# Patient Record
Sex: Male | Born: 1960
Health system: Southern US, Community
[De-identification: ages and names within clinical notes are randomized; demographics above are authoritative.]

## PROBLEM LIST (undated history)

## (undated) DIAGNOSIS — B1920 Unspecified viral hepatitis C without hepatic coma: Secondary | ICD-10-CM

## (undated) DIAGNOSIS — I10 Essential (primary) hypertension: Secondary | ICD-10-CM

## (undated) DIAGNOSIS — E119 Type 2 diabetes mellitus without complications: Secondary | ICD-10-CM

## (undated) DIAGNOSIS — F329 Major depressive disorder, single episode, unspecified: Secondary | ICD-10-CM

## (undated) DIAGNOSIS — E785 Hyperlipidemia, unspecified: Secondary | ICD-10-CM

## (undated) DIAGNOSIS — F32A Depression, unspecified: Secondary | ICD-10-CM

## (undated) DIAGNOSIS — Z87442 Personal history of urinary calculi: Secondary | ICD-10-CM

## (undated) DIAGNOSIS — M545 Low back pain: Secondary | ICD-10-CM

## (undated) HISTORY — PX: SHOULDER SURGERY: SHX246

## (undated) HISTORY — DX: Unspecified viral hepatitis C without hepatic coma: B19.20

## (undated) HISTORY — DX: Type 2 diabetes mellitus without complications: E11.9

## (undated) HISTORY — PX: APPENDECTOMY: SHX54

## (undated) HISTORY — DX: Low back pain: M54.5

## (undated) HISTORY — DX: Hyperlipidemia, unspecified: E78.5

## (undated) HISTORY — DX: Depression, unspecified: F32.A

## (undated) HISTORY — PX: BACK SURGERY: SHX140

## (undated) HISTORY — DX: Major depressive disorder, single episode, unspecified: F32.9

## (undated) HISTORY — DX: Personal history of urinary calculi: Z87.442

## (undated) HISTORY — DX: Essential (primary) hypertension: I10

---

## 1999-01-09 ENCOUNTER — Ambulatory Visit (HOSPITAL_BASED_OUTPATIENT_CLINIC_OR_DEPARTMENT_OTHER): Admission: RE | Admit: 1999-01-09 | Discharge: 1999-01-09 | Payer: Self-pay | Admitting: Surgery

## 2002-09-14 ENCOUNTER — Encounter (INDEPENDENT_AMBULATORY_CARE_PROVIDER_SITE_OTHER): Payer: Self-pay | Admitting: *Deleted

## 2002-09-14 ENCOUNTER — Ambulatory Visit (HOSPITAL_COMMUNITY): Admission: RE | Admit: 2002-09-14 | Discharge: 2002-09-14 | Payer: Self-pay | Admitting: *Deleted

## 2004-07-24 ENCOUNTER — Ambulatory Visit (HOSPITAL_BASED_OUTPATIENT_CLINIC_OR_DEPARTMENT_OTHER): Admission: RE | Admit: 2004-07-24 | Discharge: 2004-07-24 | Payer: Self-pay | Admitting: Orthopedic Surgery

## 2004-07-24 ENCOUNTER — Ambulatory Visit (HOSPITAL_COMMUNITY): Admission: RE | Admit: 2004-07-24 | Discharge: 2004-07-24 | Payer: Self-pay | Admitting: Orthopedic Surgery

## 2006-07-04 ENCOUNTER — Ambulatory Visit: Payer: Self-pay | Admitting: Internal Medicine

## 2007-03-16 DIAGNOSIS — M545 Low back pain, unspecified: Secondary | ICD-10-CM

## 2007-03-16 HISTORY — DX: Low back pain, unspecified: M54.50

## 2007-06-17 ENCOUNTER — Emergency Department (HOSPITAL_COMMUNITY): Admission: EM | Admit: 2007-06-17 | Discharge: 2007-06-17 | Payer: Self-pay | Admitting: Emergency Medicine

## 2007-06-19 ENCOUNTER — Ambulatory Visit: Payer: Self-pay | Admitting: Internal Medicine

## 2007-06-19 DIAGNOSIS — M79609 Pain in unspecified limb: Secondary | ICD-10-CM

## 2007-06-19 LAB — CONVERTED CEMR LAB
Basophils Relative: 0 % (ref 0.0–1.0)
CO2: 29 meq/L (ref 19–32)
Calcium: 9.1 mg/dL (ref 8.4–10.5)
Creatinine, Ser: 1.1 mg/dL (ref 0.4–1.5)
Eosinophils Relative: 3 % (ref 0.0–5.0)
GFR calc non Af Amer: 76 mL/min
Glucose, Bld: 115 mg/dL — ABNORMAL HIGH (ref 70–99)
Hemoglobin: 16.9 g/dL (ref 13.0–17.0)
Ketones, ur: NEGATIVE mg/dL
Leukocytes, UA: NEGATIVE
Lymphocytes Relative: 25.5 % (ref 12.0–46.0)
Monocytes Relative: 3 % (ref 3.0–12.0)
Neutro Abs: 8.6 10*3/uL — ABNORMAL HIGH (ref 1.4–7.7)
Nitrite: NEGATIVE
RBC: 5.33 M/uL (ref 4.22–5.81)
Specific Gravity, Urine: <=1.005 (ref 1.000–1.03)
WBC: 12.6 10*3/uL — ABNORMAL HIGH (ref 4.5–10.5)
pH: 6 (ref 5.0–8.0)

## 2007-06-27 ENCOUNTER — Ambulatory Visit: Payer: Self-pay | Admitting: Cardiology

## 2007-06-28 ENCOUNTER — Ambulatory Visit: Payer: Self-pay | Admitting: Internal Medicine

## 2007-06-28 DIAGNOSIS — M545 Low back pain: Secondary | ICD-10-CM

## 2007-07-05 ENCOUNTER — Encounter: Admission: RE | Admit: 2007-07-05 | Discharge: 2007-07-05 | Payer: Self-pay | Admitting: Internal Medicine

## 2007-07-12 ENCOUNTER — Ambulatory Visit: Payer: Self-pay | Admitting: Internal Medicine

## 2007-07-28 ENCOUNTER — Ambulatory Visit: Payer: Self-pay | Admitting: Internal Medicine

## 2007-08-14 ENCOUNTER — Telehealth: Payer: Self-pay | Admitting: Internal Medicine

## 2007-08-15 ENCOUNTER — Ambulatory Visit: Payer: Self-pay | Admitting: Internal Medicine

## 2007-08-15 LAB — CONVERTED CEMR LAB
Ketones, ur: NEGATIVE mg/dL
Leukocytes, UA: NEGATIVE
Nitrite: NEGATIVE
Specific Gravity, Urine: <=1.005 (ref 1.000–1.03)
Urobilinogen, UA: 0.2 (ref 0.0–1.0)

## 2007-08-17 LAB — CONVERTED CEMR LAB
Creatinine,U: 24.9 mg/dL
Opiate Screen, Urine: NEGATIVE
Phencyclidine (PCP): NEGATIVE
Propoxyphene: NEGATIVE

## 2007-08-22 ENCOUNTER — Encounter: Payer: Self-pay | Admitting: Internal Medicine

## 2007-12-04 ENCOUNTER — Ambulatory Visit: Payer: Self-pay | Admitting: Internal Medicine

## 2007-12-04 DIAGNOSIS — R05 Cough: Secondary | ICD-10-CM

## 2007-12-05 ENCOUNTER — Encounter: Payer: Self-pay | Admitting: Internal Medicine

## 2008-04-24 ENCOUNTER — Encounter: Payer: Self-pay | Admitting: Internal Medicine

## 2008-04-24 ENCOUNTER — Ambulatory Visit: Payer: Self-pay | Admitting: Internal Medicine

## 2008-04-24 DIAGNOSIS — D485 Neoplasm of uncertain behavior of skin: Secondary | ICD-10-CM

## 2009-01-29 ENCOUNTER — Ambulatory Visit: Payer: Self-pay | Admitting: Internal Medicine

## 2009-01-29 DIAGNOSIS — R350 Frequency of micturition: Secondary | ICD-10-CM

## 2009-01-29 DIAGNOSIS — N320 Bladder-neck obstruction: Secondary | ICD-10-CM

## 2009-01-29 LAB — CONVERTED CEMR LAB: Blood Glucose, Fingerstick: 113

## 2009-01-30 LAB — CONVERTED CEMR LAB
BUN: 8 mg/dL (ref 6–23)
Bilirubin Urine: NEGATIVE
Creatinine, Ser: 0.8 mg/dL (ref 0.4–1.5)
GFR calc non Af Amer: 108.39 mL/min (ref >60)
Hemoglobin, Urine: NEGATIVE
Leukocytes, UA: NEGATIVE
Nitrite: NEGATIVE
Potassium: 3.9 meq/L (ref 3.5–5.1)
pH: 6.5 (ref 5.0–8.0)

## 2009-03-21 ENCOUNTER — Encounter: Payer: Self-pay | Admitting: Internal Medicine

## 2009-04-07 IMAGING — CR DG CHEST 2V
2 series · 2 of 2 positions shown · non-contrast
Comparison: none

CLINICAL DATA: Cough, short of breath.
 CHEST - 2 VIEW:
 No comparison.

[view not recorded (1 of 2)]
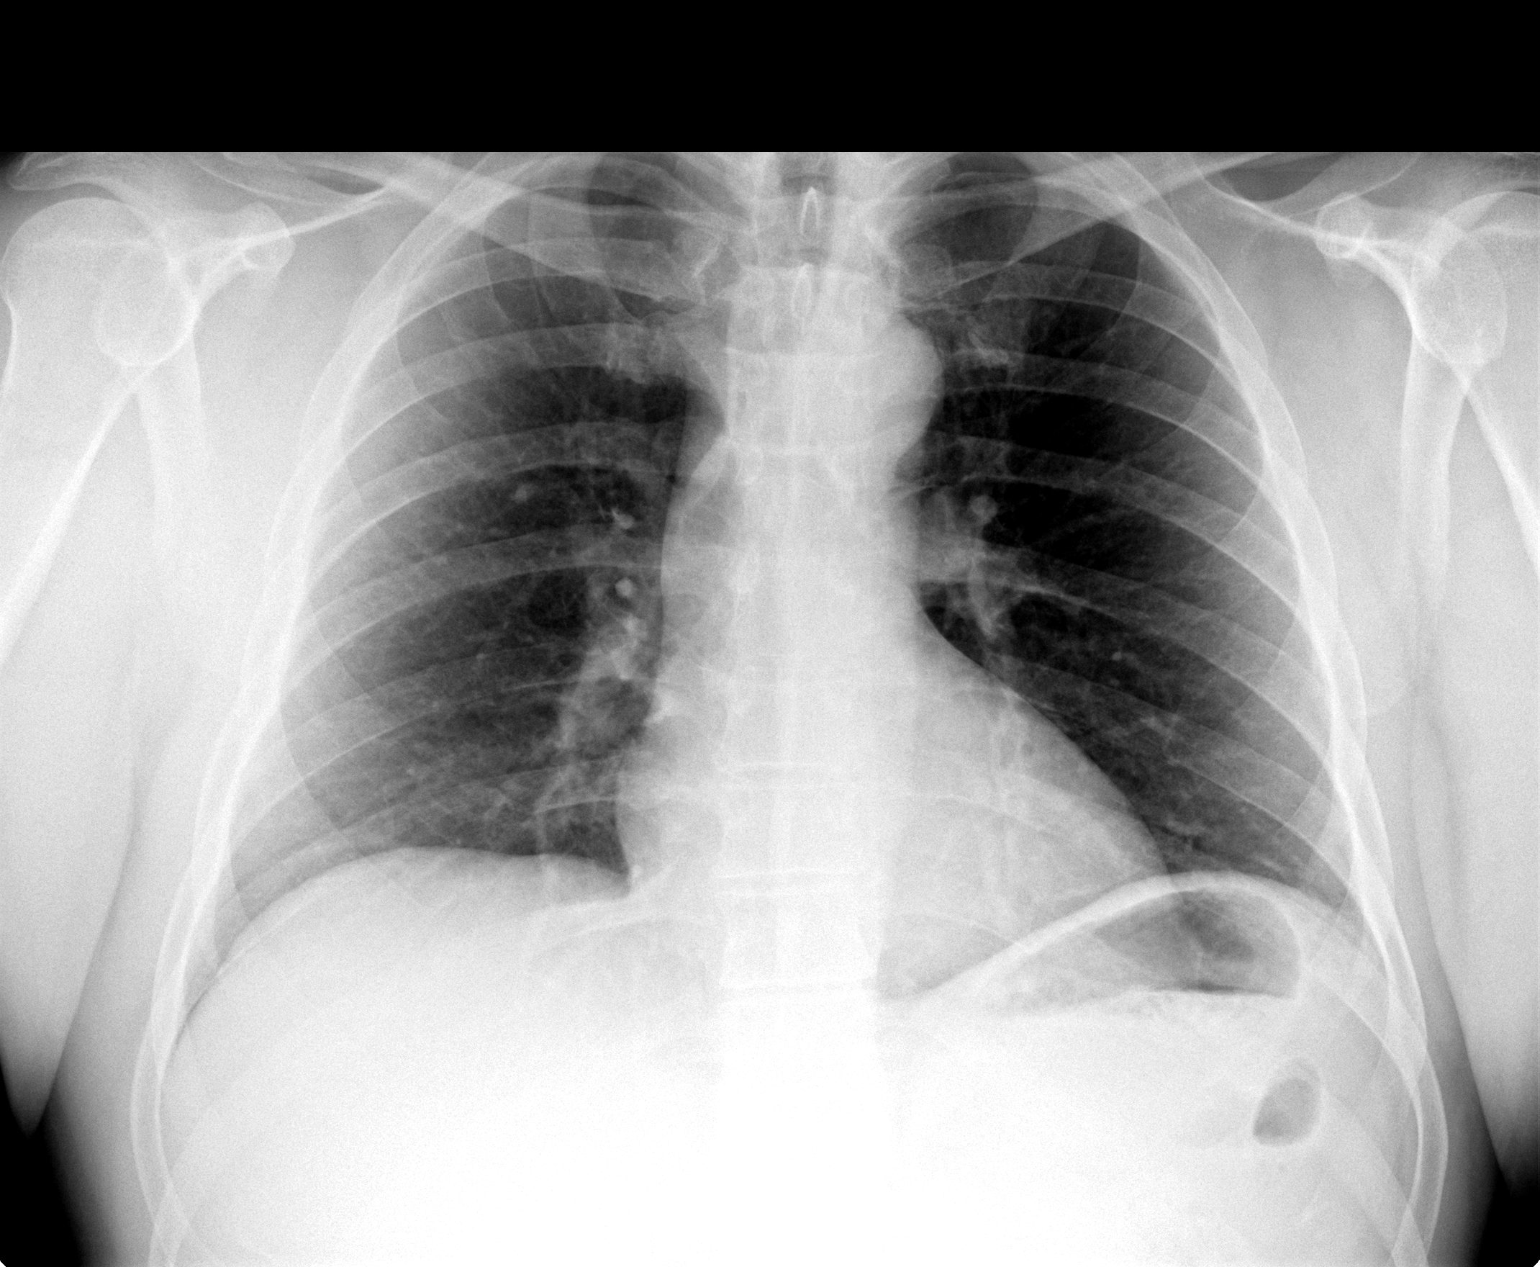

[view not recorded (2 of 2)]
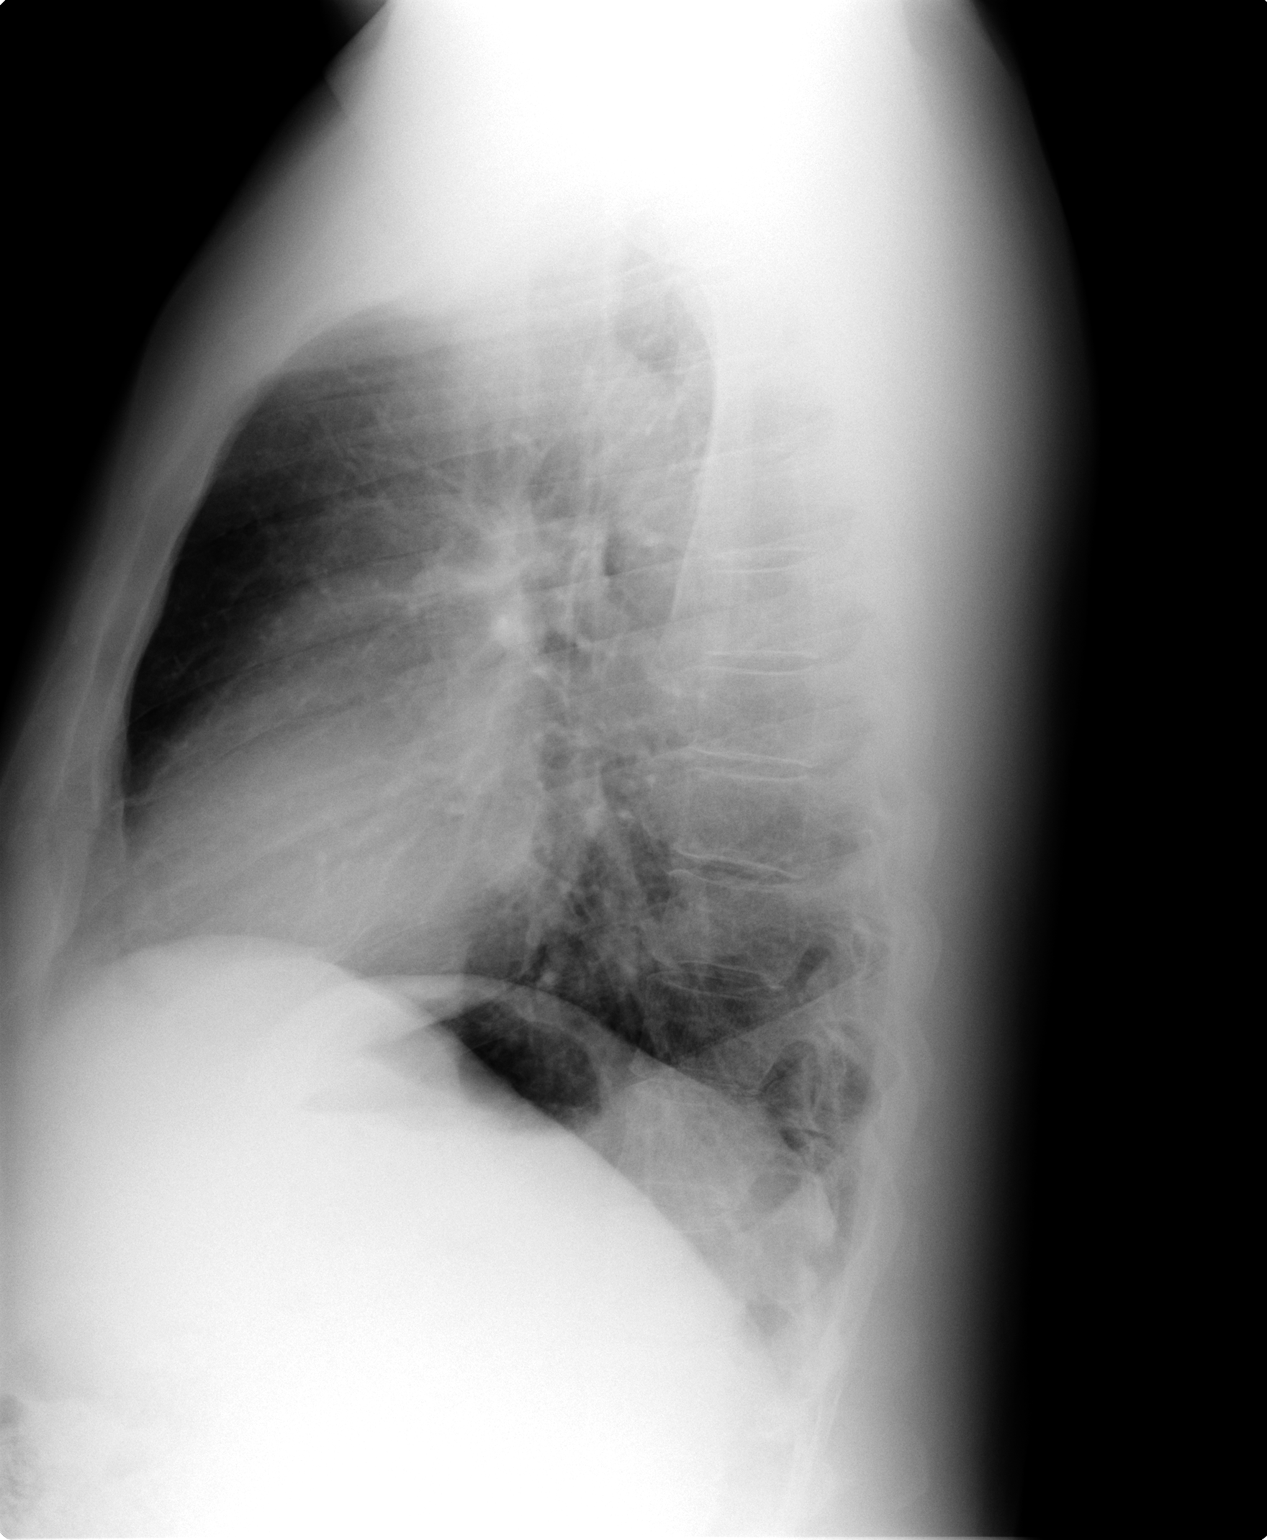

[2 of 2 positions shown; findings below may reference images not displayed]

FINDINGS: The heart size and mediastinal contours are within normal limits.  Both lungs are clear.  The visualized skeletal structures are within normal limits.
IMPRESSION: No active cardiopulmonary disease.

## 2009-05-02 ENCOUNTER — Telehealth (INDEPENDENT_AMBULATORY_CARE_PROVIDER_SITE_OTHER): Payer: Self-pay | Admitting: *Deleted

## 2009-10-02 ENCOUNTER — Ambulatory Visit: Payer: Self-pay | Admitting: Family Medicine

## 2009-10-13 ENCOUNTER — Ambulatory Visit (HOSPITAL_COMMUNITY): Admission: RE | Admit: 2009-10-13 | Discharge: 2009-10-13 | Payer: Self-pay | Admitting: Family Medicine

## 2010-03-04 ENCOUNTER — Ambulatory Visit: Payer: Self-pay | Admitting: Internal Medicine

## 2010-04-14 NOTE — Progress Notes (Signed)
Summary: Records request  Records request received from[facility] via the fax machine. Forwarded to [facility] for processing. 

## 2010-04-14 NOTE — Letter (Signed)
Summary: [facility]   Imported By: Lester Will 04/02/2009 08:43:17  _____________________________________________________________________  External Attachment:    Type:   Image     Comment:   External Document

## 2010-07-31 NOTE — Assessment & Plan Note (Signed)
[facility]                           PRIMARY CARE OFFICE NOTE   NAME:Ethan Ellis, Ethan Ellis                       MRN:          [id number]  DATE:07/04/2006                            DOB:          13-Jun-1960    The patient is a 50 year old new patient who presents with his wife [name]  to establish primary with me. He complains of being sick with a  respiratory illness since Easter Sunday. There has been a lot of cough,  congestion, chest pain with coughing, shortness of breath. He was  treated at Urgent Care with Augmentin, but he finished last Tuesday  Vicodin and he is not any better.   ALLERGIES:  ALEVE AND NAPROSYN.   SOCIAL HISTORY:  He smokes one pack a day. He works for [employer]. Limited alcohol. He is married.   FAMILY HISTORY:  Negative for asthma.   PAST MEDICAL HISTORY:  Unremarkable except for a history of asthma as a  child.   REVIEW OF SYSTEMS:  No weight loss. No hemoptysis. No nausea, vomiting  or diarrhea. No weight loss. No skin rashes. The rest of an 18-point  review of systems is negative.   PHYSICAL EXAMINATION:  Blood pressure 129/86, pulse 73, temperature  99.3, weight 223 pounds. He is in no acute distress, coughing all the  time.  HEENT: With erythematous nasal mucosa and throat. There was a large  amount of wax in the left ear. It was removed with a loop.  NECK: Supple. No thyromegaly or bruit.  LUNGS:  With bilateral rhonchi and crackles at bases.  HEART: S1, S2. Regular.  No gallop.  ABDOMEN: Soft and nontender. No organomegaly or mass felt.  EXTREMITIES: Lower extremities without edema. Calves nontender.  NEURO: He is alert and cooperative. Denies being depressed.   LABORATORY DATA:  EKG was normal sinus rhythm.   ASSESSMENT/PLAN:  1. Possible pneumonia, refractory to treatment. Obtain chest x-ray,      prescribed Avelox 400 mg daily for 10 days.  2. Asthmatic bronchitis, exacerbated by problem #1.  Prednisone 40 mg      daily for five days and      20  mg daily for 5 days, 10 mg daily for 5 days. He will take over-      the-counter Prilosec two a      day with it. Prescribed albuterol 4 mg p.o. b.i.d. and Tussionex 5      cc b.i.d. p.r.n.  3. Wax in the left ear, removed with a loop.     Georgina Quint. Plotnikov, MD  Electronically Signed    AVP/MedQ  DD: 07/05/2006  DT: 07/05/2006  Job #: [id number]

## 2010-07-31 NOTE — Op Note (Signed)
Ethan Ellis, Ethan Ellis                ACCOUNT NO.:  [id number]   MEDICAL RECORD NO.:  [id number]          PATIENT TYPE:  AMB   LOCATION:  DSC                          FACILITY:  [facility]   PHYSICIAN:  Feliberto Gottron. Turner Daniels, M.D.   DATE OF BIRTH:  28-Jan-1961   DATE OF PROCEDURE:  07/24/2004  DATE OF DISCHARGE:                                 OPERATIVE REPORT   PREOPERATIVE DIAGNOSIS:  Left radial tunnel syndrome.   POSTOPERATIVE DIAGNOSIS:  Left radial tunnel syndrome.   PROCEDURE:  Left radial tunnel release.   SURGEON:  Feliberto Gottron. Turner Daniels, M.D.   FIRST ASSISTANT:  None.   ANESTHETIC:  General endotracheal.   ESTIMATED BLOOD LOSS:  Minimal.   FLUID REPLACEMENT:  800 mL of crystalloid.   DRAINS PLACED:  None.   TOURNIQUET TIME:  Forty minutes.   INDICATIONS FOR PROCEDURE:  [age]-year-old man followed for a number of  months for left upper extremity pain that did not have a good diagnosis  despite MRI scanning of the neck and I believe the shoulder.  Finally, EMGs  showed an isolated finding of a left radial tunnel syndrome and although the  pain is atypical, it does radiated up and down the arm, it seems be centered  the elbow and he is quite tender over the distal aspect of the radial tunnel  to palpation and does seem to have a positive Tinel's sign at times.  These  findings have been somewhat intermittent, but the EMG findings is definitely  a hard finding.  Because of persistent pain that has failed conservative  treatment, he desires elective radial tunnel exploration and release.  Risks  and benefits are well-understood by the patient and his family.   DESCRIPTION OF PROCEDURE:  The patient was identified by armband and taken  the operating room at [facility].  Appropriate anesthetic  monitors were attached and general endotracheal anesthesia induced with the  patient in the supine position, tourniquet applied high to the left upper  extremity, which was then  prepped and draped in the usual sterile fashion  from the fingertips to the tourniquet.  Starting at the lateral epicondyle  and going distally, a longitudinal incision was then made through the skin  and subcutaneous tissue down to the fascia over the mobile wad of three.  We  split the interval between the brachial radialis and the ECRB to explore the  distal aspect of the radial tunnel, found were it emerged from the supinator  and carefully dissected around it, removing some what seemed to be rather  stiff fibrous bands as it came through the supinator.  It was definitely the  radial nerve when we stimulated it; the muscles fired.  As it came out of  the radial tunnel, it was noted be somewhat bulbous, probably a little bit  more than normal.  Using a small snap, we then explored up the radial tunnel  through the supinator to make sure there was no compression there.  We then  went between the interval of the pronator teres and identified the nerve as  it came down towards the radial tunnel and found no compressing lesions  there.  Satisfied with the decompression, the tourniquet was let down; small  bleeders were then identified and cauterized with the bipolar  electrocautery.  The wound was irrigated out normal saline solution.  The  longitudinal splits that we placed in the fascia were not closed and left  open so as not to compress the nerve.  The subcutaneous tissue was closed  with running 3-0 Vicryl suture and the skin with running interlocking 4-0  nylon suture.  A soft dressing of Xeroform, 4x4 dressing sponges, Webril and  an Ace wrap were then applied.  The patient was placed a standard sling,  awakened and taken to the recovery room without difficulty.      FJR/MEDQ  D:  07/24/2004  T:  07/24/2004  Job:  [id number]

## 2010-12-08 ENCOUNTER — Other Ambulatory Visit (INDEPENDENT_AMBULATORY_CARE_PROVIDER_SITE_OTHER): Payer: Medicare Other

## 2010-12-08 ENCOUNTER — Encounter: Payer: Self-pay | Admitting: Internal Medicine

## 2010-12-08 ENCOUNTER — Ambulatory Visit (INDEPENDENT_AMBULATORY_CARE_PROVIDER_SITE_OTHER): Payer: Medicare Other | Admitting: Internal Medicine

## 2010-12-08 VITALS — BP 100/70 | HR 80 | Temp 98.5°F | Resp 16 | Wt 196.0 lb

## 2010-12-08 DIAGNOSIS — M79609 Pain in unspecified limb: Secondary | ICD-10-CM

## 2010-12-08 DIAGNOSIS — R202 Paresthesia of skin: Secondary | ICD-10-CM

## 2010-12-08 DIAGNOSIS — R209 Unspecified disturbances of skin sensation: Secondary | ICD-10-CM

## 2010-12-08 DIAGNOSIS — M545 Low back pain: Secondary | ICD-10-CM

## 2010-12-08 LAB — COMPREHENSIVE METABOLIC PANEL
AST: 15 U/L (ref 0–37)
Alkaline Phosphatase: 88 U/L (ref 39–117)
BUN: 8 mg/dL (ref 6–23)
Creatinine, Ser: 0.8 mg/dL (ref 0.4–1.5)
Total Bilirubin: 0.8 mg/dL (ref 0.3–1.2)

## 2010-12-08 LAB — CBC WITH DIFFERENTIAL/PLATELET
Basophils Relative: 0.3 % (ref 0.0–3.0)
Eosinophils Absolute: 0.2 10*3/uL (ref 0.0–0.7)
Lymphs Abs: 2.9 10*3/uL (ref 0.7–4.0)
MCHC: 33.3 g/dL (ref 30.0–36.0)
MCV: 96.8 fl (ref 78.0–100.0)
Monocytes Absolute: 1 10*3/uL (ref 0.1–1.0)
Neutrophils Relative %: 70.5 % (ref 43.0–77.0)
RBC: 5.24 Mil/uL (ref 4.22–5.81)

## 2010-12-08 LAB — VITAMIN B12: Vitamin B-12: 220 pg/mL (ref 211–911)

## 2010-12-08 LAB — BASIC METABOLIC PANEL
BUN: 8 mg/dL (ref 6–23)
Chloride: 105 mEq/L (ref 96–112)
Glucose, Bld: 83 mg/dL (ref 70–99)
Potassium: 4.1 mEq/L (ref 3.5–5.1)

## 2010-12-08 MED ORDER — GABAPENTIN 300 MG PO CAPS: 300.0000 mg | ORAL_CAPSULE | Freq: Four times a day (QID) | ORAL | Status: DC

## 2010-12-08 MED ORDER — OXYCODONE-ACETAMINOPHEN 10-325 MG PO TABS: 1.0000 | ORAL_TABLET | Freq: Three times a day (TID) | ORAL | Status: DC | PRN

## 2010-12-08 MED ORDER — OXYCODONE HCL 40 MG PO TB12: 40.0000 mg | ORAL_TABLET | Freq: Two times a day (BID) | ORAL | Status: DC
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
## 2010-12-09 LAB — DRUG SCREEN, URINE
Amphetamine Screen, Ur: POSITIVE — AB
Barbiturate Quant, Ur: NEGATIVE
Benzodiazepines.: NEGATIVE
Cocaine Metabolites: NEGATIVE
Creatinine,U: 292 mg/dL
Marijuana Metabolite: POSITIVE — AB
Methadone: NEGATIVE
Opiates: POSITIVE — AB
Phencyclidine (PCP): NEGATIVE
Propoxyphene: NEGATIVE

## 2010-12-10 ENCOUNTER — Telehealth: Payer: Self-pay | Admitting: Internal Medicine

## 2010-12-14 ENCOUNTER — Encounter: Payer: Self-pay | Admitting: Internal Medicine

## 2010-12-14 NOTE — Assessment & Plan Note (Signed)
Check labs 
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
## 2010-12-14 NOTE — Assessment & Plan Note (Signed)
See Meds 

## 2010-12-22 ENCOUNTER — Ambulatory Visit: Payer: Medicare Other | Admitting: Internal Medicine

## 2010-12-25 ENCOUNTER — Other Ambulatory Visit: Payer: Self-pay | Admitting: *Deleted

## 2010-12-25 ENCOUNTER — Other Ambulatory Visit: Payer: Medicare Other

## 2010-12-25 DIAGNOSIS — Z79899 Other long term (current) drug therapy: Secondary | ICD-10-CM

## 2010-12-26 LAB — DRUG SCREEN, URINE
Amphetamine Screen, Ur: NEGATIVE
Barbiturate Quant, Ur: NEGATIVE
Marijuana Metabolite: NEGATIVE
Methadone: NEGATIVE
Opiates: POSITIVE — AB

## 2010-12-28 ENCOUNTER — Telehealth: Payer: Self-pay | Admitting: *Deleted

## 2010-12-28 NOTE — Telephone Encounter (Signed)
Wife left Vm - she is req results of drug test and wants to know if patient can get his Rx's from Dr Posey Rea.

## 2010-12-29 ENCOUNTER — Other Ambulatory Visit: Payer: Self-pay | Admitting: *Deleted

## 2010-12-29 ENCOUNTER — Telehealth: Payer: Self-pay | Admitting: Internal Medicine

## 2010-12-29 MED ORDER — OXYCODONE HCL 40 MG PO TB12: 40.0000 mg | ORAL_TABLET | Freq: Two times a day (BID) | ORAL | Status: DC

## 2010-12-29 MED ORDER — OXYCODONE-ACETAMINOPHEN 10-325 MG PO TABS: 1.0000 | ORAL_TABLET | Freq: Three times a day (TID) | ORAL | Status: DC | PRN

## 2010-12-29 MED ORDER — GABAPENTIN 300 MG PO CAPS: 300.0000 mg | ORAL_CAPSULE | Freq: Four times a day (QID) | ORAL | Status: DC

## 2010-12-29 NOTE — Telephone Encounter (Signed)
Pt informed Rxs ready for p/u.

## 2010-12-29 NOTE — Telephone Encounter (Signed)
OK to fill this prescriptions for oxycod and oxycont with additional refills x0. RTC 1 mo Thank you!

## 2010-12-29 NOTE — Telephone Encounter (Signed)
done

## 2010-12-29 NOTE — Telephone Encounter (Signed)
See other note Thx 

## 2010-12-29 NOTE — Telephone Encounter (Signed)
Rxs printed for MD sig in Dr. Loren Racer absence.

## 2010-12-30 ENCOUNTER — Ambulatory Visit (INDEPENDENT_AMBULATORY_CARE_PROVIDER_SITE_OTHER): Payer: Medicare Other | Admitting: Internal Medicine

## 2010-12-30 ENCOUNTER — Encounter: Payer: Self-pay | Admitting: Internal Medicine

## 2010-12-30 ENCOUNTER — Other Ambulatory Visit (INDEPENDENT_AMBULATORY_CARE_PROVIDER_SITE_OTHER): Payer: Medicare Other

## 2010-12-30 ENCOUNTER — Ambulatory Visit (INDEPENDENT_AMBULATORY_CARE_PROVIDER_SITE_OTHER)
Admission: RE | Admit: 2010-12-30 | Discharge: 2010-12-30 | Disposition: A | Discharge status: Home / Self Care | Payer: Medicare Other | Source: Ambulatory Visit | Attending: Internal Medicine | Admitting: Internal Medicine

## 2010-12-30 VITALS — BP 110/78 | HR 79 | Temp 97.4°F | Ht 66.0 in | Wt 199.0 lb

## 2010-12-30 DIAGNOSIS — J209 Acute bronchitis, unspecified: Secondary | ICD-10-CM

## 2010-12-30 DIAGNOSIS — R5383 Other fatigue: Secondary | ICD-10-CM | POA: Insufficient documentation

## 2010-12-30 DIAGNOSIS — R5381 Other malaise: Secondary | ICD-10-CM

## 2010-12-30 DIAGNOSIS — F17200 Nicotine dependence, unspecified, uncomplicated: Secondary | ICD-10-CM

## 2010-12-30 LAB — CBC WITH DIFFERENTIAL/PLATELET
Basophils Absolute: 0.1 10*3/uL (ref 0.0–0.1)
Eosinophils Absolute: 0.4 10*3/uL (ref 0.0–0.7)
Lymphs Abs: 2.9 10*3/uL (ref 0.7–4.0)
MCHC: 34.1 g/dL (ref 30.0–36.0)
MCV: 94.8 fl (ref 78.0–100.0)
Monocytes Absolute: 0.7 10*3/uL (ref 0.1–1.0)
Neutrophils Relative %: 69.1 % (ref 43.0–77.0)
Platelets: 270 10*3/uL (ref 150.0–400.0)
RDW: 12 % (ref 11.5–14.6)
WBC: 13.1 10*3/uL — ABNORMAL HIGH (ref 4.5–10.5)

## 2010-12-30 MED ORDER — CEFTRIAXONE SODIUM 1 G IJ SOLR
1.0000 g | Freq: Once | INTRAMUSCULAR | Status: AC
  Administered 2010-12-30: 1 g via INTRAMUSCULAR

## 2010-12-30 MED ORDER — LEVOFLOXACIN 500 MG PO TABS: 500.0000 mg | ORAL_TABLET | Freq: Every day | ORAL | Status: AC

## 2010-12-30 MED ORDER — HYDROCODONE-HOMATROPINE 5-1.5 MG/5ML PO SYRP: 5.0000 mL | ORAL_SOLUTION | Freq: Four times a day (QID) | ORAL | Status: AC | PRN

## 2010-12-30 NOTE — Progress Notes (Signed)
Quick Note:  Voice message left on [employer] system - lab neg for acute ______

## 2010-12-30 NOTE — Patient Instructions (Signed)
Take all new medications as prescribed Continue all other medications as before Please go to XRAY in the Basement for the x-ray test Please go to LAB in the Basement for the blood and/or urine tests to be done today Please call the phone number [phone] (the [employer] System) for results of testing in 2-3 days;  When calling, simply dial the number, and when prompted enter the MRN number above (the Medical Record Number) and the # key, then the message should start.

## 2010-12-30 NOTE — Assessment & Plan Note (Signed)
Counseled on quitting, declines specific tx at this time

## 2010-12-30 NOTE — Assessment & Plan Note (Signed)
Etiology unclear, Exam otherwise benign, to check labs as documented, follow with expectant management, most likely related to current illness

## 2010-12-30 NOTE — Progress Notes (Signed)
  Subjective:    Patient ID: Ethan Ellis, male    DOB: [birth year], 50 y.o.   MRN: [id number]  HPI  Here with acute onset mild to mod 2-3 days ST, HA, general weakness and malaise, with prod cough greenish sputum, but Pt denies chest pain, increased sob or doe, wheezing, orthopnea, PND, increased LE swelling, palpitations, dizziness or syncope, except for wheeze and sob with cough only.  Does have sense of ongoing fatigue, but denies signficant hypersomnolence.  Pt denies new neurological symptoms such as new headache, or facial or extremity weakness or numbness.   Pt denies polydipsia, polyuria.  Still smoking - not ready to try quitting, though cant smoke at the moment due to illness and even more irritable today Past Medical History  Diagnosis Date  . LBP (low back pain) 2009    Right   No past surgical history on file.  reports that he has been smoking.  He does not have any smokeless tobacco history on file. He reports that he uses illicit drugs (Marijuana). He reports that he does not drink alcohol. family history includes Diabetes in his father. Allergies  Allergen Reactions  . Naproxen     REACTION: mouth swelling   Current Outpatient Prescriptions on File Prior to Visit  Medication Sig Dispense Refill  . Cholecalciferol 1000 UNITS tablet Take 1,000 Units by mouth daily.        Marland Kitchen gabapentin (NEURONTIN) 300 MG capsule Take 1 capsule (300 mg total) by mouth 4 (four) times daily.  60 capsule  0  . oxyCODONE (OXYCONTIN) 40 MG 12 hr tablet Take 1 tablet (40 mg total) by mouth every 12 (twelve) hours.  30 tablet  0  . oxyCODONE-acetaminophen (PERCOCET) 10-325 MG per tablet Take 1 tablet by mouth every 8 (eight) hours as needed.  45 tablet  0  . silodosin (RAPAFLO) 8 MG CAPS capsule Take 8 mg by mouth daily with breakfast.         No current facility-administered medications on file prior to visit.   Review of Systems Review of Systems  Constitutional: Negative for diaphoresis and  unexpected weight change.  HENT: Negative for drooling and tinnitus.   Eyes: Negative for photophobia and visual disturbance.  Respiratory: Negative for choking and stridor.   Gastrointestinal: Negative for vomiting and blood in stool.  Genitourinary: Negative for hematuria and decreased urine volume.       Objective:   Physical Exam BP 110/78  Pulse 79  Temp(Src) 97.4 F (36.3 C) (Oral)  Ht 5\' 6"  (1.676 m)  Wt 199 lb (90.266 kg)  BMI 32.12 kg/m2  SpO2 95% Physical Exam  VS noted, mild ill appearing Constitutional: Pt appears well-developed and well-nourished.  HENT: Head: Normocephalic.  Right Ear: External ear normal.  Left Ear: External ear normal.  Eyes: Conjunctivae and EOM are normal. Pupils are equal, round, and reactive to light.  Neck: Normal range of motion. Neck supple. Bilat submand tender LA noted  Cardiovascular: Normal rate and regular rhythm.   Pulmonary/Chest: Effort normal and breath sounds decreased bilat, few RLL rales noted, no wheeze  Neurological: Pt is alert. No cranial nerve deficit.  Skin: Skin is warm. No erythema.  Psychiatric: Pt behavior is normal. Thought content normal. 1+ nervous    Assessment & Plan:

## 2010-12-30 NOTE — Assessment & Plan Note (Signed)
Mod to severe, ? Cant r/o RLL pna, for cxr/cbc, rocephin IM in the office today, and levaquin/cough med for home;  No steroid needed at this time; encouraged to quit smoking

## 2011-01-13 ENCOUNTER — Telehealth: Payer: Self-pay | Admitting: *Deleted

## 2011-01-13 DIAGNOSIS — Z79899 Other long term (current) drug therapy: Secondary | ICD-10-CM

## 2011-01-13 NOTE — Telephone Encounter (Signed)
Pt requesting Rf on all pain meds. He will be out in next day or two. Please advise.

## 2011-01-13 NOTE — Telephone Encounter (Signed)
He needs to do a urine drug screen first Thx

## 2011-01-14 ENCOUNTER — Other Ambulatory Visit: Payer: Medicare Other

## 2011-01-14 ENCOUNTER — Other Ambulatory Visit: Payer: Self-pay | Admitting: *Deleted

## 2011-01-14 DIAGNOSIS — Z79899 Other long term (current) drug therapy: Secondary | ICD-10-CM

## 2011-01-14 NOTE — Telephone Encounter (Signed)
Pt informed/ UDS ordered.

## 2011-01-15 ENCOUNTER — Telehealth: Payer: Self-pay | Admitting: Internal Medicine

## 2011-01-15 LAB — DRUG SCREEN, URINE
Benzodiazepines.: NEGATIVE
Creatinine,U: 29.4 mg/dL
Methadone: NEGATIVE
Phencyclidine (PCP): NEGATIVE
Propoxyphene: NEGATIVE

## 2011-01-15 NOTE — Telephone Encounter (Signed)
Pt walked in to office requesting Rxs. I spoke to him and advised per Dr. AVP: UDS is all negative which means no percocet has been taken for days or the specimen submitted was someone else's urine. I advised him Dr. Posey Rea can not refill meds at this time. I advised pt to discuss this further at 01-29-11 OV. Pt understand/agrees.

## 2011-01-15 NOTE — Telephone Encounter (Signed)
[name], please, call pt -- has he been taking pain meds daily? When did he take the last dose prior to the test? Thx

## 2011-01-29 ENCOUNTER — Ambulatory Visit (INDEPENDENT_AMBULATORY_CARE_PROVIDER_SITE_OTHER): Payer: Medicare Other | Admitting: Internal Medicine

## 2011-01-29 ENCOUNTER — Encounter: Payer: Self-pay | Admitting: Internal Medicine

## 2011-01-29 DIAGNOSIS — M545 Low back pain: Secondary | ICD-10-CM

## 2011-01-29 MED ORDER — GABAPENTIN 300 MG PO CAPS: 300.0000 mg | ORAL_CAPSULE | Freq: Three times a day (TID) | ORAL | Status: DC

## 2011-01-29 MED ORDER — CYCLOBENZAPRINE HCL 10 MG PO TABS: 10.0000 mg | ORAL_TABLET | Freq: Two times a day (BID) | ORAL | Status: AC | PRN

## 2011-01-29 MED ORDER — MELOXICAM 15 MG PO TABS: 15.0000 mg | ORAL_TABLET | Freq: Every day | ORAL | Status: DC | PRN

## 2011-01-29 NOTE — Progress Notes (Signed)
  Subjective:    Patient ID: Ethan Ellis, male    DOB: April 21, 1960, 50 y.o.   MRN: [id number]  HPI  He is here with his wife Ethan Ellis to discuss his pain management situation. He has been out of Oxycodone x 3 wks and he is c/o LBP. They are asking if I could re-do his urine drug test and start prescribing Percocet again or if I could prescribe something to help him that is not a controlled med. They state that his last urine drug screen was negative because of the PCN use for his pneumonia at the time. The pt is upset...  Review of Systems  Constitutional: Positive for fatigue. Negative for fever.  Musculoskeletal: Positive for back pain.  Psychiatric/Behavioral: Positive for dysphoric mood. Negative for suicidal ideas and agitation. The patient is nervous/anxious.        Objective:   Physical Exam  Constitutional: He appears well-developed and well-nourished. No distress.  Skin: He is not diaphoretic.  Psychiatric: Judgment normal. His mood appears not anxious. His affect is angry (he is able to control his anger through the visit). His speech is not rapid and/or pressured and not slurred. He is not agitated, not aggressive and not combative. He expresses no homicidal and no suicidal ideation. He is attentive.    All his recent urine drug screen results were reviewed with the pt and his wife.      Assessment & Plan:

## 2011-01-29 NOTE — Patient Instructions (Signed)
Stop Meloxicam if you have any allergic reaction with it
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
## 2011-02-08 ENCOUNTER — Telehealth: Payer: Self-pay | Admitting: *Deleted

## 2011-02-08 NOTE — Telephone Encounter (Signed)
I'll forward it to [facility]. Corrie Dandy, they would like to see Dr Wilburn Cornelia

## 2011-02-08 NOTE — Telephone Encounter (Signed)
Pt's wife [name] left vm stating pt needs referral to [facility] to Dr. Vear Clock.

## 2011-04-22 ENCOUNTER — Other Ambulatory Visit: Payer: Self-pay | Admitting: *Deleted

## 2011-04-22 MED ORDER — MELOXICAM 15 MG PO TABS: 15.0000 mg | ORAL_TABLET | Freq: Every day | ORAL | Status: AC | PRN

## 2011-06-07 ENCOUNTER — Other Ambulatory Visit: Payer: Self-pay | Admitting: Rehabilitation

## 2011-06-07 DIAGNOSIS — M545 Low back pain: Secondary | ICD-10-CM

## 2011-06-11 ENCOUNTER — Ambulatory Visit
Admission: RE | Admit: 2011-06-11 | Discharge: 2011-06-11 | Disposition: A | Discharge status: Home / Self Care | Payer: Medicare Other | Source: Ambulatory Visit | Attending: Rehabilitation | Admitting: Rehabilitation

## 2011-06-11 DIAGNOSIS — M545 Low back pain: Secondary | ICD-10-CM

## 2011-06-11 MED ORDER — GADOBENATE DIMEGLUMINE 529 MG/ML IV SOLN
19.0000 mL | Freq: Once | INTRAVENOUS | Status: AC | PRN
  Administered 2011-06-11: 19 mL via INTRAVENOUS

## 2011-06-12 ENCOUNTER — Other Ambulatory Visit: Payer: Medicare Other

## 2011-10-04 NOTE — Telephone Encounter (Signed)
x

## 2013-03-21 DIAGNOSIS — R059 Cough, unspecified: Secondary | ICD-10-CM | POA: Diagnosis not present

## 2013-03-21 DIAGNOSIS — J209 Acute bronchitis, unspecified: Secondary | ICD-10-CM | POA: Diagnosis not present

## 2013-03-21 DIAGNOSIS — R0609 Other forms of dyspnea: Secondary | ICD-10-CM | POA: Diagnosis not present

## 2013-03-21 DIAGNOSIS — J0100 Acute maxillary sinusitis, unspecified: Secondary | ICD-10-CM | POA: Diagnosis not present

## 2013-04-25 DIAGNOSIS — M545 Low back pain, unspecified: Secondary | ICD-10-CM | POA: Diagnosis not present

## 2013-06-25 DIAGNOSIS — M545 Low back pain, unspecified: Secondary | ICD-10-CM | POA: Diagnosis not present

## 2013-06-25 DIAGNOSIS — G894 Chronic pain syndrome: Secondary | ICD-10-CM | POA: Diagnosis not present

## 2013-06-25 DIAGNOSIS — M5126 Other intervertebral disc displacement, lumbar region: Secondary | ICD-10-CM | POA: Diagnosis not present

## 2013-06-25 DIAGNOSIS — M5137 Other intervertebral disc degeneration, lumbosacral region: Secondary | ICD-10-CM | POA: Diagnosis not present

## 2013-08-23 DIAGNOSIS — B354 Tinea corporis: Secondary | ICD-10-CM | POA: Diagnosis not present

## 2013-08-23 DIAGNOSIS — L259 Unspecified contact dermatitis, unspecified cause: Secondary | ICD-10-CM | POA: Diagnosis not present

## 2013-08-27 DIAGNOSIS — G894 Chronic pain syndrome: Secondary | ICD-10-CM | POA: Diagnosis not present

## 2013-08-27 DIAGNOSIS — M545 Low back pain, unspecified: Secondary | ICD-10-CM | POA: Diagnosis not present

## 2013-09-17 DIAGNOSIS — R0609 Other forms of dyspnea: Secondary | ICD-10-CM | POA: Diagnosis not present

## 2013-09-17 DIAGNOSIS — R059 Cough, unspecified: Secondary | ICD-10-CM | POA: Diagnosis not present

## 2013-09-17 DIAGNOSIS — R05 Cough: Secondary | ICD-10-CM | POA: Diagnosis not present

## 2013-09-17 DIAGNOSIS — R0989 Other specified symptoms and signs involving the circulatory and respiratory systems: Secondary | ICD-10-CM | POA: Diagnosis not present

## 2013-09-17 DIAGNOSIS — R51 Headache: Secondary | ICD-10-CM | POA: Diagnosis not present

## 2013-10-15 DIAGNOSIS — L570 Actinic keratosis: Secondary | ICD-10-CM | POA: Diagnosis not present

## 2013-10-15 DIAGNOSIS — L259 Unspecified contact dermatitis, unspecified cause: Secondary | ICD-10-CM | POA: Diagnosis not present

## 2013-10-25 DIAGNOSIS — G894 Chronic pain syndrome: Secondary | ICD-10-CM | POA: Diagnosis not present

## 2013-10-25 DIAGNOSIS — Z79899 Other long term (current) drug therapy: Secondary | ICD-10-CM | POA: Diagnosis not present

## 2013-11-06 DIAGNOSIS — N529 Male erectile dysfunction, unspecified: Secondary | ICD-10-CM | POA: Diagnosis not present

## 2013-11-06 DIAGNOSIS — Z125 Encounter for screening for malignant neoplasm of prostate: Secondary | ICD-10-CM | POA: Diagnosis not present

## 2013-12-27 DIAGNOSIS — G894 Chronic pain syndrome: Secondary | ICD-10-CM | POA: Diagnosis not present

## 2014-02-15 DIAGNOSIS — G894 Chronic pain syndrome: Secondary | ICD-10-CM | POA: Diagnosis not present

## 2014-02-22 ENCOUNTER — Ambulatory Visit (INDEPENDENT_AMBULATORY_CARE_PROVIDER_SITE_OTHER): Payer: Medicare Other | Admitting: Internal Medicine

## 2014-02-22 ENCOUNTER — Other Ambulatory Visit (INDEPENDENT_AMBULATORY_CARE_PROVIDER_SITE_OTHER): Payer: Medicare Other

## 2014-02-22 ENCOUNTER — Encounter: Payer: Self-pay | Admitting: Internal Medicine

## 2014-02-22 VITALS — BP 140/94 | HR 105 | Temp 98.2°F | Ht 69.0 in | Wt 216.1 lb

## 2014-02-22 DIAGNOSIS — E1165 Type 2 diabetes mellitus with hyperglycemia: Secondary | ICD-10-CM | POA: Insufficient documentation

## 2014-02-22 DIAGNOSIS — R5383 Other fatigue: Secondary | ICD-10-CM

## 2014-02-22 DIAGNOSIS — I10 Essential (primary) hypertension: Secondary | ICD-10-CM | POA: Insufficient documentation

## 2014-02-22 DIAGNOSIS — E119 Type 2 diabetes mellitus without complications: Secondary | ICD-10-CM | POA: Insufficient documentation

## 2014-02-22 DIAGNOSIS — Z23 Encounter for immunization: Secondary | ICD-10-CM

## 2014-02-22 LAB — URINALYSIS, ROUTINE W REFLEX MICROSCOPIC
BILIRUBIN URINE: NEGATIVE
HGB URINE DIPSTICK: NEGATIVE
Ketones, ur: NEGATIVE
Leukocytes, UA: NEGATIVE
NITRITE: NEGATIVE
Specific Gravity, Urine: 1.015 (ref 1.000–1.030)
TOTAL PROTEIN, URINE-UPE24: NEGATIVE
UROBILINOGEN UA: 0.2 (ref 0.0–1.0)
Urine Glucose: NEGATIVE
pH: 6 (ref 5.0–8.0)

## 2014-02-22 LAB — CBC WITH DIFFERENTIAL/PLATELET
BASOS ABS: 0 10*3/uL (ref 0.0–0.1)
Basophils Relative: 0.4 % (ref 0.0–3.0)
EOS ABS: 0.4 10*3/uL (ref 0.0–0.7)
Eosinophils Relative: 2.8 % (ref 0.0–5.0)
HCT: 47.8 % (ref 39.0–52.0)
Hemoglobin: 16.3 g/dL (ref 13.0–17.0)
LYMPHS PCT: 32.2 % (ref 12.0–46.0)
Lymphs Abs: 4.3 10*3/uL — ABNORMAL HIGH (ref 0.7–4.0)
MCHC: 34.2 g/dL (ref 30.0–36.0)
MCV: 88.8 fl (ref 78.0–100.0)
MONO ABS: 0.7 10*3/uL (ref 0.1–1.0)
Monocytes Relative: 4.9 % (ref 3.0–12.0)
NEUTROS PCT: 59.7 % (ref 43.0–77.0)
Neutro Abs: 7.9 10*3/uL — ABNORMAL HIGH (ref 1.4–7.7)
PLATELETS: 259 10*3/uL (ref 150.0–400.0)
RBC: 5.38 Mil/uL (ref 4.22–5.81)
RDW: 12.4 % (ref 11.5–15.5)
WBC: 13.3 10*3/uL — ABNORMAL HIGH (ref 4.0–10.5)

## 2014-02-22 LAB — HEMOGLOBIN A1C: Hgb A1c MFr Bld: 9.5 % — ABNORMAL HIGH (ref 4.6–6.5)

## 2014-02-22 LAB — MICROALBUMIN / CREATININE URINE RATIO
CREATININE, U: 78.8 mg/dL
MICROALB UR: 3.7 mg/dL — AB (ref 0.0–1.9)
MICROALB/CREAT RATIO: 4.7 mg/g (ref 0.0–30.0)

## 2014-02-22 MED ORDER — ASPIRIN EC 81 MG PO TBEC: 81.0000 mg | DELAYED_RELEASE_TABLET | Freq: Every day | ORAL | Status: AC

## 2014-02-22 MED ORDER — VALSARTAN 160 MG PO TABS: 160.0000 mg | ORAL_TABLET | Freq: Every day | ORAL | Status: DC

## 2014-02-22 NOTE — Progress Notes (Signed)
Subjective:    Patient ID: Ethan Ellis, male    DOB: May 10, 1960, 53 y.o.   MRN: [id number]  HPI  Here for yearly f/u, although PCP is Dr Alain Marion, could not get in to see today;  Overall doing ok;  Pt denies CP, worsening SOB, DOE, wheezing, orthopnea, PND, worsening LE edema, palpitations, dizziness or syncope.  Pt denies neurological change such as new headache, facial or extremity weakness.  Pt denies polydipsia, polyuria, or low sugar symptoms. Pt states overall good compliance with treatment and medications, good tolerability, and has been trying to follow lower cholesterol diet.  Pt denies worsening depressive symptoms, suicidal ideation or panic. No fever, night sweats, wt loss, loss of appetite, or other constitutional symptoms.  Pt states good ability with ADL's, has low fall risk, home safety reviewed and adequate, no other significant changes in hearing or vision, and only occasionally active with exercise.  DId have an episode of GI distress for 24 hrs after eating out - ? Food poisoning, but resolved now for several days   Had PSA done with urology 2 mo ago per pt.  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  Has gained signficant wt in last 1-2 yrs, approx 20 lbs per pt. Does c/o ongoing fatigue, but denies signficant daytime hypersomnolence.  Past Medical History  Diagnosis Date  . LBP (low back pain) 2009    Right   No past surgical history on file.  reports that he has been smoking.  He does not have any smokeless tobacco history on file. He reports that he uses illicit drugs (Marijuana). He reports that he does not drink alcohol. family history includes Diabetes in his father. Allergies  Allergen Reactions  . Naproxen     REACTION: mouth swelling. He can take advil and some other NSAIDs w/o problems   Current Outpatient Prescriptions on File Prior to Visit  Medication Sig Dispense Refill  . Cholecalciferol 1000 UNITS tablet Take 1,000 Units by  mouth daily.      Marland Kitchen gabapentin (NEURONTIN) 300 MG capsule Take 1 capsule (300 mg total) by mouth 3 (three) times daily. 90 capsule 3  . silodosin (RAPAFLO) 8 MG CAPS capsule Take 8 mg by mouth daily with breakfast.       No current facility-administered medications on file prior to visit.    Review of Systems Constitutional: Negative for increased diaphoresis, other activity, appetite or other siginficant weight change  HENT: Negative for worsening hearing loss, ear pain, facial swelling, mouth sores and neck stiffness.   Eyes: Negative for other worsening pain, redness or visual disturbance.  Respiratory: Negative for shortness of breath and wheezing.   Cardiovascular: Negative for chest pain and palpitations.  Gastrointestinal: Negative for diarrhea, blood in stool, abdominal distention or other pain Genitourinary: Negative for hematuria, flank pain or change in urine volume.  Musculoskeletal: Negative for myalgias or other joint complaints.  Skin: Negative for color change and wound.  Neurological: Negative for syncope and numbness. other than noted Hematological: Negative for adenopathy. or other swelling Psychiatric/Behavioral: Negative for hallucinations, self-injury, decreased concentration or other worsening agitation.      Objective:   Physical Exam BP 140/94 mmHg  Pulse 105  Temp(Src) 98.2 F (36.8 C) (Oral)  Ht 5\' 9"  (1.753 m)  Wt 216 lb 2 oz (98.034 kg)  BMI 31.90 kg/m2  SpO2 96% VS noted,  Constitutional: Pt is oriented to person, place, and time. Appears well-developed and well-nourished. /  obese Head: Normocephalic and atraumatic.  Right Ear: External ear normal.  Left Ear: External ear normal.  Nose: Nose normal.  Mouth/Throat: Oropharynx is clear and moist.  Eyes: Conjunctivae and EOM are normal. Pupils are equal, round, and reactive to light.  Neck: Normal range of motion. Neck supple. No JVD present. No tracheal deviation present.  Cardiovascular: Normal  rate, regular rhythm, normal heart sounds and intact distal pulses.   Pulmonary/Chest: Effort normal and breath sounds without rales or wheezing  Abdominal: Soft. Bowel sounds are normal. NT. No HSM  Musculoskeletal: Normal range of motion. Exhibits no edema.  Lymphadenopathy:  Has no cervical adenopathy.  Neurological: Pt is alert and oriented to person, place, and time. Pt has normal reflexes. No cranial nerve deficit. Motor grossly intact Skin: Skin is warm and dry. No rash noted.  Psychiatric:  Has normal mood and affect. Behavior is normal.  Wt Readings from Last 3 Encounters:  02/22/14 216 lb 2 oz (98.034 kg)  12/30/10 199 lb (90.266 kg)  12/08/10 196 lb (88.905 kg)   BP Readings from Last 3 Encounters:  02/22/14 140/94  12/30/10 110/78  12/08/10 100/70       Assessment & Plan:

## 2014-02-22 NOTE — Progress Notes (Signed)
Pre visit review using our clinic review tool, if applicable. No additional management support is needed unless otherwise documented below in the visit note. 

## 2014-02-22 NOTE — Patient Instructions (Addendum)
Please take all new medication as prescribed - the generic diovan 160 mg per day  Please also start Aspirin 81 mg per day (enteric coated only)  Please continue all other medications as before, and refills have been done if requested.  Please have the pharmacy call with any other refills you may need.  Please continue your efforts at being more active, low cholesterol diet, and weight control.  Please keep your appointments with your specialists as you may have planned  Please go to the LAB in the Basement (turn left off the elevator) for the tests to be done today  You will be contacted by phone if any changes need to be made immediately.  Otherwise, you will receive a letter about your results with an explanation, but please check with MyChart first.  Please remember to sign up for MyChart if you have not done so, as this will be important to you in the future with finding out test results, communicating by private email, and scheduling acute appointments online when needed.  Please return in 2 weeks, or sooner if needed (the office will call)

## 2014-02-24 NOTE — Assessment & Plan Note (Signed)
Recent BS elev > 200 random per pt in the setting of overall gradual wt gain, o/w stable overall by history and exam, recent data reviewed with pt, and pt to continue medical treatment as before,  to f/u any worsening symptoms or concerns, for labs today including a1c

## 2014-02-24 NOTE — Assessment & Plan Note (Addendum)
Mild uncontrolled, o/w stable overall by history and exam, recent data reviewed with pt, and pt to continue medical treatment as before,  to f/u any worsening symptoms or concerns BP Readings from Last 3 Encounters:  02/22/14 140/94  12/30/10 110/78  12/08/10 100/70   For start diovan 160 qd,  to f/u any worsening symptoms or concerns, f/u BP at home and next visit 2 wks  Note:  Total time for pt hx, exam, review of record with pt in the room, determination of diagnoses and plan for further eval and tx is > 40 min, with over 50% spent in coordination and counseling of patient

## 2014-02-24 NOTE — Assessment & Plan Note (Signed)
Etiology unclear, Exam otherwise benign, to check labs as documented, follow with expectant management  

## 2014-02-25 ENCOUNTER — Other Ambulatory Visit: Payer: Self-pay | Admitting: Internal Medicine

## 2014-02-25 ENCOUNTER — Telehealth: Payer: Self-pay | Admitting: Internal Medicine

## 2014-02-25 ENCOUNTER — Encounter: Payer: Self-pay | Admitting: Internal Medicine

## 2014-02-25 LAB — LIPID PANEL
CHOL/HDL RATIO: 10
CHOLESTEROL: 267 mg/dL — AB (ref 0–200)
HDL: 26.7 mg/dL — ABNORMAL LOW (ref >39.00)
NONHDL: 240.3
VLDL: 122 mg/dL — ABNORMAL HIGH (ref 0.0–40.0)

## 2014-02-25 LAB — HEPATIC FUNCTION PANEL
ALT: 34 U/L (ref 0–53)
AST: 28 U/L (ref 0–37)
Albumin: 4.3 g/dL (ref 3.5–5.2)
Alkaline Phosphatase: 90 U/L (ref 39–117)
BILIRUBIN DIRECT: 0.2 mg/dL (ref 0.0–0.3)
BILIRUBIN TOTAL: 1.2 mg/dL (ref 0.2–1.2)
TOTAL PROTEIN: 7.1 g/dL (ref 6.0–8.3)

## 2014-02-25 LAB — BASIC METABOLIC PANEL
BUN: 9 mg/dL (ref 6–23)
CHLORIDE: 98 meq/L (ref 96–112)
CO2: 27 meq/L (ref 19–32)
CREATININE: 0.9 mg/dL (ref 0.4–1.5)
Calcium: 9.4 mg/dL (ref 8.4–10.5)
GFR: 91.63 mL/min (ref >60.00)
Glucose, Bld: 146 mg/dL — ABNORMAL HIGH (ref 70–99)
POTASSIUM: 4.2 meq/L (ref 3.5–5.1)
Sodium: 133 mEq/L — ABNORMAL LOW (ref 135–145)

## 2014-02-25 LAB — LDL CHOLESTEROL, DIRECT: Direct LDL: 144.7 mg/dL

## 2014-02-25 LAB — TSH: TSH: 1.04 u[IU]/mL (ref 0.35–4.50)

## 2014-02-25 MED ORDER — METFORMIN HCL ER 500 MG PO TB24: ORAL_TABLET | ORAL | Status: DC

## 2014-02-25 MED ORDER — ATORVASTATIN CALCIUM 10 MG PO TABS: 10.0000 mg | ORAL_TABLET | Freq: Every day | ORAL | Status: DC

## 2014-02-25 NOTE — Telephone Encounter (Signed)
emmi mailed  °

## 2014-02-25 NOTE — Telephone Encounter (Signed)
Error/gd °

## 2014-03-06 ENCOUNTER — Ambulatory Visit (INDEPENDENT_AMBULATORY_CARE_PROVIDER_SITE_OTHER): Payer: Medicare Other | Admitting: Internal Medicine

## 2014-03-06 ENCOUNTER — Encounter: Payer: Self-pay | Admitting: Internal Medicine

## 2014-03-06 VITALS — BP 148/98 | HR 101 | Temp 98.3°F | Ht 69.0 in | Wt 216.0 lb

## 2014-03-06 DIAGNOSIS — E119 Type 2 diabetes mellitus without complications: Secondary | ICD-10-CM | POA: Diagnosis not present

## 2014-03-06 DIAGNOSIS — E785 Hyperlipidemia, unspecified: Secondary | ICD-10-CM

## 2014-03-06 DIAGNOSIS — I10 Essential (primary) hypertension: Secondary | ICD-10-CM

## 2014-03-06 MED ORDER — VALSARTAN 320 MG PO TABS: 320.0000 mg | ORAL_TABLET | Freq: Every day | ORAL | Status: DC

## 2014-03-06 MED ORDER — METFORMIN HCL ER 500 MG PO TB24: ORAL_TABLET | ORAL | Status: DC

## 2014-03-06 MED ORDER — ONETOUCH ULTRA SYSTEM W/DEVICE KIT: 1.0000 | PACK | Freq: Once | Status: DC

## 2014-03-06 MED ORDER — GLUCOSE BLOOD VI STRP: ORAL_STRIP | Status: DC

## 2014-03-06 MED ORDER — LANCETS MISC: 1.0000 "application " | Freq: Every day | Status: DC

## 2014-03-06 NOTE — Patient Instructions (Signed)
OK to take the metformin at 1 pill twice per day (instead of the 2 in the am) as you have been doing  OK to increase the diovan (valsartan) to 320 mg per day (sent to your pharmacy)  Your diabetic glucometer and supplies were also sent to your pharmacy  Please continue all other medications as before, and refills have been done if requested.  Please have the pharmacy call with any other refills you may need..  Please keep your appointments with your specialists as you may have planned  Please return in 3 months, or sooner if needed, to Dr Alain Marion

## 2014-03-06 NOTE — Progress Notes (Signed)
Pre visit review using our clinic review tool, if applicable. No additional management support is needed unless otherwise documented below in the visit note. 

## 2014-03-06 NOTE — Progress Notes (Signed)
   Subjective:    Patient ID: Ethan Ellis, male    DOB: Jul 31, 1960, 53 y.o.   MRN: [id number]  HPI Here to f/u recent DM, elev chol and HTN; overall doing ok,  Pt denies chest pain, increased sob or doe, wheezing, orthopnea, PND, increased LE swelling, palpitations, dizziness or syncope.  Pt denies polydipsia, polyuria, or low sugar symptoms such as weakness or confusion improved with po intake.  Pt denies new neurological symptoms such as new headache, or facial or extremity weakness or numbness.   Pt states overall good compliance with meds, has been trying to follow lower cholesterol, diabetic diet, with wt overall stable,  but little exercise however. Past Medical History  Diagnosis Date  . LBP (low back pain) 2009    Right  . Hyperlipidemia 03/10/2014   No past surgical history on file.  reports that he has been smoking.  He does not have any smokeless tobacco history on file. He reports that he uses illicit drugs (Marijuana). He reports that he does not drink alcohol. family history includes Diabetes in his father. Allergies  Allergen Reactions  . Naproxen     REACTION: mouth swelling. He can take advil and some other NSAIDs w/o problems   Current Outpatient Prescriptions on File Prior to Visit  Medication Sig Dispense Refill  . aspirin EC 81 MG tablet Take 1 tablet (81 mg total) by mouth daily. 90 tablet 11  . atorvastatin (LIPITOR) 10 MG tablet Take 1 tablet (10 mg total) by mouth daily. 90 tablet 3  . Cholecalciferol 1000 UNITS tablet Take 1,000 Units by mouth daily.      Marland Kitchen gabapentin (NEURONTIN) 300 MG capsule Take 1 capsule (300 mg total) by mouth 3 (three) times daily. 90 capsule 3  . silodosin (RAPAFLO) 8 MG CAPS capsule Take 8 mg by mouth daily with breakfast.       No current facility-administered medications on file prior to visit.   Review of Systems  Constitutional: Negative for unusual diaphoresis or other sweats  HENT: Negative for ringing in ear Eyes: Negative  for double vision or worsening visual disturbance.  Respiratory: Negative for choking and stridor.   Gastrointestinal: Negative for vomiting or other signifcant bowel change Genitourinary: Negative for hematuria or decreased urine volume.  Musculoskeletal: Negative for other MSK pain or swelling Skin: Negative for color change and worsening wound.  Neurological: Negative for tremors and numbness other than noted  Psychiatric/Behavioral: Negative for decreased concentration or agitation other than above       Objective:   Physical Exam BP 148/98 mmHg  Pulse 101  Temp(Src) 98.3 F (36.8 C) (Oral)  Ht 5\' 9"  (1.753 m)  Wt 216 lb (97.977 kg)  BMI 31.88 kg/m2  SpO2 94% VS noted,  Constitutional: Pt appears well-developed, well-nourished.  HENT: Head: NCAT.  Right Ear: External ear normal.  Left Ear: External ear normal.  Eyes: . Pupils are equal, round, and reactive to light. Conjunctivae and EOM are normal Neck: Normal range of motion. Neck supple.  Cardiovascular: Normal rate and regular rhythm.   Pulmonary/Chest: Effort normal and breath sounds without rales or wheezing.  Neurological: Pt is alert. Not confused , motor grossly intact Skin: Skin is warm. No rash Psychiatric: Pt behavior is normal. No agitation.      Assessment & Plan:

## 2014-03-07 ENCOUNTER — Ambulatory Visit: Payer: Medicare Other | Admitting: Internal Medicine

## 2014-03-07 ENCOUNTER — Telehealth: Payer: Self-pay | Admitting: Internal Medicine

## 2014-03-07 NOTE — Telephone Encounter (Signed)
Patient informed of PCP instructions. 

## 2014-03-07 NOTE — Telephone Encounter (Signed)
Pt went to pick up test strips at [facility] and was $125, can another brand, ect., be called in

## 2014-03-07 NOTE — Telephone Encounter (Signed)
Sure, but let us know which brand is covered by his insurance, as this will save more problem in the future

## 2014-03-10 ENCOUNTER — Encounter: Payer: Self-pay | Admitting: Internal Medicine

## 2014-03-10 DIAGNOSIS — E785 Hyperlipidemia, unspecified: Secondary | ICD-10-CM | POA: Insufficient documentation

## 2014-03-10 HISTORY — DX: Hyperlipidemia, unspecified: E78.5

## 2014-03-10 NOTE — Assessment & Plan Note (Signed)
Uncontrolled, to increase the diovan 3210 qd,  to f/u any worsening symptoms or concerns

## 2014-03-10 NOTE — Assessment & Plan Note (Signed)
Ok for metformin bid due to some GI upset with taking 2 in am,  to f/u any worsening symptoms or concerns, for new glucometer and supplies, cont diet, decliens DM education for now, f/u lab next visit Lab Results  Component Value Date   HGBA1C 9.5* 02/22/2014

## 2014-03-10 NOTE — Assessment & Plan Note (Addendum)
Tolerating statin ok, stable overall by history and exam, recent data reviewed with pt, and pt to continue medical treatment as before,  to f/u any worsening symptoms or concerns LDL:  144 - - goal < 70

## 2014-03-12 ENCOUNTER — Other Ambulatory Visit: Payer: Self-pay

## 2014-03-12 MED ORDER — GLUCOSE BLOOD VI STRP: ORAL_STRIP | Status: DC

## 2014-03-12 MED ORDER — LANCETS MISC: Status: AC

## 2014-03-12 MED ORDER — ONETOUCH ULTRA SYSTEM W/DEVICE KIT: PACK | Status: DC

## 2014-04-16 DIAGNOSIS — Z79899 Other long term (current) drug therapy: Secondary | ICD-10-CM | POA: Diagnosis not present

## 2014-04-16 DIAGNOSIS — G894 Chronic pain syndrome: Secondary | ICD-10-CM | POA: Diagnosis not present

## 2014-04-16 DIAGNOSIS — Z79891 Long term (current) use of opiate analgesic: Secondary | ICD-10-CM | POA: Diagnosis not present

## 2014-06-05 ENCOUNTER — Ambulatory Visit (INDEPENDENT_AMBULATORY_CARE_PROVIDER_SITE_OTHER): Payer: Medicare Other | Admitting: Internal Medicine

## 2014-06-05 ENCOUNTER — Encounter: Payer: Self-pay | Admitting: Internal Medicine

## 2014-06-05 ENCOUNTER — Other Ambulatory Visit (INDEPENDENT_AMBULATORY_CARE_PROVIDER_SITE_OTHER): Payer: Medicare Other

## 2014-06-05 VITALS — BP 190/100 | HR 101 | Wt 209.0 lb

## 2014-06-05 DIAGNOSIS — IMO0002 Reserved for concepts with insufficient information to code with codable children: Secondary | ICD-10-CM

## 2014-06-05 DIAGNOSIS — E1165 Type 2 diabetes mellitus with hyperglycemia: Secondary | ICD-10-CM

## 2014-06-05 DIAGNOSIS — F17200 Nicotine dependence, unspecified, uncomplicated: Secondary | ICD-10-CM

## 2014-06-05 DIAGNOSIS — I10 Essential (primary) hypertension: Secondary | ICD-10-CM | POA: Diagnosis not present

## 2014-06-05 LAB — BASIC METABOLIC PANEL
BUN: 10 mg/dL (ref 6–23)
CALCIUM: 10.1 mg/dL (ref 8.4–10.5)
CO2: 28 mEq/L (ref 19–32)
Chloride: 103 mEq/L (ref 96–112)
Creatinine, Ser: 1 mg/dL (ref 0.40–1.50)
GFR: 82.1 mL/min (ref >60.00)
GLUCOSE: 122 mg/dL — AB (ref 70–99)
Potassium: 4.7 mEq/L (ref 3.5–5.1)
SODIUM: 139 meq/L (ref 135–145)

## 2014-06-05 LAB — HEMOGLOBIN A1C: Hgb A1c MFr Bld: 7.4 % — ABNORMAL HIGH (ref 4.6–6.5)

## 2014-06-05 MED ORDER — VALSARTAN-HYDROCHLOROTHIAZIDE 320-25 MG PO TABS: 1.0000 | ORAL_TABLET | Freq: Every day | ORAL | Status: DC

## 2014-06-05 MED ORDER — METFORMIN HCL ER 500 MG PO TB24: ORAL_TABLET | ORAL | Status: DC

## 2014-06-05 NOTE — Assessment & Plan Note (Signed)
Pt needs to quit

## 2014-06-05 NOTE — Progress Notes (Signed)
Pre visit review using our clinic review tool, if applicable. No additional management support is needed unless otherwise documented below in the visit note. 

## 2014-06-05 NOTE — Assessment & Plan Note (Signed)
Chronic  On Metformin XR

## 2014-06-05 NOTE — Assessment & Plan Note (Signed)
3/16 Changed to Diovan HCT NAS diet Wt loss

## 2014-06-05 NOTE — Progress Notes (Signed)
   Subjective:    HPI  F/u DM2 F/u HTN Diet coke 2.5 l/day Smokes 3/4 ppd  Review of Systems  Constitutional: Negative for appetite change, fatigue and unexpected weight change.  HENT: Negative for congestion, nosebleeds, sneezing, sore throat and trouble swallowing.   Eyes: Negative for itching and visual disturbance.  Respiratory: Negative for cough.   Cardiovascular: Negative for chest pain, palpitations and leg swelling.  Gastrointestinal: Negative for nausea, diarrhea, blood in stool and abdominal distention.  Genitourinary: Negative for frequency and hematuria.  Musculoskeletal: Positive for back pain. Negative for joint swelling, gait problem and neck pain.  Skin: Negative for rash.  Neurological: Negative for dizziness, tremors, speech difficulty and weakness.  Psychiatric/Behavioral: Negative for sleep disturbance, dysphoric mood and agitation. The patient is not nervous/anxious.      BP Readings from Last 3 Encounters:  06/05/14 190/100  03/06/14 148/98  02/22/14 140/94   Wt Readings from Last 3 Encounters:  06/05/14 209 lb (94.802 kg)  03/06/14 216 lb (97.977 kg)  02/22/14 216 lb 2 oz (98.034 kg)         Objective:   Physical Exam  Constitutional: He is oriented to person, place, and time. He appears well-developed. No distress.  NAD  HENT:  Mouth/Throat: Oropharynx is clear and moist.  Eyes: Conjunctivae are normal. Pupils are equal, round, and reactive to light.  Neck: Normal range of motion. No JVD present. No thyromegaly present.  Cardiovascular: Normal rate, regular rhythm, normal heart sounds and intact distal pulses.  Exam reveals no gallop and no friction rub.   No murmur heard. Pulmonary/Chest: Effort normal and breath sounds normal. No respiratory distress. He has no wheezes. He has no rales. He exhibits no tenderness.  Abdominal: Soft. Bowel sounds are normal. He exhibits no distension and no mass. There is no tenderness. There is no rebound and  no guarding.  Musculoskeletal: Normal range of motion. He exhibits no edema or tenderness.  Lymphadenopathy:    He has no cervical adenopathy.  Neurological: He is alert and oriented to person, place, and time. He has normal reflexes. No cranial nerve deficit. He exhibits normal muscle tone. He displays a negative Romberg sign. Coordination and gait normal.  Skin: Skin is warm and dry. No rash noted.  Psychiatric: He has a normal mood and affect. His behavior is normal. Judgment and thought content normal.  LS is tender   Lab Results  Component Value Date   WBC 13.3* 02/22/2014   HGB 16.3 02/22/2014   HCT 47.8 02/22/2014   PLT 259.0 02/22/2014   GLUCOSE 122* 06/05/2014   CHOL 267* 02/22/2014   TRIG * 02/22/2014    610.0 Triglyceride is over 400; calculations on Lipids are invalid.   HDL 26.70* 02/22/2014   LDLDIRECT 144.7 02/22/2014   ALT 34 02/22/2014   AST 28 02/22/2014   NA 139 06/05/2014   K 4.7 06/05/2014   CL 103 06/05/2014   CREATININE 1.00 06/05/2014   BUN 10 06/05/2014   CO2 28 06/05/2014   TSH 1.04 02/22/2014   PSA 0.73 01/29/2009   HGBA1C 7.4* 06/05/2014   MICROALBUR 3.7* 02/22/2014        Assessment & Plan:

## 2014-07-18 DIAGNOSIS — M5126 Other intervertebral disc displacement, lumbar region: Secondary | ICD-10-CM | POA: Diagnosis not present

## 2014-07-18 DIAGNOSIS — G894 Chronic pain syndrome: Secondary | ICD-10-CM | POA: Diagnosis not present

## 2014-07-31 DIAGNOSIS — M7542 Impingement syndrome of left shoulder: Secondary | ICD-10-CM | POA: Diagnosis not present

## 2014-08-14 DIAGNOSIS — M7542 Impingement syndrome of left shoulder: Secondary | ICD-10-CM | POA: Diagnosis not present

## 2014-08-15 ENCOUNTER — Other Ambulatory Visit: Payer: Self-pay | Admitting: Orthopaedic Surgery

## 2014-08-15 DIAGNOSIS — M25512 Pain in left shoulder: Secondary | ICD-10-CM

## 2014-08-23 ENCOUNTER — Ambulatory Visit
Admission: RE | Admit: 2014-08-23 | Discharge: 2014-08-23 | Disposition: A | Discharge status: Home / Self Care | Payer: Medicare Other | Source: Ambulatory Visit | Attending: Orthopaedic Surgery | Admitting: Orthopaedic Surgery

## 2014-08-23 DIAGNOSIS — M7522 Bicipital tendinitis, left shoulder: Secondary | ICD-10-CM | POA: Diagnosis not present

## 2014-08-23 DIAGNOSIS — M7582 Other shoulder lesions, left shoulder: Secondary | ICD-10-CM | POA: Diagnosis not present

## 2014-08-23 DIAGNOSIS — M25512 Pain in left shoulder: Secondary | ICD-10-CM

## 2014-08-23 DIAGNOSIS — M75102 Unspecified rotator cuff tear or rupture of left shoulder, not specified as traumatic: Secondary | ICD-10-CM | POA: Diagnosis not present

## 2014-08-24 ENCOUNTER — Other Ambulatory Visit: Payer: Medicare Other

## 2014-09-02 DIAGNOSIS — M7542 Impingement syndrome of left shoulder: Secondary | ICD-10-CM | POA: Diagnosis not present

## 2014-09-06 ENCOUNTER — Ambulatory Visit: Payer: Medicare Other | Admitting: Internal Medicine

## 2014-09-18 ENCOUNTER — Encounter: Payer: Self-pay | Admitting: Internal Medicine

## 2014-09-18 ENCOUNTER — Other Ambulatory Visit (INDEPENDENT_AMBULATORY_CARE_PROVIDER_SITE_OTHER): Payer: Medicare Other

## 2014-09-18 ENCOUNTER — Other Ambulatory Visit: Payer: Self-pay | Admitting: Internal Medicine

## 2014-09-18 ENCOUNTER — Ambulatory Visit (INDEPENDENT_AMBULATORY_CARE_PROVIDER_SITE_OTHER): Payer: Medicare Other | Admitting: Internal Medicine

## 2014-09-18 VITALS — BP 124/82 | HR 90 | Temp 98.3°F | Ht 68.0 in | Wt 211.0 lb

## 2014-09-18 DIAGNOSIS — E785 Hyperlipidemia, unspecified: Secondary | ICD-10-CM | POA: Diagnosis not present

## 2014-09-18 DIAGNOSIS — Z0189 Encounter for other specified special examinations: Secondary | ICD-10-CM | POA: Diagnosis not present

## 2014-09-18 DIAGNOSIS — I10 Essential (primary) hypertension: Secondary | ICD-10-CM

## 2014-09-18 DIAGNOSIS — IMO0002 Reserved for concepts with insufficient information to code with codable children: Secondary | ICD-10-CM

## 2014-09-18 DIAGNOSIS — E1165 Type 2 diabetes mellitus with hyperglycemia: Secondary | ICD-10-CM

## 2014-09-18 DIAGNOSIS — Z0000 Encounter for general adult medical examination without abnormal findings: Secondary | ICD-10-CM

## 2014-09-18 LAB — HEPATIC FUNCTION PANEL
ALT: 24 U/L (ref 0–53)
AST: 13 U/L (ref 0–37)
Albumin: 4.3 g/dL (ref 3.5–5.2)
Alkaline Phosphatase: 69 U/L (ref 39–117)
BILIRUBIN DIRECT: 0.2 mg/dL (ref 0.0–0.3)
Total Bilirubin: 0.8 mg/dL (ref 0.2–1.2)
Total Protein: 6.9 g/dL (ref 6.0–8.3)

## 2014-09-18 LAB — BASIC METABOLIC PANEL
BUN: 16 mg/dL (ref 6–23)
CALCIUM: 9.4 mg/dL (ref 8.4–10.5)
CO2: 30 meq/L (ref 19–32)
CREATININE: 1.02 mg/dL (ref 0.40–1.50)
Chloride: 101 mEq/L (ref 96–112)
GFR: 80.16 mL/min (ref >60.00)
GLUCOSE: 109 mg/dL — AB (ref 70–99)
Potassium: 4.5 mEq/L (ref 3.5–5.1)
SODIUM: 140 meq/L (ref 135–145)

## 2014-09-18 LAB — LIPID PANEL
CHOLESTEROL: 170 mg/dL (ref 0–200)
HDL: 27.4 mg/dL — AB (ref >39.00)
NONHDL: 142.6
TRIGLYCERIDES: 332 mg/dL — AB (ref 0.0–149.0)
Total CHOL/HDL Ratio: 6
VLDL: 66.4 mg/dL — ABNORMAL HIGH (ref 0.0–40.0)

## 2014-09-18 LAB — HEMOGLOBIN A1C: Hgb A1c MFr Bld: 7.4 % — ABNORMAL HIGH (ref 4.6–6.5)

## 2014-09-18 LAB — LDL CHOLESTEROL, DIRECT: Direct LDL: 95 mg/dL

## 2014-09-18 MED ORDER — METFORMIN HCL ER 500 MG PO TB24: ORAL_TABLET | ORAL | Status: DC

## 2014-09-18 NOTE — Progress Notes (Signed)
Pre visit review using our clinic review tool, if applicable. No additional management support is needed unless otherwise documented below in the visit note. 

## 2014-09-18 NOTE — Assessment & Plan Note (Signed)
stable overall by history and exam, recent data reviewed with pt, and pt to continue medical treatment as before,  to f/u any worsening symptoms or concerns Lab Results  Component Value Date   HGBA1C 7.4* 06/05/2014

## 2014-09-18 NOTE — Progress Notes (Signed)
Subjective:    Patient ID: Ethan Ellis, male    DOB: February 05, 1961, 54 y.o.   MRN: [id number]  HPI  Here to f/u; overall doing ok,  Pt denies chest pain, increasing sob or doe, wheezing, orthopnea, PND, increased LE swelling, palpitations, dizziness or syncope.  Pt denies new neurological symptoms such as new headache, or facial or extremity weakness or numbness.  Pt denies polydipsia, polyuria, or low sugar episode.   Pt denies new neurological symptoms such as new headache, or facial or extremity weakness or numbness.   Pt states overall good compliance with meds, mostly trying to follow appropriate diet, with wt overall stable,  but little exercise however Wt Readings from Last 3 Encounters:  09/18/14 211 lb (95.709 kg)  06/05/14 209 lb (94.802 kg)  03/06/14 216 lb (97.977 kg)   Past Medical History  Diagnosis Date  . LBP (low back pain) 2009    Right  . Hyperlipidemia 03/10/2014   No past surgical history on file.  reports that he has been smoking.  He does not have any smokeless tobacco history on file. He reports that he uses illicit drugs (Marijuana). He reports that he does not drink alcohol. family history includes Diabetes in his father. Allergies  Allergen Reactions  . Naproxen     REACTION: mouth swelling. He can take advil and some other NSAIDs w/o problems   Current Outpatient Prescriptions on File Prior to Visit  Medication Sig Dispense Refill  . aspirin EC 81 MG tablet Take 1 tablet (81 mg total) by mouth daily. 90 tablet 11  . atorvastatin (LIPITOR) 10 MG tablet Take 1 tablet (10 mg total) by mouth daily. 90 tablet 3  . Blood Glucose Monitoring Suppl (ONE TOUCH ULTRA SYSTEM KIT) W/DEVICE KIT Use as directed to check blood sugar.  Diagnosis code E11.9 1 each 0  . Cholecalciferol 1000 UNITS tablet Take 1,000 Units by mouth daily.      Marland Kitchen gabapentin (NEURONTIN) 300 MG capsule Take 1 capsule (300 mg total) by mouth 3 (three) times daily. 90 capsule 3  . glucose blood (ONE  TOUCH TEST STRIPS) test strip Use as instructed once daily to check blood sugar.  Diagnosis code E11.9 100 each 12  . Lancets MISC Use as directed once daily to check blood sugar.  Diagnosis code E11.9 100 each 11  . metFORMIN (GLUCOPHAGE XR) 500 MG 24 hr tablet 2 po qd 180 tablet 3  . valsartan-hydrochlorothiazide (DIOVAN HCT) 320-25 MG per tablet Take 1 tablet by mouth daily. 90 tablet 3   No current facility-administered medications on file prior to visit.    Review of Systems  Constitutional: Negative for unusual diaphoresis or night sweats HENT: Negative for ringing in ear or discharge Eyes: Negative for double vision or worsening visual disturbance.  Respiratory: Negative for choking and stridor.   Gastrointestinal: Negative for vomiting or other signifcant bowel change Genitourinary: Negative for hematuria or change in urine volume.  Musculoskeletal: Negative for other MSK pain or swelling Skin: Negative for color change and worsening wound.  Neurological: Negative for tremors and numbness other than noted  Psychiatric/Behavioral: Negative for decreased concentration or agitation other than above       Objective:   Physical Exam BP 124/82 mmHg  Pulse 90  Temp(Src) 98.3 F (36.8 C) (Oral)  Ht 5' 8"  (1.727 m)  Wt 211 lb (95.709 kg)  BMI 32.09 kg/m2  SpO2 96% VS noted,  Constitutional: Pt appears in no significant distress HENT: Head:  NCAT.  Right Ear: External ear normal.  Left Ear: External ear normal.  Eyes: . Pupils are equal, round, and reactive to light. Conjunctivae and EOM are normal Neck: Normal range of motion. Neck supple.  Cardiovascular: Normal rate and regular rhythm.   Pulmonary/Chest: Effort normal and breath sounds without rales or wheezing.  Abd:  Soft, NT, ND, + BS Neurological: Pt is alert. Not confused , motor grossly intact Skin: Skin is warm. No rash, no LE edema Psychiatric: Pt behavior is normal. No agitation.     Assessment & Plan:

## 2014-09-18 NOTE — Patient Instructions (Addendum)
/  Please continue all other medications as before, and refills have been done if requested.  Please have the pharmacy call with any other refills you may need.  Please continue your efforts at being more active, low cholesterol diet, and weight control.  You are otherwise up to date with prevention measures today.  Please keep your appointments with your specialists as you may have planned  Please go to the LAB in the Basement (turn left off the elevator) for the tests to be done today  You will be contacted by phone if any changes need to be made immediately.  Otherwise, you will receive a letter about your results with an explanation, but please check with MyChart first.  Please remember to sign up for MyChart if you have not done so, as this will be important to you in the future with finding out test results, communicating by private email, and scheduling acute appointments online when needed.  You will be contacted regarding the referral for: colonoscopy  Please return in 6 months, or sooner if needed

## 2014-09-18 NOTE — Assessment & Plan Note (Signed)
stable overall by history and exam, recent data reviewed with pt, and pt to continue medical treatment as before,  to f/u any worsening symptoms or concerns BP Readings from Last 3 Encounters:  09/18/14 124/82  06/05/14 190/100  03/06/14 148/98

## 2014-09-18 NOTE — Assessment & Plan Note (Signed)
stable overall by history and exam, recent data reviewed with pt, and pt to continue medical treatment as before,  to f/u any worsening symptoms or concerns Lab Results  Component Value Date   CHOL 267* 02/22/2014   HDL 26.70* 02/22/2014   LDLDIRECT 144.7 02/22/2014   TRIG * 02/22/2014    610.0 Triglyceride is over 400; calculations on Lipids are invalid.   CHOLHDL 10 02/22/2014

## 2014-09-30 ENCOUNTER — Encounter: Payer: Self-pay | Admitting: Gastroenterology

## 2014-10-22 DIAGNOSIS — G894 Chronic pain syndrome: Secondary | ICD-10-CM | POA: Diagnosis not present

## 2014-10-22 DIAGNOSIS — M5126 Other intervertebral disc displacement, lumbar region: Secondary | ICD-10-CM | POA: Diagnosis not present

## 2014-10-31 DIAGNOSIS — Z125 Encounter for screening for malignant neoplasm of prostate: Secondary | ICD-10-CM | POA: Diagnosis not present

## 2014-11-07 DIAGNOSIS — Z87442 Personal history of urinary calculi: Secondary | ICD-10-CM | POA: Diagnosis not present

## 2014-11-07 DIAGNOSIS — N400 Enlarged prostate without lower urinary tract symptoms: Secondary | ICD-10-CM | POA: Diagnosis not present

## 2014-11-07 DIAGNOSIS — N5201 Erectile dysfunction due to arterial insufficiency: Secondary | ICD-10-CM | POA: Diagnosis not present

## 2014-11-22 ENCOUNTER — Ambulatory Visit (AMBULATORY_SURGERY_CENTER): Payer: Self-pay | Admitting: *Deleted

## 2014-11-22 VITALS — Ht 69.0 in | Wt 213.0 lb

## 2014-11-22 DIAGNOSIS — Z1211 Encounter for screening for malignant neoplasm of colon: Secondary | ICD-10-CM

## 2014-11-22 NOTE — Progress Notes (Signed)
Patient denies any allergies to egg or soy products. Patient denies complications with anesthesia/sedation.  Patient denies oxygen use at home and denies diet medications. Emmi instructions for colonoscopy but patient refused.   

## 2014-12-06 ENCOUNTER — Ambulatory Visit (AMBULATORY_SURGERY_CENTER): Payer: Medicare Other | Admitting: Gastroenterology

## 2014-12-06 ENCOUNTER — Encounter: Payer: Self-pay | Admitting: Gastroenterology

## 2014-12-06 VITALS — BP 121/73 | HR 74 | Temp 97.8°F | Resp 23 | Ht 69.0 in | Wt 213.0 lb

## 2014-12-06 DIAGNOSIS — F329 Major depressive disorder, single episode, unspecified: Secondary | ICD-10-CM | POA: Diagnosis not present

## 2014-12-06 DIAGNOSIS — D125 Benign neoplasm of sigmoid colon: Secondary | ICD-10-CM

## 2014-12-06 DIAGNOSIS — M545 Low back pain: Secondary | ICD-10-CM | POA: Diagnosis not present

## 2014-12-06 DIAGNOSIS — E119 Type 2 diabetes mellitus without complications: Secondary | ICD-10-CM | POA: Diagnosis not present

## 2014-12-06 DIAGNOSIS — I10 Essential (primary) hypertension: Secondary | ICD-10-CM | POA: Diagnosis not present

## 2014-12-06 DIAGNOSIS — Z1211 Encounter for screening for malignant neoplasm of colon: Secondary | ICD-10-CM

## 2014-12-06 HISTORY — PX: COLONOSCOPY: SHX174

## 2014-12-06 LAB — GLUCOSE, CAPILLARY
Glucose-Capillary: 144 mg/dL — ABNORMAL HIGH (ref 65–99)
Glucose-Capillary: 139 mg/dL — ABNORMAL HIGH (ref 65–99)

## 2014-12-06 MED ORDER — SODIUM CHLORIDE 0.9 % IV SOLN: 500.0000 mL | INTRAVENOUS | Status: DC

## 2014-12-06 NOTE — Progress Notes (Signed)
Called to room to assist during endoscopic procedure.  Patient ID and intended procedure confirmed with present staff. Received instructions for my participation in the procedure from the performing physician.  

## 2014-12-06 NOTE — Patient Instructions (Signed)
  AVOID ASPIRIN, ASPIRIN PRODUCTS OR NSAIDS (MOTRIN, ADVIL,ALEVE ETC) FOR TWO WEEKS, December 20, 2014.    YOU HAD AN ENDOSCOPIC PROCEDURE TODAY AT [facility]:   Refer to the procedure report that was given to you for any specific questions about what was found during the examination.  If the procedure report does not answer your questions, please call your gastroenterologist to clarify.  If you requested that your care partner not be given the details of your procedure findings, then the procedure report has been included in a sealed envelope for you to review at your convenience later.  YOU SHOULD EXPECT: Some feelings of bloating in the abdomen. Passage of more gas than usual.  Walking can help get rid of the air that was put into your GI tract during the procedure and reduce the bloating. If you had a lower endoscopy (such as a colonoscopy or flexible sigmoidoscopy) you may notice spotting of blood in your stool or on the toilet paper. If you underwent a bowel prep for your procedure, you may not have a normal bowel movement for a few days.  Please Note:  You might notice some irritation and congestion in your nose or some drainage.  This is from the oxygen used during your procedure.  There is no need for concern and it should clear up in a day or so.  SYMPTOMS TO REPORT IMMEDIATELY:   Following lower endoscopy (colonoscopy or flexible sigmoidoscopy):  Excessive amounts of blood in the stool  Significant tenderness or worsening of abdominal pains  Swelling of the abdomen that is new, acute  Fever of 100F or higher  For urgent or emergent issues, a gastroenterologist can be reached at any hour by calling [phone].   DIET: Your first meal following the procedure should be a small meal and then it is ok to progress to your normal diet. Heavy or fried foods are harder to digest and may make you feel nauseous or bloated.  Likewise, meals heavy in dairy and vegetables can  increase bloating.  Drink plenty of fluids but you should avoid alcoholic beverages for 24 hours.  ACTIVITY:  You should plan to take it easy for the rest of today and you should NOT DRIVE or use heavy machinery until tomorrow (because of the sedation medicines used during the test).    FOLLOW UP: Our staff will call the number listed on your records the next business day following your procedure to check on you and address any questions or concerns that you may have regarding the information given to you following your procedure. If we do not reach you, we will leave a message.  However, if you are feeling well and you are not experiencing any problems, there is no need to return our call.  We will assume that you have returned to your regular daily activities without incident.  If any biopsies were taken you will be contacted by phone or by letter within the next 1-3 weeks.  Please call us at [phone] if you have not heard about the biopsies in 3 weeks.    SIGNATURES/CONFIDENTIALITY: You and/or your care partner have signed paperwork which will be entered into your electronic medical record.  These signatures attest to the fact that that the information above on your After Visit Summary has been reviewed and is understood.  Full responsibility of the confidentiality of this discharge information lies with you and/or your care-partner.

## 2014-12-06 NOTE — Op Note (Signed)
[facility]  [address] [place], [address]   COLONOSCOPY PROCEDURE REPORT PATIENT: Ethan Ellis, Ethan Ellis  MR#: [id number] BIRTHDATE: 12-16-1960 , 59  yrs. old GENDER: male ENDOSCOPIST: Ladene Artist, MD, [facility] REFERRED NL:[id number] John, M.D. PROCEDURE DATE:  12/06/2014 PROCEDURE:   Colonoscopy, screening and Colonoscopy with snare polypectomy First Screening Colonoscopy - Avg.  risk and is 50 yrs.  old or older Yes.  Prior Negative Screening - Now for repeat screening. N/A  History of Adenoma - Now for follow-up colonoscopy & has been > or = to 3 yrs.  N/A  Polyps removed today? Yes ASA CLASS:   Class II INDICATIONS:Screening for colonic neoplasia and Colorectal Neoplasm Risk Assessment for this procedure is average risk. MEDICATIONS: Monitored anesthesia care and Propofol 260 mg IV DESCRIPTION OF PROCEDURE:   After the risks benefits and alternatives of the procedure were thoroughly explained, informed consent was obtained.  The digital rectal exam revealed no abnormalities of the rectum.   The LB [id number]  endoscope was introduced through the anus and advanced to the cecum, which was identified by both the appendix and ileocecal valve. No adverse events experienced.   The quality of the prep was excellent. (MiraLax was used)  The instrument was then slowly withdrawn as the colon was fully examined. Estimated blood loss is zero unless otherwise noted in this procedure report.  COLON FINDINGS: A pedunculated polyp measuring 12 mm in size was found in the sigmoid colon.  A polypectomy was performed using snare cautery.  The resection was complete, the polyp tissue was completely retrieved and sent to histology.   A sessile polyp measuring 6 mm in size was found in the sigmoid colon.  A polypectomy was performed with a cold snare.  The resection was complete, the polyp tissue was completely retrieved and sent to histology.   Medium sized lipoma was found in the  transverse colon. There was moderate diverticulosis noted in the sigmoid colon and descending colon with associated muscular hypertrophy.   The examination was otherwise normal.  Retroflexed views revealed internal Grade I hemorrhoids. The time to cecum = 2.2 Withdrawal time = 12.3   The scope was withdrawn and the procedure completed. COMPLICATIONS: There were no immediate complications. ENDOSCOPIC IMPRESSION: 1.   Pedunculated polyp in the sigmoid colon; polypectomy performed using snare cautery 2.   Sessile polyp in the sigmoid colon; polypectomy performed with a cold snare 3.   Medium sized lipoma in the transverse colon 4.   Moderate diverticulosis noted in the sigmoid colon and descending colon 5.   Grade l internal hemorrhoids RECOMMENDATIONS: 1.  Hold Aspirin and all other NSAIDS for 2 weeks. 2.  Await pathology results 3.  High fiber diet with liberal fluid intake. 4.  Repeat colonoscopy in 3 years if the larger polyp is adenomatous; 5 years if only the smaller polyps is adenomatous: otherwise 10 years  eSigned:  Ladene Artist, MD, [facility] 12/06/2014 11:51 AM

## 2014-12-06 NOTE — Progress Notes (Signed)
Report to PACU, RN, vss, BBS= Clear.  

## 2014-12-09 ENCOUNTER — Telehealth: Payer: Self-pay | Admitting: *Deleted

## 2014-12-09 NOTE — Telephone Encounter (Signed)
  Follow up Call-  Call back number 12/06/2014  Post procedure Call Back phone  # [phone]  Permission to leave phone message Yes     Patient questions:  Do you have a fever, pain , or abdominal swelling? No. Pain Score  0 *  Have you tolerated food without any problems? Yes.    Have you been able to return to your normal activities? Yes.    Do you have any questions about your discharge instructions: Diet   No. Medications  No. Follow up visit  No.  Do you have questions or concerns about your Care? No.  Actions: * If pain score is 4 or above: No action needed, pain <4.

## 2014-12-11 ENCOUNTER — Encounter: Payer: Self-pay | Admitting: Gastroenterology

## 2014-12-12 DIAGNOSIS — M7542 Impingement syndrome of left shoulder: Secondary | ICD-10-CM | POA: Diagnosis not present

## 2014-12-12 DIAGNOSIS — M659 Synovitis and tenosynovitis, unspecified: Secondary | ICD-10-CM | POA: Diagnosis not present

## 2014-12-12 DIAGNOSIS — M7552 Bursitis of left shoulder: Secondary | ICD-10-CM | POA: Diagnosis not present

## 2014-12-12 DIAGNOSIS — M24112 Other articular cartilage disorders, left shoulder: Secondary | ICD-10-CM | POA: Diagnosis not present

## 2014-12-12 DIAGNOSIS — G8918 Other acute postprocedural pain: Secondary | ICD-10-CM | POA: Diagnosis not present

## 2015-02-24 ENCOUNTER — Other Ambulatory Visit: Payer: Self-pay | Admitting: Internal Medicine

## 2015-03-25 ENCOUNTER — Ambulatory Visit: Payer: Medicare Other | Admitting: Internal Medicine

## 2015-04-01 ENCOUNTER — Ambulatory Visit (INDEPENDENT_AMBULATORY_CARE_PROVIDER_SITE_OTHER): Payer: Medicare Other | Admitting: Internal Medicine

## 2015-04-01 ENCOUNTER — Encounter: Payer: Self-pay | Admitting: Internal Medicine

## 2015-04-01 ENCOUNTER — Other Ambulatory Visit (INDEPENDENT_AMBULATORY_CARE_PROVIDER_SITE_OTHER): Payer: Medicare Other

## 2015-04-01 VITALS — BP 136/80 | HR 97 | Temp 98.4°F | Resp 20 | Ht 69.0 in | Wt 222.0 lb

## 2015-04-01 DIAGNOSIS — E1165 Type 2 diabetes mellitus with hyperglycemia: Secondary | ICD-10-CM

## 2015-04-01 DIAGNOSIS — IMO0001 Reserved for inherently not codable concepts without codable children: Secondary | ICD-10-CM

## 2015-04-01 DIAGNOSIS — I10 Essential (primary) hypertension: Secondary | ICD-10-CM

## 2015-04-01 DIAGNOSIS — Z23 Encounter for immunization: Secondary | ICD-10-CM

## 2015-04-01 DIAGNOSIS — E785 Hyperlipidemia, unspecified: Secondary | ICD-10-CM | POA: Diagnosis not present

## 2015-04-01 DIAGNOSIS — Z1159 Encounter for screening for other viral diseases: Secondary | ICD-10-CM

## 2015-04-01 LAB — HEPATIC FUNCTION PANEL
ALK PHOS: 69 U/L (ref 39–117)
ALT: 39 U/L (ref 0–53)
AST: 24 U/L (ref 0–37)
Albumin: 4.3 g/dL (ref 3.5–5.2)
BILIRUBIN DIRECT: 0.1 mg/dL (ref 0.0–0.3)
Total Bilirubin: 0.6 mg/dL (ref 0.2–1.2)
Total Protein: 6.8 g/dL (ref 6.0–8.3)

## 2015-04-01 LAB — URINALYSIS, ROUTINE W REFLEX MICROSCOPIC
BILIRUBIN URINE: NEGATIVE
HGB URINE DIPSTICK: NEGATIVE
KETONES UR: NEGATIVE
LEUKOCYTES UA: NEGATIVE
NITRITE: NEGATIVE
RBC / HPF: NONE SEEN (ref 0–?)
Specific Gravity, Urine: 1.025 (ref 1.000–1.030)
Total Protein, Urine: NEGATIVE
UROBILINOGEN UA: 0.2 (ref 0.0–1.0)
Urine Glucose: 100 — AB
WBC UA: NONE SEEN (ref 0–?)
pH: 6 (ref 5.0–8.0)

## 2015-04-01 LAB — BASIC METABOLIC PANEL
BUN: 14 mg/dL (ref 6–23)
CALCIUM: 9.5 mg/dL (ref 8.4–10.5)
CO2: 32 meq/L (ref 19–32)
Chloride: 101 mEq/L (ref 96–112)
Creatinine, Ser: 1.04 mg/dL (ref 0.40–1.50)
GFR: 78.24 mL/min (ref >60.00)
Glucose, Bld: 219 mg/dL — ABNORMAL HIGH (ref 70–99)
Potassium: 4 mEq/L (ref 3.5–5.1)
SODIUM: 141 meq/L (ref 135–145)

## 2015-04-01 LAB — MICROALBUMIN / CREATININE URINE RATIO
Creatinine,U: 120.4 mg/dL
Microalb Creat Ratio: 0.9 mg/g (ref 0.0–30.0)
Microalb, Ur: 1.1 mg/dL (ref 0.0–1.9)

## 2015-04-01 LAB — CBC WITH DIFFERENTIAL/PLATELET
Basophils Absolute: 0.1 10*3/uL (ref 0.0–0.1)
Basophils Relative: 0.5 % (ref 0.0–3.0)
EOS ABS: 0.4 10*3/uL (ref 0.0–0.7)
EOS PCT: 3.7 % (ref 0.0–5.0)
HEMATOCRIT: 46.1 % (ref 39.0–52.0)
HEMOGLOBIN: 15.8 g/dL (ref 13.0–17.0)
Lymphocytes Relative: 36.2 % (ref 12.0–46.0)
Lymphs Abs: 3.8 10*3/uL (ref 0.7–4.0)
MCHC: 34.3 g/dL (ref 30.0–36.0)
MCV: 92.3 fl (ref 78.0–100.0)
MONOS PCT: 5.1 % (ref 3.0–12.0)
Monocytes Absolute: 0.5 10*3/uL (ref 0.1–1.0)
NEUTROS ABS: 5.8 10*3/uL (ref 1.4–7.7)
Neutrophils Relative %: 54.5 % (ref 43.0–77.0)
Platelets: 241 10*3/uL (ref 150.0–400.0)
RBC: 5 Mil/uL (ref 4.22–5.81)
RDW: 12.1 % (ref 11.5–15.5)
WBC: 10.6 10*3/uL — ABNORMAL HIGH (ref 4.0–10.5)

## 2015-04-01 LAB — LDL CHOLESTEROL, DIRECT: LDL DIRECT: 88 mg/dL

## 2015-04-01 LAB — LIPID PANEL
CHOL/HDL RATIO: 6
CHOLESTEROL: 170 mg/dL (ref 0–200)
HDL: 28.6 mg/dL — ABNORMAL LOW (ref >39.00)

## 2015-04-01 LAB — TSH: TSH: 1.07 u[IU]/mL (ref 0.35–4.50)

## 2015-04-01 LAB — HEMOGLOBIN A1C: Hgb A1c MFr Bld: 8.6 % — ABNORMAL HIGH (ref 4.6–6.5)

## 2015-04-01 NOTE — Progress Notes (Signed)
Pre visit review using our clinic review tool, if applicable. No additional management support is needed unless otherwise documented below in the visit note. 

## 2015-04-01 NOTE — Assessment & Plan Note (Signed)
stable overall by history and exam, recent data reviewed with pt, and pt to continue medical treatment as before,  to f/u any worsening symptoms or concerns ble lastl ldl - 98

## 2015-04-01 NOTE — Assessment & Plan Note (Signed)
stable overall by history and exam, recent data reviewed with pt, and pt to continue medical treatment as before,  to f/u any worsening symptoms or concerns Lab Results  Component Value Date   HGBA1C 7.4* 09/18/2014   For goal < 7, for f/u lab today

## 2015-04-01 NOTE — Patient Instructions (Addendum)

## 2015-04-01 NOTE — Assessment & Plan Note (Signed)
stable overall by history and exam, recent data reviewed with pt, and pt to continue medical treatment as before,  to f/u any worsening symptoms or concerns BP Readings from Last 3 Encounters:  04/01/15 136/80  12/06/14 121/73  09/18/14 124/82

## 2015-04-01 NOTE — Progress Notes (Signed)
Subjective:    Patient ID: Ethan Ellis, male    DOB: July 07, 1960, 55 y.o.   MRN: [id number]  HPI  Here for wellness and f/u;  Overall doing ok;  Pt denies Chest pain, worsening SOB, DOE, wheezing, orthopnea, PND, worsening LE edema, palpitations, dizziness or syncope.  Pt denies neurological change such as new headache, facial or extremity weakness.  Pt denies polydipsia, polyuria, or low sugar symptoms. Pt states overall good compliance with treatment and medications, good tolerability, and has been trying to follow appropriate diet.  Pt denies worsening depressive symptoms, suicidal ideation or panic. No fever, night sweats, wt loss, loss of appetite, or other constitutional symptoms.  Pt states good ability with ADL's, has low fall risk, home safety reviewed and adequate, no other significant changes in hearing or vision, and only occasionally active with exercise. Plans to start walking more.  S/p left shoulder surgury sept 2016 dr blackmon with much improved pain and ROM.Marland Kitchen Past Medical History  Diagnosis Date  . LBP (low back pain) 2009    Right  . Hyperlipidemia 03/10/2014  . Diabetes mellitus without complication ([place])     type 2  . Hypertension   . Depression   . History of kidney stones     passed stone - no surgery   Past Surgical History  Procedure Laterality Date  . Appendectomy    . Shoulder surgery      right - x 3 surgery  . Back surgery      lower back     reports that he has been smoking Cigarettes.  He has a 20 pack-year smoking history. He has never used smokeless tobacco. He reports that he uses illicit drugs (Marijuana). He reports that he does not drink alcohol. family history includes Diabetes in his father. There is no history of Colon cancer, Colon polyps, Rectal cancer, or Stomach cancer. Allergies  Allergen Reactions  . Naproxen     REACTION: mouth swelling. He can take advil and some other NSAIDs w/o problems   Current Outpatient Prescriptions on File  Prior to Visit  Medication Sig Dispense Refill  . aspirin EC 81 MG tablet Take 1 tablet (81 mg total) by mouth daily. 90 tablet 11  . atorvastatin (LIPITOR) 10 MG tablet TAKE ONE TABLET BY MOUTH ONCE DAILY 90 tablet 1  . Blood Glucose Monitoring Suppl (ONE TOUCH ULTRA SYSTEM KIT) W/DEVICE KIT Use as directed to check blood sugar.  Diagnosis code E11.9 1 each 0  . glucose blood (ONE TOUCH TEST STRIPS) test strip Use as instructed once daily to check blood sugar.  Diagnosis code E11.9 100 each 12  . Lancets MISC Use as directed once daily to check blood sugar.  Diagnosis code E11.9 100 each 11  . metFORMIN (GLUCOPHAGE XR) 500 MG 24 hr tablet 3 tabs by mouth in the AM 270 tablet 3  . nortriptyline (PAMELOR) 50 MG capsule Take 50 mg by mouth 3 (three) times daily.    Marland Kitchen tiZANidine (ZANAFLEX) 4 MG tablet Take 4 mg by mouth 3 (three) times daily.    . valsartan-hydrochlorothiazide (DIOVAN HCT) 320-25 MG per tablet Take 1 tablet by mouth daily. 90 tablet 3   No current facility-administered medications on file prior to visit.   Review of Systems Constitutional: Negative for increased diaphoresis, other activity, appetite or siginficant weight change other than noted HENT: Negative for worsening hearing loss, ear pain, facial swelling, mouth sores and neck stiffness.   Eyes: Negative for other worsening  pain, redness or visual disturbance.  Respiratory: Negative for shortness of breath and wheezing  Cardiovascular: Negative for chest pain and palpitations.  Gastrointestinal: Negative for diarrhea, blood in stool, abdominal distention or other pain Genitourinary: Negative for hematuria, flank pain or change in urine volume.  Musculoskeletal: Negative for myalgias or other joint complaints.  Skin: Negative for color change and wound or drainage.  Neurological: Negative for syncope and numbness. other than noted Hematological: Negative for adenopathy. or other swelling Psychiatric/Behavioral: Negative  for hallucinations, SI, self-injury, decreased concentration or other worsening agitation.      Objective:   Physical Exam BP 136/80 mmHg  Pulse 97  Temp(Src) 98.4 F (36.9 C) (Oral)  Resp 20  Ht 5' 9"  (1.753 m)  Wt 222 lb (100.699 kg)  BMI 32.77 kg/m2  SpO2 93% VS noted,  Constitutional: Pt is oriented to person, place, and time. Appears well-developed and well-nourished, in no significant distress Head: Normocephalic and atraumatic.  Right Ear: External ear normal.  Left Ear: External ear normal.  Nose: Nose normal.  Mouth/Throat: Oropharynx is clear and moist.  Eyes: Conjunctivae and EOM are normal. Pupils are equal, round, and reactive to light.  Neck: Normal range of motion. Neck supple. No JVD present. No tracheal deviation present or significant neck LA or mass Cardiovascular: Normal rate, regular rhythm, normal heart sounds and intact distal pulses.   Pulmonary/Chest: Effort normal and breath sounds without rales or wheezing  Abdominal: Soft. Bowel sounds are normal. NT. No HSM  Musculoskeletal: Normal range of motion. Exhibits no edema.  Lymphadenopathy:  Has no cervical adenopathy.  Neurological: Pt is alert and oriented to person, place, and time. Pt has normal reflexes. No cranial nerve deficit. Motor grossly intact Skin: Skin is warm and dry. No rash noted.  Psychiatric:  Has normal mood and affect. Behavior is normal.     Assessment & Plan:

## 2015-04-02 LAB — HEPATITIS C ANTIBODY: HCV AB: REACTIVE — AB

## 2015-04-04 ENCOUNTER — Encounter: Payer: Self-pay | Admitting: Internal Medicine

## 2015-04-04 DIAGNOSIS — B1920 Unspecified viral hepatitis C without hepatic coma: Secondary | ICD-10-CM

## 2015-04-04 HISTORY — DX: Unspecified viral hepatitis C without hepatic coma: B19.20

## 2015-04-07 LAB — HEPATITIS C RNA QUANTITATIVE

## 2015-04-07 NOTE — Progress Notes (Signed)
Calling [facility]

## 2015-04-08 ENCOUNTER — Other Ambulatory Visit: Payer: Self-pay | Admitting: Internal Medicine

## 2015-04-08 ENCOUNTER — Encounter: Payer: Self-pay | Admitting: Internal Medicine

## 2015-04-08 DIAGNOSIS — R768 Other specified abnormal immunological findings in serum: Secondary | ICD-10-CM

## 2015-04-08 DIAGNOSIS — B182 Chronic viral hepatitis C: Secondary | ICD-10-CM

## 2015-04-11 ENCOUNTER — Other Ambulatory Visit: Payer: Medicare Other

## 2015-04-11 DIAGNOSIS — R894 Abnormal immunological findings in specimens from other organs, systems and tissues: Secondary | ICD-10-CM | POA: Diagnosis not present

## 2015-04-11 DIAGNOSIS — R768 Other specified abnormal immunological findings in serum: Secondary | ICD-10-CM

## 2015-04-11 DIAGNOSIS — B182 Chronic viral hepatitis C: Secondary | ICD-10-CM

## 2015-04-14 LAB — HEPATITIS C RNA QUANTITATIVE: HCV Quantitative: NOT DETECTED IU/mL (ref <15)

## 2015-04-15 ENCOUNTER — Encounter: Payer: Self-pay | Admitting: Internal Medicine

## 2015-04-28 DIAGNOSIS — M545 Low back pain: Secondary | ICD-10-CM | POA: Diagnosis not present

## 2015-04-28 DIAGNOSIS — G894 Chronic pain syndrome: Secondary | ICD-10-CM | POA: Diagnosis not present

## 2015-04-28 DIAGNOSIS — M5126 Other intervertebral disc displacement, lumbar region: Secondary | ICD-10-CM | POA: Diagnosis not present

## 2015-06-14 ENCOUNTER — Other Ambulatory Visit: Payer: Self-pay | Admitting: Internal Medicine

## 2015-08-27 ENCOUNTER — Other Ambulatory Visit: Payer: Self-pay | Admitting: Internal Medicine

## 2015-09-17 ENCOUNTER — Other Ambulatory Visit: Payer: Self-pay | Admitting: *Deleted

## 2015-09-17 MED ORDER — VALSARTAN-HYDROCHLOROTHIAZIDE 320-25 MG PO TABS: 1.0000 | ORAL_TABLET | Freq: Every day | ORAL | Status: DC

## 2015-09-17 NOTE — Telephone Encounter (Signed)
Left msg on triage out of BP medication needing refill today. Called pt back no answer LMOM refill has been sent to [facility]...Johny Chess

## 2015-09-30 ENCOUNTER — Encounter: Payer: Self-pay | Admitting: Internal Medicine

## 2015-09-30 ENCOUNTER — Other Ambulatory Visit: Payer: Self-pay | Admitting: Internal Medicine

## 2015-09-30 ENCOUNTER — Ambulatory Visit (INDEPENDENT_AMBULATORY_CARE_PROVIDER_SITE_OTHER): Payer: Medicare Other | Admitting: Internal Medicine

## 2015-09-30 ENCOUNTER — Other Ambulatory Visit (INDEPENDENT_AMBULATORY_CARE_PROVIDER_SITE_OTHER): Payer: Medicare Other

## 2015-09-30 VITALS — BP 124/78 | HR 106 | Temp 98.6°F | Resp 20 | Wt 211.0 lb

## 2015-09-30 DIAGNOSIS — IMO0001 Reserved for inherently not codable concepts without codable children: Secondary | ICD-10-CM

## 2015-09-30 DIAGNOSIS — G8929 Other chronic pain: Secondary | ICD-10-CM

## 2015-09-30 DIAGNOSIS — I10 Essential (primary) hypertension: Secondary | ICD-10-CM | POA: Diagnosis not present

## 2015-09-30 DIAGNOSIS — M545 Low back pain: Secondary | ICD-10-CM

## 2015-09-30 DIAGNOSIS — B1920 Unspecified viral hepatitis C without hepatic coma: Secondary | ICD-10-CM | POA: Diagnosis not present

## 2015-09-30 DIAGNOSIS — E785 Hyperlipidemia, unspecified: Secondary | ICD-10-CM | POA: Diagnosis not present

## 2015-09-30 DIAGNOSIS — E1165 Type 2 diabetes mellitus with hyperglycemia: Secondary | ICD-10-CM

## 2015-09-30 LAB — BASIC METABOLIC PANEL
BUN: 14 mg/dL (ref 6–23)
CHLORIDE: 99 meq/L (ref 96–112)
CO2: 32 meq/L (ref 19–32)
CREATININE: 0.99 mg/dL (ref 0.40–1.50)
Calcium: 9.6 mg/dL (ref 8.4–10.5)
GFR: 82.67 mL/min (ref >60.00)
GLUCOSE: 158 mg/dL — AB (ref 70–99)
POTASSIUM: 3.4 meq/L — AB (ref 3.5–5.1)
Sodium: 138 mEq/L (ref 135–145)

## 2015-09-30 LAB — LIPID PANEL
CHOLESTEROL: 169 mg/dL (ref 0–200)
HDL: 30.4 mg/dL — ABNORMAL LOW (ref >39.00)
NonHDL: 138.29
TRIGLYCERIDES: 276 mg/dL — AB (ref 0.0–149.0)
Total CHOL/HDL Ratio: 6
VLDL: 55.2 mg/dL — ABNORMAL HIGH (ref 0.0–40.0)

## 2015-09-30 LAB — HEPATIC FUNCTION PANEL
ALBUMIN: 4.5 g/dL (ref 3.5–5.2)
ALT: 35 U/L (ref 0–53)
AST: 23 U/L (ref 0–37)
Alkaline Phosphatase: 71 U/L (ref 39–117)
Bilirubin, Direct: 0.2 mg/dL (ref 0.0–0.3)
TOTAL PROTEIN: 6.9 g/dL (ref 6.0–8.3)
Total Bilirubin: 0.8 mg/dL (ref 0.2–1.2)

## 2015-09-30 LAB — HEMOGLOBIN A1C: HEMOGLOBIN A1C: 8.8 % — AB (ref 4.6–6.5)

## 2015-09-30 LAB — LDL CHOLESTEROL, DIRECT: Direct LDL: 114 mg/dL

## 2015-09-30 MED ORDER — CYCLOBENZAPRINE HCL 5 MG PO TABS: 5.0000 mg | ORAL_TABLET | Freq: Three times a day (TID) | ORAL | Status: DC | PRN

## 2015-09-30 MED ORDER — GLIPIZIDE ER 5 MG PO TB24: 5.0000 mg | ORAL_TABLET | Freq: Every day | ORAL | Status: DC

## 2015-09-30 NOTE — Assessment & Plan Note (Signed)
stable overall by history and exam, recent data reviewed with pt, and pt to continue medical treatment as before,  to f/u any worsening symptoms or concerns Lab Results  Component Value Date   HGBA1C 8.6* 04/01/2015

## 2015-09-30 NOTE — Progress Notes (Signed)
Pre visit review using our clinic review tool, if applicable. No additional management support is needed unless otherwise documented below in the visit note. 

## 2015-09-30 NOTE — Assessment & Plan Note (Signed)
stable overall by history and exam, recent data reviewed with pt, and pt to continue medical treatment as before,  to f/u any worsening symptoms or concerns BP Readings from Last 3 Encounters:  09/30/15 124/78  04/01/15 136/80  12/06/14 121/73

## 2015-09-30 NOTE — Progress Notes (Signed)
Subjective:    Patient ID: Ethan Ellis, male    DOB: 1960/09/22, 55 y.o.   MRN: [id number]  HPI  Here to f/u; overall doing ok,  Pt denies chest pain, increasing sob or doe, wheezing, orthopnea, PND, increased LE swelling, palpitations, dizziness or syncope.  Pt denies new neurological symptoms such as new headache, or facial or extremity weakness or numbness.  Pt denies polydipsia, polyuria, or low sugar episode.   Pt denies new neurological symptoms such as new headache, or facial or extremity weakness or numbness.   Pt states overall good compliance with meds, mostly trying to follow appropriate diet, with wt overall stable,  but little exercise however.  Pt continues to have recurring LBP with some worsening after a pull last wk, still locally tender, but no bowel or bladder change, fever, wt loss,  worsening LE pain/numbness/weakness, gait change or falls. Past Medical History  Diagnosis Date  . LBP (low back pain) 2009    Right  . Hyperlipidemia 03/10/2014  . Diabetes mellitus without complication ([place])     type 2  . Hypertension   . Depression   . History of kidney stones     passed stone - no surgery  . Unspecified viral hepatitis C without hepatic coma 04/04/2015   Past Surgical History  Procedure Laterality Date  . Appendectomy    . Shoulder surgery      right - x 3 surgery  . Back surgery      lower back     reports that he has been smoking Cigarettes.  He has a 20 pack-year smoking history. He has never used smokeless tobacco. He reports that he uses illicit drugs (Marijuana). He reports that he does not drink alcohol. family history includes Diabetes in his father. There is no history of Colon cancer, Colon polyps, Rectal cancer, or Stomach cancer. Allergies  Allergen Reactions  . Naproxen     REACTION: mouth swelling. He can take advil and some other NSAIDs w/o problems   Current Outpatient Prescriptions on File Prior to Visit  Medication Sig Dispense Refill  .  aspirin EC 81 MG tablet Take 1 tablet (81 mg total) by mouth daily. 90 tablet 11  . atorvastatin (LIPITOR) 10 MG tablet TAKE ONE TABLET BY MOUTH ONCE DAILY 90 tablet 0  . Blood Glucose Monitoring Suppl (ONE TOUCH ULTRA SYSTEM KIT) W/DEVICE KIT Use as directed to check blood sugar.  Diagnosis code E11.9 1 each 0  . glucose blood (ONE TOUCH TEST STRIPS) test strip Use as instructed once daily to check blood sugar.  Diagnosis code E11.9 100 each 12  . Lancets MISC Use as directed once daily to check blood sugar.  Diagnosis code E11.9 100 each 11  . metFORMIN (GLUCOPHAGE XR) 500 MG 24 hr tablet 3 tabs by mouth in the AM 270 tablet 3  . nortriptyline (PAMELOR) 50 MG capsule Take 50 mg by mouth 3 (three) times daily.    Marland Kitchen tiZANidine (ZANAFLEX) 4 MG tablet Take 4 mg by mouth 3 (three) times daily.    . valsartan-hydrochlorothiazide (DIOVAN-HCT) 320-25 MG tablet Take 1 tablet by mouth daily. 90 tablet 2   No current facility-administered medications on file prior to visit.   Review of Systems  Constitutional: Negative for unusual diaphoresis or night sweats HENT: Negative for ear swelling or discharge Eyes: Negative for worsening visual haziness  Respiratory: Negative for choking and stridor.   Gastrointestinal: Negative for distension or worsening eructation Genitourinary: Negative for retention  or change in urine volume.  Musculoskeletal: Negative for other MSK pain or swelling Skin: Negative for color change and worsening wound Neurological: Negative for tremors and numbness other than noted  Psychiatric/Behavioral: Negative for decreased concentration or agitation other than above       Objective:   Physical Exam BP 124/78 mmHg  Pulse 106  Temp(Src) 98.6 F (37 C) (Oral)  Resp 20  Wt 211 lb (95.709 kg)  SpO2 96% VS noted,  Constitutional: Pt appears in no apparent distress HENT: Head: NCAT.  Right Ear: External ear normal.  Left Ear: External ear normal.  Eyes: . Pupils are equal,  round, and reactive to light. Conjunctivae and EOM are normal Neck: Normal range of motion. Neck supple.  Cardiovascular: Normal rate and regular rhythm.   Pulmonary/Chest: Effort normal and breath sounds without rales or wheezing.  Abd:  Soft, NT, ND, + BS Spine nontender midline, has bilat lumbar paravertebral tender/spasm Neurological: Pt is alert. Not confused , motor grossly intact Skin: Skin is warm. No rash, no LE edema Psychiatric: Pt behavior is normal. No agitation.     Assessment & Plan:

## 2015-09-30 NOTE — Patient Instructions (Signed)
Please take all new medication as prescribed - the muscle relaxer as needed  Please continue all other medications as before, and refills have been done if requested.  Please have the pharmacy call with any other refills you may need.  Please continue your efforts at being more active, low cholesterol diet, and weight control.  Please keep your appointments with your specialists as you may have planned  Please go to the LAB in the Basement (turn left off the elevator) for the tests to be done today  You will be contacted by phone if any changes need to be made immediately.  Otherwise, you will receive a letter about your results with an explanation, but please check with MyChart first.  Please remember to sign up for MyChart if you have not done so, as this will be important to you in the future with finding out test results, communicating by private email, and scheduling acute appointments online when needed.  Please return in 6 months, or sooner if needed

## 2015-09-30 NOTE — Assessment & Plan Note (Signed)
Screen ab test abnormal, but f/u Hep C quant RNA negative, no further tx needed

## 2015-09-30 NOTE — Assessment & Plan Note (Signed)
stable overall by history and exam, recent data reviewed with pt, and pt to continue medical treatment as before,  to f/u any worsening symptoms or concerns  Last ldl 88 - to cont current stain and diet

## 2015-10-16 ENCOUNTER — Other Ambulatory Visit: Payer: Self-pay | Admitting: Internal Medicine

## 2015-10-20 NOTE — Telephone Encounter (Signed)
Pt had left a message on triage.   Contacted pt and informed that rx has been sent and should be ready for pick up today.

## 2015-11-05 DIAGNOSIS — N400 Enlarged prostate without lower urinary tract symptoms: Secondary | ICD-10-CM | POA: Diagnosis not present

## 2015-11-12 ENCOUNTER — Ambulatory Visit: Payer: Medicare Other | Admitting: Urology

## 2015-11-12 DIAGNOSIS — Z87442 Personal history of urinary calculi: Secondary | ICD-10-CM | POA: Diagnosis not present

## 2015-11-12 DIAGNOSIS — N5201 Erectile dysfunction due to arterial insufficiency: Secondary | ICD-10-CM | POA: Diagnosis not present

## 2015-11-13 DIAGNOSIS — G894 Chronic pain syndrome: Secondary | ICD-10-CM | POA: Diagnosis not present

## 2015-11-13 DIAGNOSIS — M545 Low back pain: Secondary | ICD-10-CM | POA: Diagnosis not present

## 2015-11-28 ENCOUNTER — Other Ambulatory Visit: Payer: Self-pay | Admitting: Internal Medicine

## 2015-12-02 DIAGNOSIS — T194XXA Foreign body in penis, initial encounter: Secondary | ICD-10-CM | POA: Diagnosis not present

## 2015-12-02 DIAGNOSIS — N5201 Erectile dysfunction due to arterial insufficiency: Secondary | ICD-10-CM | POA: Diagnosis not present

## 2016-01-02 ENCOUNTER — Encounter: Payer: Self-pay | Admitting: Internal Medicine

## 2016-01-02 ENCOUNTER — Ambulatory Visit (INDEPENDENT_AMBULATORY_CARE_PROVIDER_SITE_OTHER)
Admission: RE | Admit: 2016-01-02 | Discharge: 2016-01-02 | Disposition: A | Discharge status: Home / Self Care | Payer: Medicare Other | Source: Ambulatory Visit | Attending: Internal Medicine | Admitting: Internal Medicine

## 2016-01-02 ENCOUNTER — Ambulatory Visit (INDEPENDENT_AMBULATORY_CARE_PROVIDER_SITE_OTHER): Payer: Medicare Other | Admitting: Internal Medicine

## 2016-01-02 VITALS — BP 130/70 | HR 98 | Temp 98.5°F | Resp 20 | Wt 206.2 lb

## 2016-01-02 DIAGNOSIS — R05 Cough: Secondary | ICD-10-CM | POA: Diagnosis not present

## 2016-01-02 DIAGNOSIS — R0600 Dyspnea, unspecified: Secondary | ICD-10-CM | POA: Diagnosis not present

## 2016-01-02 DIAGNOSIS — R0602 Shortness of breath: Secondary | ICD-10-CM | POA: Diagnosis not present

## 2016-01-02 DIAGNOSIS — IMO0001 Reserved for inherently not codable concepts without codable children: Secondary | ICD-10-CM

## 2016-01-02 DIAGNOSIS — E1165 Type 2 diabetes mellitus with hyperglycemia: Secondary | ICD-10-CM

## 2016-01-02 DIAGNOSIS — I10 Essential (primary) hypertension: Secondary | ICD-10-CM

## 2016-01-02 DIAGNOSIS — R059 Cough, unspecified: Secondary | ICD-10-CM

## 2016-01-02 MED ORDER — HYDROCODONE-HOMATROPINE 5-1.5 MG/5ML PO SYRP: 5.0000 mL | ORAL_SOLUTION | Freq: Four times a day (QID) | ORAL | 0 refills | Status: AC | PRN

## 2016-01-02 MED ORDER — LEVOFLOXACIN 500 MG PO TABS: 500.0000 mg | ORAL_TABLET | Freq: Every day | ORAL | 0 refills | Status: AC

## 2016-01-02 NOTE — Assessment & Plan Note (Signed)
Mild to mod, c/w bronchitis vs pna, for antibx course, cough med prn,  to f/u any worsening symptoms or concerns 

## 2016-01-02 NOTE — Assessment & Plan Note (Signed)
stable overall by history and exam, recent data reviewed with pt, and pt to continue medical treatment as before,  to f/u any worsening symptoms or concerns Lab Results  Component Value Date   HGBA1C 8.8 (H) 09/30/2015

## 2016-01-02 NOTE — Assessment & Plan Note (Signed)
stable overall by history and exam, recent data reviewed with pt, and pt to continue medical treatment as before,  to f/u any worsening symptoms or concerns BP Readings from Last 3 Encounters:  01/02/16 130/70  09/30/15 124/78  04/01/15 136/80

## 2016-01-02 NOTE — Patient Instructions (Signed)
Your flu test was negative  Please take all new medication as prescribed - the antibiotic, and cough medicine  Please continue all other medications as before, and refills have been done if requested.  Please have the pharmacy call with any other refills you may need.  Please keep your appointments with your specialists as you may have planned  Please go to the XRAY Department in the Basement (go straight as you get off the elevator) for the x-ray testing  You will be contacted by phone if any changes need to be made immediately.  Otherwise, you will receive a letter about your results with an explanation, but please check with MyChart first.  Please remember to sign up for MyChart if you have not done so, as this will be important to you in the future with finding out test results, communicating by private email, and scheduling acute appointments online when needed.

## 2016-01-02 NOTE — Progress Notes (Signed)
Subjective:    Patient ID: Ethan Ellis, male    DOB: [birth year], 55 y.o.   MRN: [id number]  HPI Here with acute onset mild to mod 6 days ST, HA, general weakness and malaise, with prod cough greenish sputum, sob and doe, but Pt denies chest pain, wheezing, orthopnea, PND, increased LE swelling, palpitations, dizziness or syncope.  No known sick contacts. But just cant seem to shake.  Pt denies new neurological symptoms such as new headache, or facial or extremity weakness or numbness   Pt denies polydipsia, polyuria. Past Medical History:  Diagnosis Date  . Depression   . Diabetes mellitus without complication ([place])    type 2  . History of kidney stones    passed stone - no surgery  . Hyperlipidemia 03/10/2014  . Hypertension   . LBP (low back pain) 2009   Right  . Unspecified viral hepatitis C without hepatic coma 04/04/2015   Past Surgical History:  Procedure Laterality Date  . APPENDECTOMY    . BACK SURGERY     lower back   . SHOULDER SURGERY     right - x 3 surgery    reports that he has been smoking Cigarettes.  He has a 20.00 pack-year smoking history. He has never used smokeless tobacco. He reports that he uses drugs, including Marijuana. He reports that he does not drink alcohol. family history includes Diabetes in his father. Allergies  Allergen Reactions  . Naproxen     REACTION: mouth swelling. He can take advil and some other NSAIDs w/o problems   Current Outpatient Prescriptions on File Prior to Visit  Medication Sig Dispense Refill  . aspirin EC 81 MG tablet Take 1 tablet (81 mg total) by mouth daily. 90 tablet 11  . atorvastatin (LIPITOR) 10 MG tablet TAKE ONE TABLET BY MOUTH ONCE DAILY 90 tablet 3  . Blood Glucose Monitoring Suppl (ONE TOUCH ULTRA SYSTEM KIT) W/DEVICE KIT Use as directed to check blood sugar.  Diagnosis code E11.9 1 each 0  . cyclobenzaprine (FLEXERIL) 5 MG tablet Take 1 tablet (5 mg total) by mouth 3 (three) times daily as needed for muscle  spasms. 30 tablet 1  . glipiZIDE (GLIPIZIDE XL) 5 MG 24 hr tablet Take 1 tablet (5 mg total) by mouth daily with breakfast. 90 tablet 3  . glucose blood (ONE TOUCH TEST STRIPS) test strip Use as instructed once daily to check blood sugar.  Diagnosis code E11.9 100 each 12  . Lancets MISC Use as directed once daily to check blood sugar.  Diagnosis code E11.9 100 each 11  . metFORMIN (GLUCOPHAGE-XR) 500 MG 24 hr tablet Take 3 tablets (1,500 mg total) by mouth daily with breakfast. 270 tablet 3  . nortriptyline (PAMELOR) 50 MG capsule Take 50 mg by mouth 3 (three) times daily.    Marland Kitchen tiZANidine (ZANAFLEX) 4 MG tablet Take 4 mg by mouth 3 (three) times daily.    . valsartan-hydrochlorothiazide (DIOVAN-HCT) 320-25 MG tablet Take 1 tablet by mouth daily. 90 tablet 2   No current facility-administered medications on file prior to visit.    Review of Systems  Constitutional: Negative for unusual diaphoresis or night sweats HENT: Negative for ear swelling or discharge Eyes: Negative for worsening visual haziness  Respiratory: Negative for choking and stridor.   Gastrointestinal: Negative for distension or worsening eructation Genitourinary: Negative for retention or change in urine volume.  Musculoskeletal: Negative for other MSK pain or swelling Skin: Negative for color change and worsening  wound Neurological: Negative for tremors and numbness other than noted  Psychiatric/Behavioral: Negative for decreased concentration or agitation other than above       Objective:   Physical Exam BP 130/70   Pulse 98   Temp 98.5 F (36.9 C) (Oral)   Resp 20   Wt 206 lb 4 oz (93.6 kg)   SpO2 96%   BMI 30.46 kg/m  VS noted, mild ill, non toxic, ruddy complexion today Constitutional: Pt appears in no apparent distress HENT: Head: NCAT.  Right Ear: External ear normal.  Left Ear: External ear normal.  Right tm's with mild erythema, left TM normal color  Max sinus areas non tender.  Pharynx with mild  erythema, no exudate Eyes: . Pupils are equal, round, and reactive to light. Conjunctivae and EOM are normal Neck: Normal range of motion. Neck supple. with bilat submandul tender LA Cardiovascular: Normal rate and regular rhythm.   Pulmonary/Chest: Effort normal and breath sounds with few LLL rales but no wheezing.  Neurological: Pt is alert. Not confused , motor grossly intact Skin: Skin is warm. No rash, no LE edema Psychiatric: Pt behavior is normal. No agitation.   Influenza POCT - negative     Assessment & Plan:

## 2016-01-02 NOTE — Progress Notes (Signed)
Pre visit review using our clinic review tool, if applicable. No additional management support is needed unless otherwise documented below in the visit note. 

## 2016-01-02 NOTE — Assessment & Plan Note (Signed)
Also with few LLL rales, for cxr, f/p pna, no wheezing noted,  to f/u any worsening symptoms or concerns

## 2016-01-27 ENCOUNTER — Ambulatory Visit (INDEPENDENT_AMBULATORY_CARE_PROVIDER_SITE_OTHER): Payer: Medicare Other | Admitting: Internal Medicine

## 2016-01-27 ENCOUNTER — Encounter: Payer: Self-pay | Admitting: Internal Medicine

## 2016-01-27 VITALS — BP 138/80 | HR 67 | Temp 98.9°F | Resp 20 | Wt 205.0 lb

## 2016-01-27 DIAGNOSIS — E1165 Type 2 diabetes mellitus with hyperglycemia: Secondary | ICD-10-CM | POA: Diagnosis not present

## 2016-01-27 DIAGNOSIS — J309 Allergic rhinitis, unspecified: Secondary | ICD-10-CM | POA: Diagnosis not present

## 2016-01-27 DIAGNOSIS — J45901 Unspecified asthma with (acute) exacerbation: Secondary | ICD-10-CM | POA: Insufficient documentation

## 2016-01-27 DIAGNOSIS — I10 Essential (primary) hypertension: Secondary | ICD-10-CM

## 2016-01-27 DIAGNOSIS — IMO0001 Reserved for inherently not codable concepts without codable children: Secondary | ICD-10-CM

## 2016-01-27 DIAGNOSIS — J4521 Mild intermittent asthma with (acute) exacerbation: Secondary | ICD-10-CM

## 2016-01-27 MED ORDER — PREDNISONE 10 MG PO TABS: ORAL_TABLET | ORAL | 0 refills | Status: DC

## 2016-01-27 MED ORDER — ALBUTEROL SULFATE HFA 108 (90 BASE) MCG/ACT IN AERS: 2.0000 | INHALATION_SPRAY | Freq: Four times a day (QID) | RESPIRATORY_TRACT | 11 refills | Status: DC | PRN

## 2016-01-27 NOTE — Progress Notes (Signed)
Subjective:    Patient ID: Ethan Ellis, male    DOB: 1960/06/26, 55 y.o.   MRN: [id number]  HPI  Here to f/u with 1 mo onset gradually worsening mild to mod wheeze, sob, rare cough but recurring nasal congestion and drainage; without fever, pain, ST, swelling.  Pt denies fever, wt loss, night sweats, loss of appetite, or other constitutional symptoms  Pt denies chest pain, orthopnea, PND, increased LE swelling, palpitations, dizziness or syncope. Does have occasional ears popping as well   Pt denies new neurological symptoms such as new headache, or facial or extremity weakness or numbness   Pt denies polydipsia, polyuria,  Past Medical History:  Diagnosis Date  . Depression   . Diabetes mellitus without complication ([place])    type 2  . History of kidney stones    passed stone - no surgery  . Hyperlipidemia 03/10/2014  . Hypertension   . LBP (low back pain) 2009   Right  . Unspecified viral hepatitis C without hepatic coma 04/04/2015   Past Surgical History:  Procedure Laterality Date  . APPENDECTOMY    . BACK SURGERY     lower back   . SHOULDER SURGERY     right - x 3 surgery    reports that he has been smoking Cigarettes.  He has a 20.00 pack-year smoking history. He has never used smokeless tobacco. He reports that he uses drugs, including Marijuana. He reports that he does not drink alcohol. family history includes Diabetes in his father. Allergies  Allergen Reactions  . Naproxen     REACTION: mouth swelling. He can take advil and some other NSAIDs w/o problems   Current Outpatient Prescriptions on File Prior to Visit  Medication Sig Dispense Refill  . aspirin EC 81 MG tablet Take 1 tablet (81 mg total) by mouth daily. 90 tablet 11  . atorvastatin (LIPITOR) 10 MG tablet TAKE ONE TABLET BY MOUTH ONCE DAILY 90 tablet 3  . Blood Glucose Monitoring Suppl (ONE TOUCH ULTRA SYSTEM KIT) W/DEVICE KIT Use as directed to check blood sugar.  Diagnosis code E11.9 1 each 0  .  cyclobenzaprine (FLEXERIL) 5 MG tablet Take 1 tablet (5 mg total) by mouth 3 (three) times daily as needed for muscle spasms. 30 tablet 1  . glipiZIDE (GLIPIZIDE XL) 5 MG 24 hr tablet Take 1 tablet (5 mg total) by mouth daily with breakfast. 90 tablet 3  . glucose blood (ONE TOUCH TEST STRIPS) test strip Use as instructed once daily to check blood sugar.  Diagnosis code E11.9 100 each 12  . Lancets MISC Use as directed once daily to check blood sugar.  Diagnosis code E11.9 100 each 11  . metFORMIN (GLUCOPHAGE-XR) 500 MG 24 hr tablet Take 3 tablets (1,500 mg total) by mouth daily with breakfast. 270 tablet 3  . nortriptyline (PAMELOR) 50 MG capsule Take 50 mg by mouth 3 (three) times daily.    Marland Kitchen tiZANidine (ZANAFLEX) 4 MG tablet Take 4 mg by mouth 3 (three) times daily.    . valsartan-hydrochlorothiazide (DIOVAN-HCT) 320-25 MG tablet Take 1 tablet by mouth daily. 90 tablet 2   No current facility-administered medications on file prior to visit.    Review of Systems  Constitutional: Negative for unusual diaphoresis or night sweats HENT: Negative for ear swelling or discharge Eyes: Negative for worsening visual haziness  Respiratory: Negative for choking and stridor.   Gastrointestinal: Negative for distension or worsening eructation Genitourinary: Negative for retention or change in urine  volume.  Musculoskeletal: Negative for other MSK pain or swelling Skin: Negative for color change and worsening wound Neurological: Negative for tremors and numbness other than noted  Psychiatric/Behavioral: Negative for decreased concentration or agitation other than above   All other system neg per pt    Objective:   Physical Exam BP 138/80   Pulse 67   Temp 98.9 F (37.2 C) (Oral)   Resp 20   Wt 205 lb (93 kg)   SpO2 97%   BMI 30.27 kg/m  VS noted,  Constitutional: Pt appears in no apparent distress HENT: Head: NCAT.  Right Ear: External ear normal.  Left Ear: External ear normal.  Eyes: .  Pupils are equal, round, and reactive to light. Conjunctivae and EOM are normal Bilat tm's with mild erythema.  Max sinus areas non tender.  Pharynx with mild erythema, no exudate Neck: Normal range of motion. Neck supple.  Cardiovascular: Normal rate and regular rhythm.   Pulmonary/Chest: Effort normal and breath sounds decreased without rales but with mild bilat scattered wheezing.  Neurological: Pt is alert. Not confused , motor grossly intact Skin: Skin is warm. No rash, no LE edema Psychiatric: Pt behavior is normal. No agitation.  No other exam findings    Assessment & Plan:

## 2016-01-27 NOTE — Patient Instructions (Signed)
You had the steroid shot today  Please take all new medication as prescribed - the prednisone (just a short course)  Please take all new medication as prescribed - the advair twice per day, and the Albuterol inhaler as needed  You can also take Delsym OTC for cough, and/or Mucinex (or it's generic off brand) for congestion, and tylenol as needed for pain.  Please continue all other medications as before, and refills have been done if requested.  Please have the pharmacy call with any other refills you may need.  Please keep your appointments with your specialists as you may have planned

## 2016-01-27 NOTE — Progress Notes (Signed)
Pre visit review using our clinic review tool, if applicable. No additional management support is needed unless otherwise documented below in the visit note. 

## 2016-02-01 NOTE — Assessment & Plan Note (Signed)
stable overall by history and exam, recent data reviewed with pt, and pt to continue medical treatment as before,  to f/u any worsening symptoms or concerns BP Readings from Last 3 Encounters:  01/27/16 138/80  01/02/16 130/70  09/30/15 124/78

## 2016-02-01 NOTE — Assessment & Plan Note (Signed)
stable overall by history and exam, recent data reviewed with pt, and pt to continue medical treatment as before,  to f/u any worsening symptoms or concerns Lab Results  Component Value Date   HGBA1C 8.8 (H) 09/30/2015   Pt to call for onset polys or cbg > 200 with steroid tx

## 2016-02-01 NOTE — Assessment & Plan Note (Signed)
With seasonal flare, for depomedrol IM 80,, predpac asd,  to f/u any worsening symptoms or concerns

## 2016-02-01 NOTE — Assessment & Plan Note (Signed)
With mild seasonal allergic type worsening, for start advair, albut MDI prn,  to f/u any worsening symptoms or concerns

## 2016-05-04 DIAGNOSIS — Z23 Encounter for immunization: Secondary | ICD-10-CM | POA: Diagnosis not present

## 2016-05-17 DIAGNOSIS — M545 Low back pain: Secondary | ICD-10-CM | POA: Diagnosis not present

## 2016-05-17 DIAGNOSIS — G894 Chronic pain syndrome: Secondary | ICD-10-CM | POA: Diagnosis not present

## 2016-05-17 DIAGNOSIS — M5126 Other intervertebral disc displacement, lumbar region: Secondary | ICD-10-CM | POA: Diagnosis not present

## 2016-06-17 ENCOUNTER — Other Ambulatory Visit: Payer: Self-pay | Admitting: Internal Medicine

## 2016-06-17 NOTE — Telephone Encounter (Signed)
Done

## 2016-06-17 NOTE — Telephone Encounter (Signed)
Pt requesting refill of valsartan-hydrochlorothiazide (DIOVAN-HCT) 320-25 MG tablet

## 2016-09-14 ENCOUNTER — Ambulatory Visit (INDEPENDENT_AMBULATORY_CARE_PROVIDER_SITE_OTHER): Payer: Medicare Other | Admitting: Internal Medicine

## 2016-09-14 ENCOUNTER — Other Ambulatory Visit: Payer: Medicare Other

## 2016-09-14 ENCOUNTER — Encounter: Payer: Self-pay | Admitting: Internal Medicine

## 2016-09-14 VITALS — BP 142/90 | HR 94 | Ht 69.0 in | Wt 214.0 lb

## 2016-09-14 DIAGNOSIS — R21 Rash and other nonspecific skin eruption: Secondary | ICD-10-CM | POA: Diagnosis not present

## 2016-09-14 DIAGNOSIS — Z114 Encounter for screening for human immunodeficiency virus [HIV]: Secondary | ICD-10-CM | POA: Diagnosis not present

## 2016-09-14 DIAGNOSIS — E785 Hyperlipidemia, unspecified: Secondary | ICD-10-CM | POA: Diagnosis not present

## 2016-09-14 DIAGNOSIS — E1165 Type 2 diabetes mellitus with hyperglycemia: Secondary | ICD-10-CM | POA: Diagnosis not present

## 2016-09-14 DIAGNOSIS — IMO0001 Reserved for inherently not codable concepts without codable children: Secondary | ICD-10-CM

## 2016-09-14 DIAGNOSIS — Z23 Encounter for immunization: Secondary | ICD-10-CM

## 2016-09-14 DIAGNOSIS — I10 Essential (primary) hypertension: Secondary | ICD-10-CM | POA: Diagnosis not present

## 2016-09-14 DIAGNOSIS — N489 Disorder of penis, unspecified: Secondary | ICD-10-CM | POA: Insufficient documentation

## 2016-09-14 MED ORDER — ALBUTEROL SULFATE HFA 108 (90 BASE) MCG/ACT IN AERS: 2.0000 | INHALATION_SPRAY | Freq: Four times a day (QID) | RESPIRATORY_TRACT | 11 refills | Status: DC | PRN

## 2016-09-14 MED ORDER — NORTRIPTYLINE HCL 50 MG PO CAPS: 50.0000 mg | ORAL_CAPSULE | Freq: Three times a day (TID) | ORAL | 5 refills | Status: DC

## 2016-09-14 MED ORDER — CYCLOBENZAPRINE HCL 5 MG PO TABS: 5.0000 mg | ORAL_TABLET | Freq: Three times a day (TID) | ORAL | 1 refills | Status: DC | PRN

## 2016-09-14 MED ORDER — VALSARTAN-HYDROCHLOROTHIAZIDE 320-25 MG PO TABS: 1.0000 | ORAL_TABLET | Freq: Every day | ORAL | 3 refills | Status: DC

## 2016-09-14 MED ORDER — CLOTRIMAZOLE-BETAMETHASONE 1-0.05 % EX CREA: TOPICAL_CREAM | CUTANEOUS | 1 refills | Status: AC

## 2016-09-14 MED ORDER — GLIPIZIDE ER 5 MG PO TB24: 5.0000 mg | ORAL_TABLET | Freq: Every day | ORAL | 3 refills | Status: DC

## 2016-09-14 MED ORDER — METFORMIN HCL ER 500 MG PO TB24: 1500.0000 mg | ORAL_TABLET | Freq: Every day | ORAL | 3 refills | Status: DC

## 2016-09-14 MED ORDER — ATORVASTATIN CALCIUM 10 MG PO TABS: 10.0000 mg | ORAL_TABLET | Freq: Every day | ORAL | 3 refills | Status: DC

## 2016-09-14 NOTE — Assessment & Plan Note (Signed)
With a1c 8.8 last visit, good compliance with diet and meds per pt though not very active, o/w stable overall by history and exam, recent data reviewed with pt, and pt to continue medical treatment as before,  to f/u any worsening symptoms or concern, for f/u lab today

## 2016-09-14 NOTE — Assessment & Plan Note (Signed)
Mild elevated today, declines med change, o/w stable overall by history and exam, recent data reviewed with pt, and pt to continue medical treatment as before,  to f/u any worsening symptoms or concerns BP Readings from Last 3 Encounters:  09/14/16 (!) 142/90  01/27/16 138/80  01/02/16 130/70

## 2016-09-14 NOTE — Progress Notes (Signed)
Subjective:    Patient ID: Ethan Ellis, male    DOB: 1960/07/09, 56 y.o.   MRN: [id number]  HPI  Here to f/u; overall doing ok,  Pt denies chest pain, increasing sob or doe, wheezing, orthopnea, PND, increased LE swelling, palpitations, dizziness or syncope.  Pt denies new neurological symptoms such as new headache, or facial or extremity weakness or numbness.  Pt denies polydipsia, polyuria, or low sugar episode.  Pt states overall good compliance with meds, mostly trying to follow appropriate diet, with wt overall stable, not very active and does not think will be soon.  Trying to work more on diet, Sees urology next mo with PSA.  Son has proven Lyme dz on 1 mo antibiotic tx but he has no tick bites.  Does have unusual erythema to right lateral glans penis nontender but slight swelling involved for the past wk Past Medical History:  Diagnosis Date  . Depression   . Diabetes mellitus without complication ([place])    type 2  . History of kidney stones    passed stone - no surgery  . Hyperlipidemia 03/10/2014  . Hypertension   . LBP (low back pain) 2009   Right  . Unspecified viral hepatitis C without hepatic coma 04/04/2015   Past Surgical History:  Procedure Laterality Date  . APPENDECTOMY    . BACK SURGERY     lower back   . SHOULDER SURGERY     right - x 3 surgery    reports that he has been smoking Cigarettes.  He has a 20.00 pack-year smoking history. He has never used smokeless tobacco. He reports that he uses drugs, including Marijuana. He reports that he does not drink alcohol. family history includes Diabetes in his father. Allergies  Allergen Reactions  . Naproxen     REACTION: mouth swelling. He can take advil and some other NSAIDs w/o problems   Current Outpatient Prescriptions on File Prior to Visit  Medication Sig Dispense Refill  . aspirin EC 81 MG tablet Take 1 tablet (81 mg total) by mouth daily. 90 tablet 11  . Blood Glucose Monitoring Suppl (ONE TOUCH ULTRA  SYSTEM KIT) W/DEVICE KIT Use as directed to check blood sugar.  Diagnosis code E11.9 1 each 0  . glucose blood (ONE TOUCH TEST STRIPS) test strip Use as instructed once daily to check blood sugar.  Diagnosis code E11.9 100 each 12  . Lancets MISC Use as directed once daily to check blood sugar.  Diagnosis code E11.9 100 each 11  . tiZANidine (ZANAFLEX) 4 MG tablet Take 4 mg by mouth 3 (three) times daily.     No current facility-administered medications on file prior to visit.    Review of Systems  Constitutional: Negative for other unusual diaphoresis or sweats HENT: Negative for ear discharge or swelling Eyes: Negative for other worsening visual disturbances Respiratory: Negative for stridor or other swelling  Gastrointestinal: Negative for worsening distension or other blood Genitourinary: Negative for retention or other urinary change Musculoskeletal: Negative for other MSK pain or swelling Skin: Negative for color change or other new lesions Neurological: Negative for worsening tremors and other numbness  Psychiatric/Behavioral: Negative for worsening agitation or other fatigue All other system neg per pt    Objective:   Physical Exam BP (!) 142/90   Pulse 94   Ht 5' 9"  (1.753 m)   Wt 214 lb (97.1 kg)   SpO2 98%   BMI 31.60 kg/m  VS noted,  Constitutional: Pt  appears in NAD HENT: Head: NCAT.  Right Ear: External ear normal.  Left Ear: External ear normal.  Eyes: . Pupils are equal, round, and reactive to light. Conjunctivae and EOM are normal Nose: without d/c or deformity Neck: Neck supple. Gross normal ROM Cardiovascular: Normal rate and regular rhythm.   Pulmonary/Chest: Effort normal and breath sounds without rales or wheezing.  GU: glans penis right side with 1/2 cm non tender mild erythema with trace swelling Neurological: Pt is alert. At baseline orientation, motor grossly intact Skin: Skin is warm. No rashes, other new lesions, no LE edema Psychiatric: Pt  behavior is normal without agitation  No other exam findings Lab Results  Component Value Date   WBC 10.6 (H) 04/01/2015   HGB 15.8 04/01/2015   HCT 46.1 04/01/2015   PLT 241.0 04/01/2015   GLUCOSE 158 (H) 09/30/2015   CHOL 169 09/30/2015   TRIG 276.0 (H) 09/30/2015   HDL 30.40 (L) 09/30/2015   LDLDIRECT 114.0 09/30/2015   ALT 35 09/30/2015   AST 23 09/30/2015   NA 138 09/30/2015   K 3.4 (L) 09/30/2015   CL 99 09/30/2015   CREATININE 0.99 09/30/2015   BUN 14 09/30/2015   CO2 32 09/30/2015   TSH 1.07 04/01/2015   PSA 0.73 01/29/2009   HGBA1C 8.8 (H) 09/30/2015   MICROALBUR 1.1 04/01/2015       Assessment & Plan:

## 2016-09-14 NOTE — Assessment & Plan Note (Signed)
Goal ldl < 70, to cont diet, consider statin after lab f/u today

## 2016-09-14 NOTE — Patient Instructions (Addendum)
You had the Tdap tetanus shot today  Please take all new medication as prescribed - the lotrisone cream  Please continue all other medications as before, and refills have been done if requested.  Please have the pharmacy call with any other refills you may need.  Please continue your efforts at being more active, low cholesterol diet, and weight control.  You are otherwise up to date with prevention measures today.  Please keep your appointments with your specialists as you may have planned  Please go to the LAB in the Basement (turn left off the elevator) for the tests to be done today  You will be contacted by phone if any changes need to be made immediately.  Otherwise, you will receive a letter about your results with an explanation, but please check with MyChart first.  Please remember to sign up for MyChart if you have not done so, as this will be important to you in the future with finding out test results, communicating by private email, and scheduling acute appointments online when needed.  Please return in 6 months, or sooner if needed

## 2016-09-14 NOTE — Assessment & Plan Note (Signed)
Kimberly for lotrisone prn,  to f/u any worsening symptoms or concerns

## 2016-09-15 LAB — HIV ANTIBODY (ROUTINE TESTING W REFLEX): HIV 1&2 Ab, 4th Generation: NONREACTIVE

## 2016-10-15 DIAGNOSIS — N400 Enlarged prostate without lower urinary tract symptoms: Secondary | ICD-10-CM | POA: Diagnosis not present

## 2016-10-15 DIAGNOSIS — N521 Erectile dysfunction due to diseases classified elsewhere: Secondary | ICD-10-CM | POA: Diagnosis not present

## 2016-10-15 DIAGNOSIS — Z125 Encounter for screening for malignant neoplasm of prostate: Secondary | ICD-10-CM | POA: Diagnosis not present

## 2016-11-17 DIAGNOSIS — G894 Chronic pain syndrome: Secondary | ICD-10-CM | POA: Diagnosis not present

## 2016-11-17 DIAGNOSIS — M791 Myalgia: Secondary | ICD-10-CM | POA: Diagnosis not present

## 2016-11-17 DIAGNOSIS — M545 Low back pain: Secondary | ICD-10-CM | POA: Diagnosis not present

## 2016-11-17 DIAGNOSIS — Z6831 Body mass index (BMI) 31.0-31.9, adult: Secondary | ICD-10-CM | POA: Diagnosis not present

## 2016-11-23 ENCOUNTER — Other Ambulatory Visit (INDEPENDENT_AMBULATORY_CARE_PROVIDER_SITE_OTHER): Payer: Medicare Other

## 2016-11-23 ENCOUNTER — Ambulatory Visit (INDEPENDENT_AMBULATORY_CARE_PROVIDER_SITE_OTHER): Payer: Medicare Other | Admitting: Internal Medicine

## 2016-11-23 ENCOUNTER — Encounter: Payer: Self-pay | Admitting: Internal Medicine

## 2016-11-23 VITALS — BP 156/98 | HR 98 | Temp 98.2°F | Ht 69.0 in | Wt 214.0 lb

## 2016-11-23 DIAGNOSIS — E1165 Type 2 diabetes mellitus with hyperglycemia: Secondary | ICD-10-CM | POA: Diagnosis not present

## 2016-11-23 DIAGNOSIS — Z23 Encounter for immunization: Secondary | ICD-10-CM | POA: Diagnosis not present

## 2016-11-23 DIAGNOSIS — N320 Bladder-neck obstruction: Secondary | ICD-10-CM

## 2016-11-23 DIAGNOSIS — I10 Essential (primary) hypertension: Secondary | ICD-10-CM | POA: Diagnosis not present

## 2016-11-23 DIAGNOSIS — E785 Hyperlipidemia, unspecified: Secondary | ICD-10-CM

## 2016-11-23 DIAGNOSIS — IMO0001 Reserved for inherently not codable concepts without codable children: Secondary | ICD-10-CM

## 2016-11-23 LAB — BASIC METABOLIC PANEL
BUN: 19 mg/dL (ref 6–23)
CHLORIDE: 102 meq/L (ref 96–112)
CO2: 29 mEq/L (ref 19–32)
Calcium: 9.8 mg/dL (ref 8.4–10.5)
Creatinine, Ser: 1.03 mg/dL (ref 0.40–1.50)
GFR: 78.66 mL/min (ref >60.00)
Glucose, Bld: 166 mg/dL — ABNORMAL HIGH (ref 70–99)
Potassium: 4.2 mEq/L (ref 3.5–5.1)
SODIUM: 140 meq/L (ref 135–145)

## 2016-11-23 LAB — CBC WITH DIFFERENTIAL/PLATELET
BASOS ABS: 0.1 10*3/uL (ref 0.0–0.1)
Basophils Relative: 1.2 % (ref 0.0–3.0)
EOS ABS: 0.2 10*3/uL (ref 0.0–0.7)
Eosinophils Relative: 1.4 % (ref 0.0–5.0)
HCT: 45.3 % (ref 39.0–52.0)
Hemoglobin: 15.6 g/dL (ref 13.0–17.0)
LYMPHS ABS: 2.8 10*3/uL (ref 0.7–4.0)
Lymphocytes Relative: 23.7 % (ref 12.0–46.0)
MCHC: 34.5 g/dL (ref 30.0–36.0)
MCV: 91.5 fl (ref 78.0–100.0)
MONOS PCT: 4.2 % (ref 3.0–12.0)
Monocytes Absolute: 0.5 10*3/uL (ref 0.1–1.0)
NEUTROS ABS: 8.1 10*3/uL — AB (ref 1.4–7.7)
NEUTROS PCT: 69.5 % (ref 43.0–77.0)
PLATELETS: 244 10*3/uL (ref 150.0–400.0)
RBC: 4.95 Mil/uL (ref 4.22–5.81)
RDW: 12.6 % (ref 11.5–15.5)
WBC: 11.6 10*3/uL — ABNORMAL HIGH (ref 4.0–10.5)

## 2016-11-23 LAB — HEPATIC FUNCTION PANEL
ALK PHOS: 65 U/L (ref 39–117)
ALT: 58 U/L — AB (ref 0–53)
AST: 41 U/L — ABNORMAL HIGH (ref 0–37)
Albumin: 4.5 g/dL (ref 3.5–5.2)
BILIRUBIN DIRECT: 0.2 mg/dL (ref 0.0–0.3)
TOTAL PROTEIN: 6.6 g/dL (ref 6.0–8.3)
Total Bilirubin: 1 mg/dL (ref 0.2–1.2)

## 2016-11-23 LAB — URINALYSIS, ROUTINE W REFLEX MICROSCOPIC
Bilirubin Urine: NEGATIVE
Hgb urine dipstick: NEGATIVE
KETONES UR: NEGATIVE
Leukocytes, UA: NEGATIVE
Nitrite: NEGATIVE
PH: 6 (ref 5.0–8.0)
RBC / HPF: NONE SEEN (ref 0–?)
SPECIFIC GRAVITY, URINE: 1.02 (ref 1.000–1.030)
Total Protein, Urine: NEGATIVE
UROBILINOGEN UA: 0.2 (ref 0.0–1.0)
Urine Glucose: NEGATIVE

## 2016-11-23 LAB — LIPID PANEL
Cholesterol: 178 mg/dL (ref 0–200)
HDL: 30.5 mg/dL — ABNORMAL LOW (ref >39.00)
NONHDL: 147.74
Total CHOL/HDL Ratio: 6
Triglycerides: 370 mg/dL — ABNORMAL HIGH (ref 0.0–149.0)
VLDL: 74 mg/dL — ABNORMAL HIGH (ref 0.0–40.0)

## 2016-11-23 LAB — MICROALBUMIN / CREATININE URINE RATIO
CREATININE, U: 215.1 mg/dL
MICROALB/CREAT RATIO: 0.8 mg/g (ref 0.0–30.0)
Microalb, Ur: 1.7 mg/dL (ref 0.0–1.9)

## 2016-11-23 LAB — TSH: TSH: 0.69 u[IU]/mL (ref 0.35–4.50)

## 2016-11-23 LAB — PSA: PSA: 1.09 ng/mL (ref 0.10–4.00)

## 2016-11-23 LAB — HEMOGLOBIN A1C: Hgb A1c MFr Bld: 8.6 % — ABNORMAL HIGH (ref 4.6–6.5)

## 2016-11-23 LAB — LDL CHOLESTEROL, DIRECT: Direct LDL: 96 mg/dL

## 2016-11-23 NOTE — Patient Instructions (Addendum)

## 2016-11-23 NOTE — Progress Notes (Signed)
Subjective:    Patient ID: Ethan Ellis, male    DOB: 06-Jun-1960, 56 y.o.   MRN: [id number]  HPI  Here for yearly f/u;  Overall doing ok;  Pt denies Chest pain, worsening SOB, DOE, wheezing, orthopnea, PND, worsening LE edema, palpitations, dizziness or syncope.  Pt denies neurological change such as new headache, facial or extremity weakness.  Pt denies polydipsia, polyuria, or low sugar symptoms. Pt states overall good compliance with treatment and medications, good tolerability, and has been trying to follow appropriate diet.  Pt denies worsening depressive symptoms, suicidal ideation or panic. No fever, night sweats, wt loss, loss of appetite, or other constitutional symptoms.  Pt states good ability with ADL's, has low fall risk, home safety reviewed and adequate, no other significant changes in hearing or vision, and only occasionally active with exercise.  No new complaints Past Medical History:  Diagnosis Date  . Depression   . Diabetes mellitus without complication ([place])    type 2  . History of kidney stones    passed stone - no surgery  . Hyperlipidemia 03/10/2014  . Hypertension   . LBP (low back pain) 2009   Right  . Unspecified viral hepatitis C without hepatic coma 04/04/2015   Past Surgical History:  Procedure Laterality Date  . APPENDECTOMY    . BACK SURGERY     lower back   . SHOULDER SURGERY     right - x 3 surgery    reports that he has been smoking Cigarettes.  He has a 20.00 pack-year smoking history. He has never used smokeless tobacco. He reports that he uses drugs, including Marijuana. He reports that he does not drink alcohol. family history includes Diabetes in his father. Allergies  Allergen Reactions  . Naproxen     REACTION: mouth swelling. He can take advil and some other NSAIDs w/o problems   Current Outpatient Prescriptions on File Prior to Visit  Medication Sig Dispense Refill  . albuterol (PROVENTIL HFA;VENTOLIN HFA) 108 (90 Base) MCG/ACT  inhaler Inhale 2 puffs into the lungs every 6 (six) hours as needed for wheezing or shortness of breath. 1 Inhaler 11  . aspirin EC 81 MG tablet Take 1 tablet (81 mg total) by mouth daily. 90 tablet 11  . atorvastatin (LIPITOR) 10 MG tablet Take 1 tablet (10 mg total) by mouth daily. 90 tablet 3  . Blood Glucose Monitoring Suppl (ONE TOUCH ULTRA SYSTEM KIT) W/DEVICE KIT Use as directed to check blood sugar.  Diagnosis code E11.9 1 each 0  . clotrimazole-betamethasone (LOTRISONE) cream Use as directed to affected area twice daily as needed 15 g 1  . cyclobenzaprine (FLEXERIL) 5 MG tablet Take 1 tablet (5 mg total) by mouth 3 (three) times daily as needed for muscle spasms. 30 tablet 1  . glucose blood (ONE TOUCH TEST STRIPS) test strip Use as instructed once daily to check blood sugar.  Diagnosis code E11.9 100 each 12  . Lancets MISC Use as directed once daily to check blood sugar.  Diagnosis code E11.9 100 each 11  . metFORMIN (GLUCOPHAGE-XR) 500 MG 24 hr tablet Take 3 tablets (1,500 mg total) by mouth daily with breakfast. 270 tablet 3  . nortriptyline (PAMELOR) 50 MG capsule Take 1 capsule (50 mg total) by mouth 3 (three) times daily. 90 capsule 5  . tiZANidine (ZANAFLEX) 4 MG tablet Take 4 mg by mouth 3 (three) times daily.    . valsartan-hydrochlorothiazide (DIOVAN-HCT) 320-25 MG tablet Take 1 tablet by  mouth daily. 90 tablet 3   No current facility-administered medications on file prior to visit.    Review of Systems  Constitutional: Negative for other unusual diaphoresis or sweats HENT: Negative for ear discharge or swelling Eyes: Negative for other worsening visual disturbances Respiratory: Negative for stridor or other swelling  Gastrointestinal: Negative for worsening distension or other blood Genitourinary: Negative for retention or other urinary change Musculoskeletal: Negative for other MSK pain or swelling Skin: Negative for color change or other new lesions Neurological:  Negative for worsening tremors and other numbness  Psychiatric/Behavioral: Negative for worsening agitation or other fatigue All other system neg per pt    Objective:   Physical Exam BP (!) 156/98   Pulse 98   Temp 98.2 F (36.8 C) (Oral)   Ht 5' 9"  (1.753 m)   Wt 214 lb (97.1 kg)   SpO2 98%   BMI 31.60 kg/m  VS noted,  Constitutional: Pt is oriented to person, place, and time. Appears well-developed and well-nourished, in no significant distress and comfortable Head: Normocephalic and atraumatic  Eyes: Conjunctivae and EOM are normal. Pupils are equal, round, and reactive to light Right Ear: External ear normal without discharge Left Ear: External ear normal without discharge Nose: Nose without discharge or deformity Mouth/Throat: Oropharynx is without other ulcerations and moist  Neck: Normal range of motion. Neck supple. No JVD present. No tracheal deviation present or significant neck LA or mass Cardiovascular: Normal rate, regular rhythm, normal heart sounds and intact distal pulses.   Pulmonary/Chest: WOB normal and breath sounds without rales or wheezing  Abdominal: Soft. Bowel sounds are normal. NT. No HSM  Musculoskeletal: Normal range of motion. Exhibits no edema Lymphadenopathy: Has no other cervical adenopathy.  Neurological: Pt is alert and oriented to person, place, and time. Pt has normal reflexes. No cranial nerve deficit. Motor grossly intact, Gait intact Skin: Skin is warm and dry. No rash noted or new ulcerations Psychiatric:  Has normal mood and affect. Behavior is normal without agitation No other exam findings      Assessment & Plan:

## 2016-11-24 ENCOUNTER — Encounter: Payer: Self-pay | Admitting: Internal Medicine

## 2016-11-24 ENCOUNTER — Telehealth: Payer: Self-pay

## 2016-11-24 ENCOUNTER — Other Ambulatory Visit: Payer: Self-pay | Admitting: Internal Medicine

## 2016-11-24 MED ORDER — GLIPIZIDE ER 10 MG PO TB24: 10.0000 mg | ORAL_TABLET | Freq: Every day | ORAL | 3 refills | Status: DC

## 2016-11-24 NOTE — Telephone Encounter (Signed)
Pt has been informed and expressed understanding.  

## 2016-11-24 NOTE — Telephone Encounter (Signed)
-----   Message from Biagio Borg, MD sent at 11/24/2016  1:55 PM EDT ----- Letter sent, cont same tx except   The test results show that your current treatment is OK, except the A1c is still too high Please increase your glipizide ER to 10 mg per day.  A new prescription will be sent, and you should hear from the office as well.Ethan Ellis to please inform pt, I will do rx

## 2016-11-27 NOTE — Assessment & Plan Note (Signed)
stable overall by history and exam, recent data reviewed with pt, and pt to continue medical treatment as before,  to f/u any worsening symptoms or concerns, for f/u labs 

## 2016-11-27 NOTE — Assessment & Plan Note (Signed)
stable overall by history and exam, recent data reviewed with pt, and pt to continue medical treatment as before,  to f/u any worsening symptoms or concern, for f/u lab today 

## 2016-11-27 NOTE — Assessment & Plan Note (Signed)
BP Readings from Last 3 Encounters:  11/23/16 (!) 156/98  09/14/16 (!) 142/90  01/27/16 138/80  mild elev today but pt states BP at home < 140/90 and out of med for 3 days

## 2016-12-15 ENCOUNTER — Telehealth: Payer: Self-pay | Admitting: Internal Medicine

## 2016-12-15 MED ORDER — IRBESARTAN-HYDROCHLOROTHIAZIDE 150-12.5 MG PO TABS: 2.0000 | ORAL_TABLET | Freq: Every day | ORAL | 3 refills | Status: DC

## 2016-12-15 NOTE — Telephone Encounter (Signed)
Pt has been notified.

## 2016-12-15 NOTE — Telephone Encounter (Signed)
Pt called regarding his valsartan-hydrochlorothiazide (DIOVAN-HCT) 320-25 MG tablet that has been recalled. Please advise on what else he can take.

## 2016-12-15 NOTE — Telephone Encounter (Signed)
Ok to change diovan hct to avalide - done erx

## 2016-12-17 MED ORDER — LOSARTAN POTASSIUM-HCTZ 100-25 MG PO TABS: 1.0000 | ORAL_TABLET | Freq: Every day | ORAL | 3 refills | Status: DC

## 2016-12-17 NOTE — Telephone Encounter (Signed)
Pt called back say that this medication was very expensive.

## 2016-12-17 NOTE — Addendum Note (Signed)
Addended by: Biagio Borg on: 12/17/2016 12:49 PM   Modules accepted: Orders

## 2016-12-17 NOTE — Telephone Encounter (Signed)
[place] for change to losartan hct 100-25= done erx

## 2016-12-17 NOTE — Telephone Encounter (Signed)
Pt has been informed, expressed understanding.

## 2017-03-17 ENCOUNTER — Other Ambulatory Visit: Payer: Self-pay | Admitting: Internal Medicine

## 2017-03-17 MED ORDER — HYDROCHLOROTHIAZIDE 25 MG PO TABS: 25.0000 mg | ORAL_TABLET | Freq: Every day | ORAL | 3 refills | Status: DC

## 2017-03-17 MED ORDER — LOSARTAN POTASSIUM 100 MG PO TABS: 100.0000 mg | ORAL_TABLET | Freq: Every day | ORAL | 3 refills | Status: DC

## 2017-03-17 NOTE — Telephone Encounter (Signed)
Ok to change losartan hct 100/25 to component meds as combination is on back order

## 2017-05-14 DIAGNOSIS — J0100 Acute maxillary sinusitis, unspecified: Secondary | ICD-10-CM | POA: Diagnosis not present

## 2017-05-14 DIAGNOSIS — R0602 Shortness of breath: Secondary | ICD-10-CM | POA: Diagnosis not present

## 2017-05-14 DIAGNOSIS — E1165 Type 2 diabetes mellitus with hyperglycemia: Secondary | ICD-10-CM | POA: Diagnosis not present

## 2017-05-14 DIAGNOSIS — J205 Acute bronchitis due to respiratory syncytial virus: Secondary | ICD-10-CM | POA: Diagnosis not present

## 2017-05-17 DIAGNOSIS — Z6831 Body mass index (BMI) 31.0-31.9, adult: Secondary | ICD-10-CM | POA: Diagnosis not present

## 2017-05-17 DIAGNOSIS — I10 Essential (primary) hypertension: Secondary | ICD-10-CM | POA: Diagnosis not present

## 2017-05-17 DIAGNOSIS — G894 Chronic pain syndrome: Secondary | ICD-10-CM | POA: Diagnosis not present

## 2017-05-23 ENCOUNTER — Other Ambulatory Visit (INDEPENDENT_AMBULATORY_CARE_PROVIDER_SITE_OTHER): Payer: Medicare Other

## 2017-05-23 ENCOUNTER — Encounter: Payer: Self-pay | Admitting: Internal Medicine

## 2017-05-23 ENCOUNTER — Ambulatory Visit (INDEPENDENT_AMBULATORY_CARE_PROVIDER_SITE_OTHER): Payer: Medicare Other | Admitting: Internal Medicine

## 2017-05-23 VITALS — BP 126/84 | HR 67 | Temp 98.5°F | Ht 69.0 in | Wt 206.0 lb

## 2017-05-23 DIAGNOSIS — I10 Essential (primary) hypertension: Secondary | ICD-10-CM

## 2017-05-23 DIAGNOSIS — E785 Hyperlipidemia, unspecified: Secondary | ICD-10-CM | POA: Diagnosis not present

## 2017-05-23 DIAGNOSIS — E1165 Type 2 diabetes mellitus with hyperglycemia: Secondary | ICD-10-CM

## 2017-05-23 DIAGNOSIS — M7712 Lateral epicondylitis, left elbow: Secondary | ICD-10-CM

## 2017-05-23 LAB — LDL CHOLESTEROL, DIRECT: LDL DIRECT: 75 mg/dL

## 2017-05-23 LAB — LIPID PANEL
Cholesterol: 157 mg/dL (ref 0–200)
HDL: 41 mg/dL (ref >39.00)
NONHDL: 115.58
Total CHOL/HDL Ratio: 4
Triglycerides: 395 mg/dL — ABNORMAL HIGH (ref 0.0–149.0)
VLDL: 79 mg/dL — ABNORMAL HIGH (ref 0.0–40.0)

## 2017-05-23 LAB — HEPATIC FUNCTION PANEL
ALBUMIN: 4.3 g/dL (ref 3.5–5.2)
ALT: 21 U/L (ref 0–53)
AST: 14 U/L (ref 0–37)
Alkaline Phosphatase: 51 U/L (ref 39–117)
BILIRUBIN DIRECT: 0.1 mg/dL (ref 0.0–0.3)
BILIRUBIN TOTAL: 0.6 mg/dL (ref 0.2–1.2)
Total Protein: 7 g/dL (ref 6.0–8.3)

## 2017-05-23 LAB — BASIC METABOLIC PANEL
BUN: 17 mg/dL (ref 6–23)
CHLORIDE: 103 meq/L (ref 96–112)
CO2: 30 mEq/L (ref 19–32)
Calcium: 9.9 mg/dL (ref 8.4–10.5)
Creatinine, Ser: 0.89 mg/dL (ref 0.40–1.50)
GFR: 92.94 mL/min (ref >60.00)
Glucose, Bld: 125 mg/dL — ABNORMAL HIGH (ref 70–99)
POTASSIUM: 4.2 meq/L (ref 3.5–5.1)
SODIUM: 140 meq/L (ref 135–145)

## 2017-05-23 LAB — HEMOGLOBIN A1C: Hgb A1c MFr Bld: 8 % — ABNORMAL HIGH (ref 4.6–6.5)

## 2017-05-23 NOTE — Assessment & Plan Note (Signed)
stable overall by history and exam, recent data reviewed with pt, and pt to continue medical treatment as before,  to f/u any worsening symptoms or concerns BP Readings from Last 3 Encounters:  05/23/17 126/84  11/23/16 (!) 156/98  09/14/16 (!) 142/90

## 2017-05-23 NOTE — Assessment & Plan Note (Addendum)
stable overall by history and exam, recent data reviewed with pt, and pt to continue medical treatment as before,  to f/u any worsening symptoms or concerns  

## 2017-05-23 NOTE — Assessment & Plan Note (Signed)
Mild to mod, for sport med referral for possible steroid injection,  to f/u any worsening symptoms or concerns

## 2017-05-23 NOTE — Patient Instructions (Addendum)
You will be contacted regarding the referral for: Eye doctor, and Sports medicine (although you can make an appt at the checkout desk as you leave today as well)  Please continue all other medications as before, and refills have been done if requested.  Please have the pharmacy call with any other refills you may need.  Please continue your efforts at being more active, low cholesterol idabetic diet, and weight control.  Please keep your appointments with your specialists as you may have planned  Please go to the LAB in the Basement (turn left off the elevator) for the tests to be done today  You will be contacted by phone if any changes need to be made immediately.  Otherwise, you will receive a letter about your results with an explanation, but please check with MyChart first.  Please return in 6 months, or sooner if needed

## 2017-05-23 NOTE — Assessment & Plan Note (Signed)
stable overall by history and exam, for lower chol diet, and pt to continue medical treatment as before,  to f/u any worsening symptoms or concerns

## 2017-05-23 NOTE — Progress Notes (Signed)
 Subjective:    Patient ID: Ethan Ellis, male    DOB: 08/16/1960, 57 y.o.   MRN: [id number]  HPI  Here to f/u; overall doing ok,  Pt denies chest pain, increasing sob or doe, wheezing, orthopnea, PND, increased LE swelling, palpitations, dizziness or syncope.  Pt denies new neurological symptoms such as new headache, or facial or extremity weakness or numbness.  Pt denies polydipsia, polyuria, or low sugar episode.  Pt states overall good compliance with meds, mostly trying to follow appropriate diet, with wt overall stable,   Does have several wks ongoing nasal allergy symptoms with clearish congestion, itch and sneezing, without fever, pain, ST, cough, swelling or wheezing, seen and tx 10 days ago with prednisone.  Also with pain, swelling tender to left elbow but also with some numbness and ? Grip strength less to left hand, Has also some intermittent left postlat neck pain but minor only last few wks.  Past Medical History:  Diagnosis Date  . Depression   . Diabetes mellitus without complication (HCC)    type 2  . History of kidney stones    passed stone - no surgery  . Hyperlipidemia 03/10/2014  . Hypertension   . LBP (low back pain) 2009   Right  . Unspecified viral hepatitis C without hepatic coma 04/04/2015   Past Surgical History:  Procedure Laterality Date  . APPENDECTOMY    . BACK SURGERY     lower back   . SHOULDER SURGERY     right - x 3 surgery    reports that he has been smoking cigarettes.  He has a 20.00 pack-year smoking history. he has never used smokeless tobacco. He reports that he uses drugs. Drug: Marijuana. He reports that he does not drink alcohol. family history includes Diabetes in his father. Allergies  Allergen Reactions  . Naproxen     REACTION: mouth swelling. He can take advil and some other NSAIDs w/o problems   Current Outpatient Medications on File Prior to Visit  Medication Sig Dispense Refill  . albuterol (PROVENTIL HFA;VENTOLIN HFA) 108 (90  Base) MCG/ACT inhaler Inhale 2 puffs into the lungs every 6 (six) hours as needed for wheezing or shortness of breath. 1 Inhaler 11  . aspirin EC 81 MG tablet Take 1 tablet (81 mg total) by mouth daily. 90 tablet 11  . atorvastatin (LIPITOR) 10 MG tablet Take 1 tablet (10 mg total) by mouth daily. 90 tablet 3  . Blood Glucose Monitoring Suppl (ONE TOUCH ULTRA SYSTEM KIT) W/DEVICE KIT Use as directed to check blood sugar.  Diagnosis code E11.9 1 each 0  . clotrimazole-betamethasone (LOTRISONE) cream Use as directed to affected area twice daily as needed 15 g 1  . cyclobenzaprine (FLEXERIL) 5 MG tablet Take 1 tablet (5 mg total) by mouth 3 (three) times daily as needed for muscle spasms. 30 tablet 1  . glipiZIDE (GLUCOTROL XL) 10 MG 24 hr tablet Take 1 tablet (10 mg total) by mouth daily with breakfast. 90 tablet 3  . glucose blood (ONE TOUCH TEST STRIPS) test strip Use as instructed once daily to check blood sugar.  Diagnosis code E11.9 100 each 12  . hydrochlorothiazide (HYDRODIURIL) 25 MG tablet Take 1 tablet (25 mg total) by mouth daily. 90 tablet 3  . Lancets MISC Use as directed once daily to check blood sugar.  Diagnosis code E11.9 100 each 11  . losartan (COZAAR) 100 MG tablet Take 1 tablet (100 mg total) by mouth daily. 90   tablet 3  . metFORMIN (GLUCOPHAGE-XR) 500 MG 24 hr tablet Take 3 tablets (1,500 mg total) by mouth daily with breakfast. 270 tablet 3  . nortriptyline (PAMELOR) 50 MG capsule Take 1 capsule (50 mg total) by mouth 3 (three) times daily. 90 capsule 5  . tiZANidine (ZANAFLEX) 4 MG tablet Take 4 mg by mouth 3 (three) times daily.     No current facility-administered medications on file prior to visit.    Review of Systems  Constitutional: Negative for other unusual diaphoresis or sweats HENT: Negative for ear discharge or swelling Eyes: Negative for other worsening visual disturbances Respiratory: Negative for stridor or other swelling  Gastrointestinal: Negative for  worsening distension or other blood Genitourinary: Negative for retention or other urinary change Musculoskeletal: Negative for other MSK pain or swelling Skin: Negative for color change or other new lesions Neurological: Negative for worsening tremors and other numbness  Psychiatric/Behavioral: Negative for worsening agitation or other fatigue All other system neg per pt    Objective:   Physical Exam BP 126/84   Pulse 67   Temp 98.5 F (36.9 C) (Oral)   Ht 5' 9" (1.753 m)   Wt 206 lb (93.4 kg)   SpO2 98%   BMI 30.42 kg/m  VS noted,  Constitutional: Pt appears in NAD HENT: Head: NCAT.  Right Ear: External ear normal.  Left Ear: External ear normal.  Eyes: . Pupils are equal, round, and reactive to light. Conjunctivae and EOM are normal Nose: without d/c or deformity Neck: Neck supple. Gross normal ROM Cardiovascular: Normal rate and regular rhythm.   Pulmonary/Chest: Effort normal and breath sounds without rales or wheezing.  Left elbow with tender swelling at lateral epicondyle, LUE o/w neurovasc intact Neurological: Pt is alert. At baseline orientation, motor grossly intact Skin: Skin is warm. No rashes, other new lesions, no LE edema Psychiatric: Pt behavior is normal without agitation  No other exam findings  Lab Results  Component Value Date   WBC 11.6 (H) 11/23/2016   HGB 15.6 11/23/2016   HCT 45.3 11/23/2016   PLT 244.0 11/23/2016   GLUCOSE 166 (H) 11/23/2016   CHOL 178 11/23/2016   TRIG 370.0 (H) 11/23/2016   HDL 30.50 (L) 11/23/2016   LDLDIRECT 96.0 11/23/2016   ALT 58 (H) 11/23/2016   AST 41 (H) 11/23/2016   NA 140 11/23/2016   K 4.2 11/23/2016   CL 102 11/23/2016   CREATININE 1.03 11/23/2016   BUN 19 11/23/2016   CO2 29 11/23/2016   TSH 0.69 11/23/2016   PSA 1.09 11/23/2016   HGBA1C 8.6 (H) 11/23/2016   MICROALBUR 1.7 11/23/2016       Assessment & Plan:

## 2017-05-24 ENCOUNTER — Telehealth: Payer: Self-pay

## 2017-05-24 ENCOUNTER — Other Ambulatory Visit: Payer: Self-pay | Admitting: Internal Medicine

## 2017-05-24 ENCOUNTER — Encounter: Payer: Self-pay | Admitting: Internal Medicine

## 2017-05-24 MED ORDER — EMPAGLIFLOZIN 25 MG PO TABS: 25.0000 mg | ORAL_TABLET | Freq: Every day | ORAL | 3 refills | Status: DC

## 2017-05-24 NOTE — Telephone Encounter (Signed)
-----   Message from Biagio Borg, MD sent at 05/24/2017  8:13 AM EDT ----- Letter not sent due to printer not working correctly, cont same tx except  The test results show that your current treatment is OK, except the A1c is too high.  Please add a medication called Jardiance, as this works with your other medication to bring down the sugar.  I will send a prescription.  Since this is a branded medication, please have the pharmacy call us with the name of another medication in that family if Vania Rea is not covered with your insurance. You should also hear from the office.Redmond Baseman to please inform pt, I will do rx

## 2017-05-24 NOTE — Telephone Encounter (Signed)
Called pt, LVM.   CRM created.  

## 2017-05-25 NOTE — Progress Notes (Signed)
Can you print most recent letter

## 2017-05-31 ENCOUNTER — Encounter: Payer: Self-pay | Admitting: Internal Medicine

## 2017-06-09 ENCOUNTER — Encounter: Payer: Self-pay | Admitting: Family Medicine

## 2017-06-09 ENCOUNTER — Ambulatory Visit (INDEPENDENT_AMBULATORY_CARE_PROVIDER_SITE_OTHER): Payer: Medicare Other | Admitting: Family Medicine

## 2017-06-09 VITALS — BP 139/86 | HR 104 | Temp 98.5°F | Ht 69.0 in | Wt 204.0 lb

## 2017-06-09 DIAGNOSIS — M5412 Radiculopathy, cervical region: Secondary | ICD-10-CM

## 2017-06-09 MED ORDER — PREDNISONE 5 MG PO TABS: ORAL_TABLET | ORAL | 0 refills | Status: DC

## 2017-06-09 NOTE — Progress Notes (Signed)
Ethan Ellis - 57 y.o. male MRN [id number]  Date of birth: Sep 26, 1960  SUBJECTIVE:  Including CC & ROS.  Chief Complaint  Patient presents with  . Left arm pain   Ethan Ellis is a 57 y.o. male that is presenting with left arm pain. Pain has been ongoing for three weeks. He tried to catch himself and extended his arm down. He noticed the pain immediately after. Pain is located left lateral aspect of his neck and extends to his hand. Admits to tingling and numbness. Denies swelling and tenderness. Admits to decrease range of motion.  Pain is mild to severe. He has been taking tylenol.     Review of Systems  Constitutional: Negative for fever.  HENT: Negative for congestion.   Respiratory: Negative for cough.   Cardiovascular: Negative for chest pain.  Gastrointestinal: Negative for abdominal pain.  Musculoskeletal: Positive for neck pain.  Skin: Negative for color change.  Neurological: Positive for numbness.  Hematological: Negative for adenopathy.  Psychiatric/Behavioral: Negative for agitation.    HISTORY: Past Medical, Surgical, Social, and Family History Reviewed & Updated per EMR.   Pertinent Historical Findings include:  Past Medical History:  Diagnosis Date  . Depression   . Diabetes mellitus without complication ([address])    type 2  . History of kidney stones    passed stone - no surgery  . Hyperlipidemia 03/10/2014  . Hypertension   . LBP (low back pain) 2009   Right  . Unspecified viral hepatitis C without hepatic coma 04/04/2015    Past Surgical History:  Procedure Laterality Date  . APPENDECTOMY    . BACK SURGERY     lower back   . SHOULDER SURGERY     right - x 3 surgery    Allergies  Allergen Reactions  . Naproxen     REACTION: mouth swelling. He can take advil and some other NSAIDs w/o problems    Family History  Problem Relation Age of Onset  . Diabetes Father   . Colon cancer Neg Hx   . Colon polyps Neg Hx   . Rectal cancer Neg Hx   .  Stomach cancer Neg Hx      Social History   Socioeconomic History  . Marital status: Married    Spouse name: Not on file  . Number of children: Not on file  . Years of education: Not on file  . Highest education level: Not on file  Occupational History  . Not on file  Social Needs  . Financial resource strain: Not on file  . Food insecurity:    Worry: Not on file    Inability: Not on file  . Transportation needs:    Medical: Not on file    Non-medical: Not on file  Tobacco Use  . Smoking status: Current Every Day Smoker    Packs/day: 0.50    Years: 40.00    Pack years: 20.00    Types: Cigarettes  . Smokeless tobacco: Never Used  Substance and Sexual Activity  . Alcohol use: No    Alcohol/week: 0.0 oz  . Drug use: Yes    Types: Marijuana    Comment: states he stopped  . Sexual activity: Yes  Lifestyle  . Physical activity:    Days per week: Not on file    Minutes per session: Not on file  . Stress: Not on file  Relationships  . Social connections:    Talks on phone: Not on file  Gets together: Not on file    Attends religious service: Not on file    Active member of club or organization: Not on file    Attends meetings of clubs or organizations: Not on file    Relationship status: Not on file  . Intimate partner violence:    Fear of current or ex partner: Not on file    Emotionally abused: Not on file    Physically abused: Not on file    Forced sexual activity: Not on file  Other Topics Concern  . Not on file  Social History Narrative   Job w/Trucking company; disabled now 2010   Regular Exercise -  NO           PHYSICAL EXAM:  VS: BP 139/86 (BP Location: Left Arm, Patient Position: Sitting, Cuff Size: Normal)   Pulse (!) 104   Temp 98.5 F (36.9 C) (Oral)   Ht 5\' 9"  (1.753 m)   Wt 204 lb (92.5 kg)   SpO2 95%   BMI 30.13 kg/m  Physical Exam Gen: NAD, alert, cooperative with exam, well-appearing ENT: normal lips, normal nasal mucosa,  Eye:  normal EOM, normal conjunctiva and lids CV:  no edema, +2 pedal pulses   Resp: no accessory muscle use, non-labored,  Skin: no rashes, no areas of induration  Neuro: normal tone, normal sensation to touch Psych:  normal insight, alert and oriented MSK:  Neck:  No TTP of the cervical midline spine.  Normal left shoulder ROM in active  Normal strength to resistance in IR and ER Normal ER  Normal Empty can test of left shoulder  Normal strength to resistance of shrug  Normal grip strength  Normal strength to resistance with pronation and supination.  Neurovascularly intact.      ASSESSMENT & PLAN:   Cervical radiculopathy Consistent with cervical radiculopathy. Rotator cuff is strong on exam  - prednisone  - counseled on HEP  - if no improvement may need to consider gabapentin or mobic if pain after course of prednisone.

## 2017-06-09 NOTE — Assessment & Plan Note (Signed)
Consistent with cervical radiculopathy. Rotator cuff is strong on exam  - prednisone  - counseled on HEP  - if no improvement may need to consider gabapentin or mobic if pain after course of prednisone.

## 2017-06-09 NOTE — Patient Instructions (Signed)
Please try the prednisone and stretches  Please let me know if you don't have improvement of your symptoms

## 2017-06-20 DIAGNOSIS — H01024 Squamous blepharitis left upper eyelid: Secondary | ICD-10-CM | POA: Diagnosis not present

## 2017-06-20 DIAGNOSIS — H01021 Squamous blepharitis right upper eyelid: Secondary | ICD-10-CM | POA: Diagnosis not present

## 2017-06-20 DIAGNOSIS — H01025 Squamous blepharitis left lower eyelid: Secondary | ICD-10-CM | POA: Diagnosis not present

## 2017-06-20 DIAGNOSIS — H01022 Squamous blepharitis right lower eyelid: Secondary | ICD-10-CM | POA: Diagnosis not present

## 2017-06-20 DIAGNOSIS — H2513 Age-related nuclear cataract, bilateral: Secondary | ICD-10-CM | POA: Diagnosis not present

## 2017-06-20 DIAGNOSIS — E119 Type 2 diabetes mellitus without complications: Secondary | ICD-10-CM | POA: Diagnosis not present

## 2017-06-20 LAB — HM DIABETES EYE EXAM

## 2017-10-20 ENCOUNTER — Encounter: Payer: Self-pay | Admitting: Gastroenterology

## 2017-11-07 DIAGNOSIS — N5201 Erectile dysfunction due to arterial insufficiency: Secondary | ICD-10-CM | POA: Diagnosis not present

## 2017-11-07 DIAGNOSIS — N401 Enlarged prostate with lower urinary tract symptoms: Secondary | ICD-10-CM | POA: Diagnosis not present

## 2017-11-07 DIAGNOSIS — N3941 Urge incontinence: Secondary | ICD-10-CM | POA: Diagnosis not present

## 2017-11-16 DIAGNOSIS — G894 Chronic pain syndrome: Secondary | ICD-10-CM | POA: Diagnosis not present

## 2017-11-16 DIAGNOSIS — M545 Low back pain: Secondary | ICD-10-CM | POA: Diagnosis not present

## 2017-11-20 ENCOUNTER — Other Ambulatory Visit: Payer: Self-pay | Admitting: Internal Medicine

## 2017-11-23 ENCOUNTER — Ambulatory Visit: Payer: Medicare Other | Admitting: Internal Medicine

## 2017-11-24 ENCOUNTER — Encounter: Payer: Self-pay | Admitting: Internal Medicine

## 2017-11-24 ENCOUNTER — Other Ambulatory Visit (INDEPENDENT_AMBULATORY_CARE_PROVIDER_SITE_OTHER): Payer: Medicare Other

## 2017-11-24 ENCOUNTER — Ambulatory Visit (INDEPENDENT_AMBULATORY_CARE_PROVIDER_SITE_OTHER): Payer: Medicare Other | Admitting: Internal Medicine

## 2017-11-24 VITALS — BP 126/82 | HR 84 | Temp 98.2°F | Ht 69.0 in | Wt 199.0 lb

## 2017-11-24 DIAGNOSIS — N320 Bladder-neck obstruction: Secondary | ICD-10-CM | POA: Diagnosis not present

## 2017-11-24 DIAGNOSIS — M79622 Pain in left upper arm: Secondary | ICD-10-CM | POA: Diagnosis not present

## 2017-11-24 DIAGNOSIS — E1165 Type 2 diabetes mellitus with hyperglycemia: Secondary | ICD-10-CM | POA: Diagnosis not present

## 2017-11-24 DIAGNOSIS — E785 Hyperlipidemia, unspecified: Secondary | ICD-10-CM | POA: Diagnosis not present

## 2017-11-24 DIAGNOSIS — Z0000 Encounter for general adult medical examination without abnormal findings: Secondary | ICD-10-CM | POA: Diagnosis not present

## 2017-11-24 DIAGNOSIS — Z23 Encounter for immunization: Secondary | ICD-10-CM

## 2017-11-24 DIAGNOSIS — I10 Essential (primary) hypertension: Secondary | ICD-10-CM | POA: Diagnosis not present

## 2017-11-24 LAB — BASIC METABOLIC PANEL
BUN: 20 mg/dL (ref 6–23)
CHLORIDE: 103 meq/L (ref 96–112)
CO2: 28 mEq/L (ref 19–32)
CREATININE: 0.94 mg/dL (ref 0.40–1.50)
Calcium: 9.6 mg/dL (ref 8.4–10.5)
GFR: 87.11 mL/min (ref >60.00)
GLUCOSE: 116 mg/dL — AB (ref 70–99)
POTASSIUM: 3.7 meq/L (ref 3.5–5.1)
Sodium: 140 mEq/L (ref 135–145)

## 2017-11-24 LAB — TSH: TSH: 0.47 u[IU]/mL (ref 0.35–4.50)

## 2017-11-24 LAB — LIPID PANEL
CHOLESTEROL: 122 mg/dL (ref 0–200)
HDL: 31.1 mg/dL — ABNORMAL LOW (ref >39.00)
NonHDL: 90.61
TRIGLYCERIDES: 332 mg/dL — AB (ref 0.0–149.0)
Total CHOL/HDL Ratio: 4
VLDL: 66.4 mg/dL — ABNORMAL HIGH (ref 0.0–40.0)

## 2017-11-24 LAB — URINALYSIS, ROUTINE W REFLEX MICROSCOPIC
Bilirubin Urine: NEGATIVE
HGB URINE DIPSTICK: NEGATIVE
KETONES UR: NEGATIVE
Leukocytes, UA: NEGATIVE
NITRITE: NEGATIVE
SPECIFIC GRAVITY, URINE: 1.025 (ref 1.000–1.030)
Total Protein, Urine: NEGATIVE
UROBILINOGEN UA: 0.2 (ref 0.0–1.0)
pH: 6 (ref 5.0–8.0)

## 2017-11-24 LAB — MICROALBUMIN / CREATININE URINE RATIO
CREATININE, U: 148.5 mg/dL
MICROALB UR: 2.8 mg/dL — AB (ref 0.0–1.9)
Microalb Creat Ratio: 1.9 mg/g (ref 0.0–30.0)

## 2017-11-24 LAB — CBC WITH DIFFERENTIAL/PLATELET
BASOS ABS: 0.1 10*3/uL (ref 0.0–0.1)
Basophils Relative: 0.7 % (ref 0.0–3.0)
EOS ABS: 0.6 10*3/uL (ref 0.0–0.7)
Eosinophils Relative: 4.8 % (ref 0.0–5.0)
HCT: 48.3 % (ref 39.0–52.0)
HEMOGLOBIN: 16.2 g/dL (ref 13.0–17.0)
LYMPHS PCT: 26.1 % (ref 12.0–46.0)
Lymphs Abs: 3.3 10*3/uL (ref 0.7–4.0)
MCHC: 33.6 g/dL (ref 30.0–36.0)
MCV: 95.2 fl (ref 78.0–100.0)
MONO ABS: 0.7 10*3/uL (ref 0.1–1.0)
Monocytes Relative: 5.7 % (ref 3.0–12.0)
Neutro Abs: 7.9 10*3/uL — ABNORMAL HIGH (ref 1.4–7.7)
Neutrophils Relative %: 62.7 % (ref 43.0–77.0)
Platelets: 278 10*3/uL (ref 150.0–400.0)
RBC: 5.07 Mil/uL (ref 4.22–5.81)
RDW: 13.2 % (ref 11.5–15.5)
WBC: 12.6 10*3/uL — AB (ref 4.0–10.5)

## 2017-11-24 LAB — LDL CHOLESTEROL, DIRECT: Direct LDL: 69 mg/dL

## 2017-11-24 LAB — HEPATIC FUNCTION PANEL
ALBUMIN: 4.5 g/dL (ref 3.5–5.2)
ALT: 18 U/L (ref 0–53)
AST: 10 U/L (ref 0–37)
Alkaline Phosphatase: 49 U/L (ref 39–117)
Bilirubin, Direct: 0.2 mg/dL (ref 0.0–0.3)
TOTAL PROTEIN: 6.6 g/dL (ref 6.0–8.3)
Total Bilirubin: 0.8 mg/dL (ref 0.2–1.2)

## 2017-11-24 LAB — HEMOGLOBIN A1C: HEMOGLOBIN A1C: 7.3 % — AB (ref 4.6–6.5)

## 2017-11-24 LAB — PSA: PSA: 1.84 ng/mL (ref 0.10–4.00)

## 2017-11-24 NOTE — Assessment & Plan Note (Signed)
stable overall by history and exam, recent data reviewed with pt, and pt to continue medical treatment as before,  to f/u any worsening symptoms or concerns  

## 2017-11-24 NOTE — Patient Instructions (Signed)
You had the flu shot today  Please continue all other medications as before, and refills have been done if requested.  Please have the pharmacy call with any other refills you may need.  Please continue your efforts at being more active, low cholesterol diet, and weight control.  Please keep your appointments with your specialists as you may have planned  Please call if you need referral to neurosurgury for the left elbow and arm pain  Please go to the LAB in the Basement (turn left off the elevator) for the tests to be done today  You will be contacted by phone if any changes need to be made immediately.  Otherwise, you will receive a letter about your results with an explanation, but please check with MyChart first.  Please remember to sign up for MyChart if you have not done so, as this will be important to you in the future with finding out test results, communicating by private email, and scheduling acute appointments online when needed.  Please return in 6 months, or sooner if needed

## 2017-11-24 NOTE — Assessment & Plan Note (Signed)
?   Neuritic vs other - consider NCS and/or MRI c spine with NS referral

## 2017-11-24 NOTE — Progress Notes (Signed)
Subjective:    Patient ID: Ethan Ellis, male    DOB: [birth year], 57 y.o.   MRN: [id number]  HPI  Here to f/u; overall doing ok,  Pt denies chest pain, increasing sob or doe, wheezing, orthopnea, PND, increased LE swelling, palpitations, dizziness or syncope.  Pt denies new neurological symptoms such as new headache, or facial or extremity weakness or numbness.  Pt denies polydipsia, polyuria, or low sugar episode.  Pt states overall good compliance with meds, mostly trying to follow appropriate diet, with wt overall stable, and has ongoing left arm and elbow pain of unclear etiology but no LUE weakness Past Medical History:  Diagnosis Date  . Depression   . Diabetes mellitus without complication ([place])    type 2  . History of kidney stones    passed stone - no surgery  . Hyperlipidemia 03/10/2014  . Hypertension   . LBP (low back pain) 2009   Right  . Unspecified viral hepatitis C without hepatic coma 04/04/2015   Past Surgical History:  Procedure Laterality Date  . APPENDECTOMY    . BACK SURGERY     lower back   . SHOULDER SURGERY     right - x 3 surgery    reports that he has been smoking cigarettes. He has a 20.00 pack-year smoking history. He has never used smokeless tobacco. He reports that he has current or past drug history. Drug: Marijuana. He reports that he does not drink alcohol. family history includes Diabetes in his father. Allergies  Allergen Reactions  . Naproxen     REACTION: mouth swelling. He can take advil and some other NSAIDs w/o problems   Current Outpatient Medications on File Prior to Visit  Medication Sig Dispense Refill  . albuterol (PROVENTIL HFA;VENTOLIN HFA) 108 (90 Base) MCG/ACT inhaler Inhale 2 puffs into the lungs every 6 (six) hours as needed for wheezing or shortness of breath. 1 Inhaler 11  . aspirin EC 81 MG tablet Take 1 tablet (81 mg total) by mouth daily. 90 tablet 11  . atorvastatin (LIPITOR) 10 MG tablet TAKE 1 TABLET BY MOUTH ONCE  DAILY 90 tablet 0  . Blood Glucose Monitoring Suppl (ONE TOUCH ULTRA SYSTEM KIT) W/DEVICE KIT Use as directed to check blood sugar.  Diagnosis code E11.9 1 each 0  . clotrimazole-betamethasone (LOTRISONE) cream Use as directed to affected area twice daily as needed 15 g 1  . cyclobenzaprine (FLEXERIL) 5 MG tablet Take 1 tablet (5 mg total) by mouth 3 (three) times daily as needed for muscle spasms. 30 tablet 1  . empagliflozin (JARDIANCE) 25 MG TABS tablet Take 25 mg by mouth daily. 90 tablet 3  . glipiZIDE (GLUCOTROL XL) 10 MG 24 hr tablet Take 1 tablet (10 mg total) by mouth daily with breakfast. 90 tablet 3  . glucose blood (ONE TOUCH TEST STRIPS) test strip Use as instructed once daily to check blood sugar.  Diagnosis code E11.9 100 each 12  . hydrochlorothiazide (HYDRODIURIL) 25 MG tablet Take 1 tablet (25 mg total) by mouth daily. 90 tablet 3  . Lancets MISC Use as directed once daily to check blood sugar.  Diagnosis code E11.9 100 each 11  . losartan (COZAAR) 100 MG tablet Take 1 tablet (100 mg total) by mouth daily. 90 tablet 3  . metFORMIN (GLUCOPHAGE-XR) 500 MG 24 hr tablet TAKE 3 TABLETS BY MOUTH ONCE DAILY WITH  BREAKFAST 270 tablet 0  . nortriptyline (PAMELOR) 50 MG capsule Take 1 capsule (50 mg  total) by mouth 3 (three) times daily. 90 capsule 5  . predniSONE (DELTASONE) 5 MG tablet Take 6 pills for first day, 5 pills second day, 4 pills third day, 3 pills fourth day, 2 pills the fifth day, and 1 pill sixth day. 21 tablet 0  . tiZANidine (ZANAFLEX) 4 MG tablet Take 4 mg by mouth 3 (three) times daily.     No current facility-administered medications on file prior to visit.    Review of Systems  Constitutional: Negative for other unusual diaphoresis or sweats HENT: Negative for ear discharge or swelling Eyes: Negative for other worsening visual disturbances Respiratory: Negative for stridor or other swelling  Gastrointestinal: Negative for worsening distension or other  blood Genitourinary: Negative for retention or other urinary change Musculoskeletal: Negative for other MSK pain or swelling Skin: Negative for color change or other new lesions Neurological: Negative for worsening tremors and other numbness  Psychiatric/Behavioral: Negative for worsening agitation or other fatigue All other system neg per pt    Objective:   Physical Exam BP 126/82   Pulse 84   Temp 98.2 F (36.8 C) (Oral)   Ht _0  (1.753 m)   Wt 199 lb (90.3 kg)   SpO2 98%   BMI 29.39 kg/m  VS noted,  Constitutional: Pt appears in NAD HENT: Head: NCAT.  Right Ear: External ear normal.  Left Ear: External ear normal.  Eyes: . Pupils are equal, round, and reactive to light. Conjunctivae and EOM are normal Nose: without d/c or deformity Neck: Neck supple. Gross normal ROM Cardiovascular: Normal rate and regular rhythm.   Pulmonary/Chest: Effort normal and breath sounds without rales or wheezing.  Left shoulder with mild point tenderness bicipital insertion site, no significant other arm or elbow tenderness or swelling Neurological: Pt is alert. At baseline orientation, motor grossly intact Skin: Skin is warm. No rashes, other new lesions, no LE edema Psychiatric: Pt behavior is normal without agitation  No other exam findings  Lab Results  Component Value Date   WBC 11.6 (H) 11/23/2016   HGB 15.6 11/23/2016   HCT 45.3 11/23/2016   PLT 244.0 11/23/2016   GLUCOSE 125 (H) 05/23/2017   CHOL 157 05/23/2017   TRIG 395.0 (H) 05/23/2017   HDL 41.00 05/23/2017   LDLDIRECT 75.0 05/23/2017   ALT 21 05/23/2017   AST 14 05/23/2017   NA 140 05/23/2017   K 4.2 05/23/2017   CL 103 05/23/2017   CREATININE 0.89 05/23/2017   BUN 17 05/23/2017   CO2 30 05/23/2017   TSH 0.69 11/23/2016   PSA 1.09 11/23/2016   HGBA1C 8.0 (H) 05/23/2017   MICROALBUR 1.7 11/23/2016       Assessment & Plan:

## 2017-12-12 ENCOUNTER — Other Ambulatory Visit: Payer: Self-pay | Admitting: Internal Medicine

## 2018-02-10 ENCOUNTER — Other Ambulatory Visit: Payer: Self-pay | Admitting: Internal Medicine

## 2018-03-09 ENCOUNTER — Other Ambulatory Visit: Payer: Self-pay | Admitting: Internal Medicine

## 2018-03-13 ENCOUNTER — Other Ambulatory Visit: Payer: Self-pay | Admitting: Internal Medicine

## 2018-05-24 DIAGNOSIS — M545 Low back pain: Secondary | ICD-10-CM | POA: Diagnosis not present

## 2018-05-24 DIAGNOSIS — G894 Chronic pain syndrome: Secondary | ICD-10-CM | POA: Diagnosis not present

## 2018-05-24 DIAGNOSIS — M7551 Bursitis of right shoulder: Secondary | ICD-10-CM | POA: Diagnosis not present

## 2018-05-25 ENCOUNTER — Other Ambulatory Visit: Payer: Self-pay

## 2018-05-25 ENCOUNTER — Other Ambulatory Visit (INDEPENDENT_AMBULATORY_CARE_PROVIDER_SITE_OTHER): Payer: Medicare Other

## 2018-05-25 ENCOUNTER — Encounter: Payer: Self-pay | Admitting: Internal Medicine

## 2018-05-25 ENCOUNTER — Ambulatory Visit (INDEPENDENT_AMBULATORY_CARE_PROVIDER_SITE_OTHER): Payer: Medicare Other | Admitting: Internal Medicine

## 2018-05-25 VITALS — BP 118/76 | HR 105 | Temp 98.3°F | Ht 69.0 in | Wt 197.0 lb

## 2018-05-25 DIAGNOSIS — J309 Allergic rhinitis, unspecified: Secondary | ICD-10-CM | POA: Diagnosis not present

## 2018-05-25 DIAGNOSIS — E785 Hyperlipidemia, unspecified: Secondary | ICD-10-CM

## 2018-05-25 DIAGNOSIS — L03317 Cellulitis of buttock: Secondary | ICD-10-CM | POA: Insufficient documentation

## 2018-05-25 DIAGNOSIS — I10 Essential (primary) hypertension: Secondary | ICD-10-CM

## 2018-05-25 DIAGNOSIS — E1165 Type 2 diabetes mellitus with hyperglycemia: Secondary | ICD-10-CM

## 2018-05-25 LAB — CBC WITH DIFFERENTIAL/PLATELET
Basophils Absolute: 0.1 10*3/uL (ref 0.0–0.1)
Basophils Relative: 0.6 % (ref 0.0–3.0)
EOS ABS: 0.1 10*3/uL (ref 0.0–0.7)
Eosinophils Relative: 0.6 % (ref 0.0–5.0)
HEMATOCRIT: 51.2 % (ref 39.0–52.0)
Hemoglobin: 17.2 g/dL — ABNORMAL HIGH (ref 13.0–17.0)
LYMPHS PCT: 14 % (ref 12.0–46.0)
Lymphs Abs: 2.4 10*3/uL (ref 0.7–4.0)
MCHC: 33.5 g/dL (ref 30.0–36.0)
MCV: 98.8 fl (ref 78.0–100.0)
Monocytes Absolute: 0.9 10*3/uL (ref 0.1–1.0)
Monocytes Relative: 5.3 % (ref 3.0–12.0)
NEUTROS PCT: 79.5 % — AB (ref 43.0–77.0)
Neutro Abs: 13.7 10*3/uL — ABNORMAL HIGH (ref 1.4–7.7)
PLATELETS: 315 10*3/uL (ref 150.0–400.0)
RBC: 5.19 Mil/uL (ref 4.22–5.81)
RDW: 14 % (ref 11.5–15.5)
WBC: 17.3 10*3/uL — ABNORMAL HIGH (ref 4.0–10.5)

## 2018-05-25 LAB — BASIC METABOLIC PANEL
BUN: 23 mg/dL (ref 6–23)
CO2: 27 mEq/L (ref 19–32)
Calcium: 9.9 mg/dL (ref 8.4–10.5)
Chloride: 104 mEq/L (ref 96–112)
Creatinine, Ser: 0.89 mg/dL (ref 0.40–1.50)
GFR: 87.15 mL/min (ref >60.00)
Glucose, Bld: 105 mg/dL — ABNORMAL HIGH (ref 70–99)
Potassium: 4.1 mEq/L (ref 3.5–5.1)
Sodium: 141 mEq/L (ref 135–145)

## 2018-05-25 LAB — HEPATIC FUNCTION PANEL
ALT: 17 U/L (ref 0–53)
AST: 11 U/L (ref 0–37)
Albumin: 4.8 g/dL (ref 3.5–5.2)
Alkaline Phosphatase: 53 U/L (ref 39–117)
BILIRUBIN DIRECT: 0.1 mg/dL (ref 0.0–0.3)
TOTAL PROTEIN: 7.2 g/dL (ref 6.0–8.3)
Total Bilirubin: 0.8 mg/dL (ref 0.2–1.2)

## 2018-05-25 LAB — HEMOGLOBIN A1C: Hgb A1c MFr Bld: 6.8 % — ABNORMAL HIGH (ref 4.6–6.5)

## 2018-05-25 LAB — LIPID PANEL
Cholesterol: 136 mg/dL (ref 0–200)
HDL: 37.7 mg/dL — ABNORMAL LOW (ref >39.00)
NonHDL: 98.04
Total CHOL/HDL Ratio: 4
Triglycerides: 363 mg/dL — ABNORMAL HIGH (ref 0.0–149.0)
VLDL: 72.6 mg/dL — ABNORMAL HIGH (ref 0.0–40.0)

## 2018-05-25 LAB — LDL CHOLESTEROL, DIRECT: LDL DIRECT: 70 mg/dL

## 2018-05-25 MED ORDER — DOXYCYCLINE HYCLATE 100 MG PO TABS: 100.0000 mg | ORAL_TABLET | Freq: Two times a day (BID) | ORAL | 0 refills | Status: DC

## 2018-05-25 NOTE — Assessment & Plan Note (Signed)
stable overall by history and exam, recent data reviewed with pt, and pt to continue medical treatment as before,  to f/u any worsening symptoms or concerns  

## 2018-05-25 NOTE — Patient Instructions (Signed)
Please take all new medication as prescribed - the antibiotic  Please continue all other medications as before, and refills have been done if requested.  Please have the pharmacy call with any other refills you may need.  Please continue your efforts at being more active, low cholesterol diet, and weight control.  Please keep your appointments with your specialists as you may have planned  Please go to the LAB in the Basement (turn left off the elevator) for the tests to be done today  You will be contacted by phone if any changes need to be made immediately.  Otherwise, you will receive a letter about your results with an explanation, but please check with MyChart first.  Please remember to sign up for MyChart if you have not done so, as this will be important to you in the future with finding out test results, communicating by private email, and scheduling acute appointments online when needed.  Please return in 6 months, or sooner if needed

## 2018-05-25 NOTE — Assessment & Plan Note (Signed)
Mild to mod, for antibx course,  to f/u any worsening symptoms or concerns 

## 2018-05-25 NOTE — Progress Notes (Signed)
Subjective:    Patient ID: Ethan Ellis, male    DOB: [birth year], 58 y.o.   MRN: [id number]  HPI  Here to f/u; overall doing ok,  Pt denies chest pain, increasing sob or doe, wheezing, orthopnea, PND, increased LE swelling, palpitations, dizziness or syncope.  Pt denies new neurological symptoms such as new headache, or facial or extremity weakness or numbness.  Pt denies polydipsia, polyuria, or low sugar episode.  Pt states overall good compliance with meds, mostly trying to follow appropriate diet, with wt overall stable,  but little exercise however.  Fall 2 wks ago, s/p 3 surguries to right shoulder, now with more pain after fall, s/p cortisone to the right [place] yesterday per Spine and Scoliosis.  Also with 3 days onset left buttock area of red, tender swelling without drainage or fever.  Does have several wks ongoing nasal allergy symptoms with clearish congestion, itch and sneezing, without fever, pain, ST, cough, swelling or wheezing, overall mild, prefers OTC allegra. Past Medical History:  Diagnosis Date  . Depression   . Diabetes mellitus without complication ([place])    type 2  . History of kidney stones    passed stone - no surgery  . Hyperlipidemia 03/10/2014  . Hypertension   . LBP (low back pain) 2009   Right  . Unspecified viral hepatitis C without hepatic coma 04/04/2015   Past Surgical History:  Procedure Laterality Date  . APPENDECTOMY    . BACK SURGERY     lower back   . SHOULDER SURGERY     right - x 3 surgery    reports that he has been smoking cigarettes. He has a 20.00 pack-year smoking history. He has never used smokeless tobacco. He reports current drug use. Drug: Marijuana. He reports that he does not drink alcohol. family history includes Diabetes in his father. Allergies  Allergen Reactions  . Naproxen     REACTION: mouth swelling. He can take advil and some other NSAIDs w/o problems   Current Outpatient Medications on File Prior to Visit   Medication Sig Dispense Refill  . albuterol (PROVENTIL HFA;VENTOLIN HFA) 108 (90 Base) MCG/ACT inhaler Inhale 2 puffs into the lungs every 6 (six) hours as needed for wheezing or shortness of breath. 1 Inhaler 11  . aspirin EC 81 MG tablet Take 1 tablet (81 mg total) by mouth daily. 90 tablet 11  . atorvastatin (LIPITOR) 10 MG tablet TAKE 1 TABLET BY MOUTH ONCE DAILY 90 tablet 1  . Blood Glucose Monitoring Suppl (ONE TOUCH ULTRA SYSTEM KIT) W/DEVICE KIT Use as directed to check blood sugar.  Diagnosis code E11.9 1 each 0  . clotrimazole-betamethasone (LOTRISONE) cream Use as directed to affected area twice daily as needed 15 g 1  . cyclobenzaprine (FLEXERIL) 5 MG tablet Take 1 tablet (5 mg total) by mouth 3 (three) times daily as needed for muscle spasms. 30 tablet 1  . empagliflozin (JARDIANCE) 25 MG TABS tablet Take 25 mg by mouth daily. 90 tablet 3  . glipiZIDE (GLUCOTROL XL) 10 MG 24 hr tablet TAKE 1 TABLET BY MOUTH ONCE DAILY WITH  BREAKFAST 90 tablet 1  . glucose blood (ONE TOUCH TEST STRIPS) test strip Use as instructed once daily to check blood sugar.  Diagnosis code E11.9 100 each 12  . hydrochlorothiazide (HYDRODIURIL) 25 MG tablet TAKE 1 TABLET BY MOUTH ONCE DAILY 90 tablet 0  . Lancets MISC Use as directed once daily to check blood sugar.  Diagnosis code E11.9 100  each 11  . losartan (COZAAR) 100 MG tablet TAKE 1 TABLET BY MOUTH ONCE DAILY 90 tablet 1  . metFORMIN (GLUCOPHAGE-XR) 500 MG 24 hr tablet TAKE 3 TABLETS BY MOUTH ONCE DAILY WITH BREAKFAST 270 tablet 1  . nortriptyline (PAMELOR) 50 MG capsule Take 1 capsule (50 mg total) by mouth 3 (three) times daily. 90 capsule 5  . predniSONE (DELTASONE) 5 MG tablet Take 6 pills for first day, 5 pills second day, 4 pills third day, 3 pills fourth day, 2 pills the fifth day, and 1 pill sixth day. 21 tablet 0  . tiZANidine (ZANAFLEX) 4 MG tablet Take 4 mg by mouth 3 (three) times daily.     No current facility-administered medications on file  prior to visit.    Review of Systems  Constitutional: Negative for other unusual diaphoresis or sweats HENT: Negative for ear discharge or swelling Eyes: Negative for other worsening visual disturbances Respiratory: Negative for stridor or other swelling  Gastrointestinal: Negative for worsening distension or other blood Genitourinary: Negative for retention or other urinary change Musculoskeletal: Negative for other MSK pain or swelling Skin: Negative for color change or other new lesions Neurological: Negative for worsening tremors and other numbness  Psychiatric/Behavioral: Negative for worsening agitation or other fatigue All other system neg per pt    Objective:   Physical Exam BP 118/76   Pulse (!) 105   Temp 98.3 F (36.8 C) (Oral)   Ht _0  (1.753 m)   Wt 197 lb (89.4 kg)   SpO2 93%   BMI 29.09 kg/m  VS noted, mild ill Constitutional: Pt appears in NAD HENT: Head: NCAT.  Right Ear: External ear normal.  Left Ear: External ear normal.  Bilat tm's with mild erythema.  Max sinus areas non tender.  Pharynx with mild erythema, no exudate Eyes: . Pupils are equal, round, and reactive to light. Conjunctivae and EOM are normal Nose: without d/c or deformity Neck: Neck supple. Gross normal ROM Cardiovascular: Normal rate and regular rhythm.   Pulmonary/Chest: Effort normal and breath sounds without rales or wheezing.  Abd:  Soft, NT, ND, + BS, no organomegaly Left buttock with 2 cm area red tender swelling without fluctuance or drainage over left ischial tuberosity Neurological: Pt is alert. At baseline orientation, motor grossly intact Skin: Skin is warm. No rashes, other new lesions, no LE edema Psychiatric: Pt behavior is normal without agitation  No other exam findings Lab Results  Component Value Date   WBC 17.3 (H) 05/25/2018   HGB 17.2 (H) 05/25/2018   HCT 51.2 05/25/2018   PLT 315.0 05/25/2018   GLUCOSE 105 (H) 05/25/2018   CHOL 136 05/25/2018   TRIG 363.0  (H) 05/25/2018   HDL 37.70 (L) 05/25/2018   LDLDIRECT 70.0 05/25/2018   ALT 17 05/25/2018   AST 11 05/25/2018   NA 141 05/25/2018   K 4.1 05/25/2018   CL 104 05/25/2018   CREATININE 0.89 05/25/2018   BUN 23 05/25/2018   CO2 27 05/25/2018   TSH 0.47 11/24/2017   PSA 1.84 11/24/2017   HGBA1C 6.8 (H) 05/25/2018   MICROALBUR 2.8 (H) 11/24/2017       Assessment & Plan:

## 2018-05-25 NOTE — Assessment & Plan Note (Signed)
Mild, for allegra otc prn,  to f/u any worsening symptoms or concerns  

## 2018-05-26 ENCOUNTER — Other Ambulatory Visit: Payer: Self-pay | Admitting: Internal Medicine

## 2018-05-26 ENCOUNTER — Encounter: Payer: Self-pay | Admitting: Internal Medicine

## 2018-05-26 DIAGNOSIS — D72829 Elevated white blood cell count, unspecified: Secondary | ICD-10-CM

## 2018-05-29 ENCOUNTER — Telehealth: Payer: Self-pay

## 2018-05-29 NOTE — Telephone Encounter (Signed)
-----   Message from Biagio Borg, MD sent at 05/26/2018 10:38 AM EDT ----- .Letter sent, cont same tx  The test results show that your current treatment is OK, except the white blood cells, which has been mild elevated for several years, now seems worse for now apparent reason.  We have to consider a possible blood disorder.  I will refer you to Hematology Oncology for further consideration.  Shirron to please inform pt, I will do referral

## 2018-05-29 NOTE — Telephone Encounter (Signed)
Pt has been informed of results and expressed understanding.  °

## 2018-06-12 ENCOUNTER — Telehealth: Payer: Self-pay | Admitting: Internal Medicine

## 2018-06-12 ENCOUNTER — Other Ambulatory Visit: Payer: Self-pay | Admitting: Internal Medicine

## 2018-06-12 NOTE — Telephone Encounter (Signed)
A new hem appt has been scheduled for Ethan Ellis to see Dr. Walden Field on 4/7 at 2pm. Pt agreed to the appt date and time.

## 2018-06-14 DIAGNOSIS — M25511 Pain in right shoulder: Secondary | ICD-10-CM | POA: Diagnosis not present

## 2018-06-14 DIAGNOSIS — M19011 Primary osteoarthritis, right shoulder: Secondary | ICD-10-CM | POA: Diagnosis not present

## 2018-06-14 DIAGNOSIS — M7551 Bursitis of right shoulder: Secondary | ICD-10-CM | POA: Diagnosis not present

## 2018-06-16 ENCOUNTER — Telehealth: Payer: Self-pay | Admitting: Internal Medicine

## 2018-06-16 NOTE — Telephone Encounter (Signed)
lft the pt a vm to reschedule appt with Dr. Walden Field

## 2018-06-16 NOTE — Telephone Encounter (Signed)
Pt returned my call to reschedule hem appt w/Dr. Walden Field. A new appt has been schedule for the pt to see Dr. Walden Field on 4/17 at 1pm.

## 2018-06-20 ENCOUNTER — Encounter: Payer: Medicare Other | Admitting: Internal Medicine

## 2018-06-30 ENCOUNTER — Other Ambulatory Visit: Payer: Self-pay

## 2018-06-30 ENCOUNTER — Inpatient Hospital Stay: Payer: Medicare Other | Attending: Internal Medicine | Admitting: Internal Medicine

## 2018-06-30 ENCOUNTER — Inpatient Hospital Stay: Payer: Medicare Other

## 2018-06-30 VITALS — BP 130/84 | HR 101 | Temp 98.0°F | Resp 18 | Ht 69.0 in | Wt 192.9 lb

## 2018-06-30 DIAGNOSIS — I10 Essential (primary) hypertension: Secondary | ICD-10-CM | POA: Diagnosis not present

## 2018-06-30 DIAGNOSIS — D72828 Other elevated white blood cell count: Secondary | ICD-10-CM

## 2018-06-30 DIAGNOSIS — F1290 Cannabis use, unspecified, uncomplicated: Secondary | ICD-10-CM

## 2018-06-30 DIAGNOSIS — D72829 Elevated white blood cell count, unspecified: Secondary | ICD-10-CM | POA: Insufficient documentation

## 2018-06-30 DIAGNOSIS — F17210 Nicotine dependence, cigarettes, uncomplicated: Secondary | ICD-10-CM | POA: Diagnosis not present

## 2018-06-30 DIAGNOSIS — B1920 Unspecified viral hepatitis C without hepatic coma: Secondary | ICD-10-CM | POA: Insufficient documentation

## 2018-06-30 DIAGNOSIS — Z79899 Other long term (current) drug therapy: Secondary | ICD-10-CM | POA: Insufficient documentation

## 2018-06-30 DIAGNOSIS — L03317 Cellulitis of buttock: Secondary | ICD-10-CM | POA: Diagnosis not present

## 2018-06-30 DIAGNOSIS — E119 Type 2 diabetes mellitus without complications: Secondary | ICD-10-CM | POA: Diagnosis not present

## 2018-06-30 LAB — CBC WITH DIFFERENTIAL (CANCER CENTER ONLY)
Abs Immature Granulocytes: 0.07 10*3/uL (ref 0.00–0.07)
Basophils Absolute: 0.1 10*3/uL (ref 0.0–0.1)
Basophils Relative: 0 %
Eosinophils Absolute: 0.3 10*3/uL (ref 0.0–0.5)
Eosinophils Relative: 2 %
HCT: 50.3 % (ref 39.0–52.0)
Hemoglobin: 17 g/dL (ref 13.0–17.0)
Immature Granulocytes: 1 %
Lymphocytes Relative: 24 %
Lymphs Abs: 3.4 10*3/uL (ref 0.7–4.0)
MCH: 33.1 pg (ref 26.0–34.0)
MCHC: 33.8 g/dL (ref 30.0–36.0)
MCV: 98.1 fL (ref 80.0–100.0)
Monocytes Absolute: 0.8 10*3/uL (ref 0.1–1.0)
Monocytes Relative: 5 %
Neutro Abs: 9.6 10*3/uL — ABNORMAL HIGH (ref 1.7–7.7)
Neutrophils Relative %: 68 %
Platelet Count: 248 10*3/uL (ref 150–400)
RBC: 5.13 MIL/uL (ref 4.22–5.81)
RDW: 12.6 % (ref 11.5–15.5)
WBC Count: 14.2 10*3/uL — ABNORMAL HIGH (ref 4.0–10.5)
nRBC: 0 % (ref 0.0–0.2)

## 2018-06-30 LAB — CMP (CANCER CENTER ONLY)
ALT: 14 U/L (ref 0–44)
AST: 11 U/L — ABNORMAL LOW (ref 15–41)
Albumin: 4.2 g/dL (ref 3.5–5.0)
Alkaline Phosphatase: 58 U/L (ref 38–126)
Anion gap: 13 (ref 5–15)
BUN: 17 mg/dL (ref 6–20)
CO2: 22 mmol/L (ref 22–32)
Calcium: 9 mg/dL (ref 8.9–10.3)
Chloride: 105 mmol/L (ref 98–111)
Creatinine: 0.84 mg/dL (ref 0.61–1.24)
GFR, Est AFR Am: >60 mL/min (ref >60)
GFR, Estimated: >60 mL/min (ref >60)
Glucose, Bld: 97 mg/dL (ref 70–99)
Potassium: 3.6 mmol/L (ref 3.5–5.1)
Sodium: 140 mmol/L (ref 135–145)
Total Bilirubin: 0.8 mg/dL (ref 0.3–1.2)
Total Protein: 6.9 g/dL (ref 6.5–8.1)

## 2018-06-30 LAB — SEDIMENTATION RATE: Sed Rate: 0 mm/hr (ref 0–16)

## 2018-06-30 LAB — LACTATE DEHYDROGENASE: LDH: 135 U/L (ref 98–192)

## 2018-06-30 NOTE — Progress Notes (Signed)
Referring physician:  Biagio Borg, MD  Diagnosis Other elevated white blood cell (WBC) count - Plan: CBC with Differential ([place] Only), CMP ([place] only), Lactate dehydrogenase (LDH), Sedimentation rate, Ambulatory referral to General Surgery  Staging Cancer Staging No matching staging information was found for the patient.  Assessment and Plan:  1.  Leucocytosis.  58 year old male referred for evaluation due to elevated WBC.  Labs done 05/25/2018 showed WBC 17.3 HB 17.2 plts 315,000.  Chemistries WNL with K+ 4.1 Cr 0.89 normal LFTs.  Labs done 11/24/2017 reviewed and showed WBC 12.6 HB 16.2 plts 278,000.  Pt denies fevers, chills, night sweats and has noted no adenopathy.  He reports a lesion on right buttock area that Has been present for 6 months.  He has been treated with abx by Dr. Jenny Reichmann with no improvement in symptoms.  He has also taken prednisone in the past.  Pt is a current smoker, smoking 1-1.5 ppd for 40 years.  He denies any family history of leukemias or lymphomas.  Pt seen today for consultation due to leucocytosis.    I discussed with pt that he has several reasons for the elevated WBC which include smoking, steroids and infection.  Pt has an area of cellulitis on left buttock region that he reports has been present for 6 months and has not improved despite antibiotics prescribed by Dr. Jenny Reichmann.  I will refer the pt to general surgery for evaluation as he is also a diabetic.   Labs done today 06/30/2018 reviewed and showed WBC 14.2 HB 17 plts 248,000.  WBC improved from recent labs.   I discussed with him once infection has improved I suspect his WBC count will improve.    He will have phone follow-up in 2 weeks to go over labs.  Further work-up pending surgical evaluation.    2.  Left buttock cellulitis.  Pt has an area of cellulitis on buttock region that he reports has been present for 6 months and has not improved despite antibiotics prescribed by Dr. Jenny Reichmann.  I will  refer the pt to general surgery for evaluation as he is also a diabetic.  I discussed with him once infection has improved I suspect his WBC count will improve.    3.  Hypertension.  BP is 130/84.  Follow-up with Dr. Jenny Reichmann for management.    4.  DM.  Follow-up with Dr. Jenny Reichmann for monitoring.    5. Smoking.  Cessation recommended.  Will refer for lung cancer screening on RTC.    6.  Hepatitis C.  Pt had Quant Hep C level done in 2017 that was undetectable.  Follow-up with GI as recommended.  Recent HIV testing was negative in 09/2016.    7.  Health maintenance.  Pt had colonoscopy done 12/06/2014 that showed polyps, diverticulosis and hemorrhoids.  Pathology returned as tubular adenoma.  No malignancy or dysplasia noted.    40 minutes spent with more than 50% spent in review of records, counseling and coordination of care.    HPI:  58 year old male referred for evaluation due to elevated WBC.  Labs done 05/25/2018 showed WBC 17.3 HB 17.2 plts 315,000.  Chemistries WNL with K+ 4.1 Cr 0.89 normal LFTs.   Labs done 11/24/2017 reviewed and showed WBC 12.6 HB 16.2 plts 278,000.  Pt denies fevers, chills, night sweats and has noted no adenopathy.  He reports a lesion on right buttock area that Has been present for 6 months.  He  has been treated with abx by Dr. Jenny Reichmann with no improvement in symptoms.  Pt is a current smoker, smoking 1-1.5 ppd for 40 years.  He denies any family history of leukemias or lymphomas.  Pt seen today for consultation due to leucocytosis.    Problem List Patient Active Problem List   Diagnosis Date Noted  . Cellulitis of buttock, left [L03.317] 05/25/2018  . Left upper arm pain [M79.622] 11/24/2017  . Cervical radiculopathy [M54.12] 06/09/2017  . Left lateral epicondylitis [M77.12] 05/23/2017  . Lesion of penis [N48.9] 09/14/2016  . Rash [R21] 09/14/2016  . Asthma with acute exacerbation [J45.901] 01/27/2016  . Allergic rhinitis [J30.9] 01/27/2016  . Dyspnea [R06.00] 01/02/2016   . Unspecified viral hepatitis C without hepatic coma [B19.20] 04/04/2015  . Hyperlipidemia [E78.5] 03/10/2014  . Uncontrolled hypertension [I10] 02/22/2014  . Uncontrolled diabetes mellitus ([place]) [E11.65] 02/22/2014  . Tobacco dependence [F17.200] 12/30/2010  . Fatigue [R53.83] 12/30/2010  . Encounter for long-term (current) use of other medications [Z79.899] 12/25/2010  . Paresthesia [R20.2] 12/08/2010  . FREQUENCY, URINARY [R35.0] 01/29/2009  . Neoplasm of uncertain behavior of skin [D48.5] 04/24/2008  . Cough [R05] 12/04/2007  . LOW BACK PAIN [M54.5] 06/28/2007  . PAIN IN SOFT TISSUES OF LIMB [M79.609] 06/19/2007    Past Medical History Past Medical History:  Diagnosis Date  . Depression   . Diabetes mellitus without complication ([place])    type 2  . History of kidney stones    passed stone - no surgery  . Hyperlipidemia 03/10/2014  . Hypertension   . LBP (low back pain) 2009   Right  . Unspecified viral hepatitis C without hepatic coma 04/04/2015    Past Surgical History Past Surgical History:  Procedure Laterality Date  . APPENDECTOMY    . BACK SURGERY     lower back   . SHOULDER SURGERY     right - x 3 surgery    Family History Family History  Problem Relation Age of Onset  . Diabetes Father   . Colon cancer Neg Hx   . Colon polyps Neg Hx   . Rectal cancer Neg Hx   . Stomach cancer Neg Hx      Social History  reports that he has been smoking cigarettes. He has a 20.00 pack-year smoking history. He has never used smokeless tobacco. He reports current drug use. Drug: Marijuana. He reports that he does not drink alcohol.  Medications  Current Outpatient Medications:  .  albuterol (PROVENTIL HFA;VENTOLIN HFA) 108 (90 Base) MCG/ACT inhaler, Inhale 2 puffs into the lungs every 6 (six) hours as needed for wheezing or shortness of breath., Disp: 1 Inhaler, Rfl: 11 .  aspirin EC 81 MG tablet, Take 1 tablet (81 mg total) by mouth daily., Disp: 90 tablet, Rfl: 11 .   atorvastatin (LIPITOR) 10 MG tablet, TAKE 1 TABLET BY MOUTH ONCE DAILY, Disp: 90 tablet, Rfl: 1 .  Blood Glucose Monitoring Suppl (ONE TOUCH ULTRA SYSTEM KIT) W/DEVICE KIT, Use as directed to check blood sugar.  Diagnosis code E11.9, Disp: 1 each, Rfl: 0 .  clotrimazole-betamethasone (LOTRISONE) cream, Use as directed to affected area twice daily as needed, Disp: 15 g, Rfl: 1 .  cyclobenzaprine (FLEXERIL) 5 MG tablet, Take 1 tablet (5 mg total) by mouth 3 (three) times daily as needed for muscle spasms., Disp: 30 tablet, Rfl: 1 .  glipiZIDE (GLUCOTROL XL) 10 MG 24 hr tablet, Take 1 tablet by mouth once daily with breakfast, Disp: 90 tablet, Rfl: 1 .  glucose blood (ONE TOUCH TEST STRIPS) test strip, Use as instructed once daily to check blood sugar.  Diagnosis code E11.9, Disp: 100 each, Rfl: 12 .  hydrochlorothiazide (HYDRODIURIL) 25 MG tablet, Take 1 tablet by mouth once daily, Disp: 90 tablet, Rfl: 1 .  JARDIANCE 25 MG TABS tablet, Take 1 tablet by mouth once daily, Disp: 90 tablet, Rfl: 1 .  Lancets MISC, Use as directed once daily to check blood sugar.  Diagnosis code E11.9, Disp: 100 each, Rfl: 11 .  losartan (COZAAR) 100 MG tablet, TAKE 1 TABLET BY MOUTH ONCE DAILY, Disp: 90 tablet, Rfl: 1 .  metFORMIN (GLUCOPHAGE-XR) 500 MG 24 hr tablet, TAKE 3 TABLETS BY MOUTH ONCE DAILY WITH BREAKFAST, Disp: 270 tablet, Rfl: 1 .  nortriptyline (PAMELOR) 50 MG capsule, Take 1 capsule (50 mg total) by mouth 3 (three) times daily., Disp: 90 capsule, Rfl: 5 .  tiZANidine (ZANAFLEX) 4 MG tablet, Take 4 mg by mouth 3 (three) times daily., Disp: , Rfl:   Allergies Naproxen  Review of Systems Review of Systems - Oncology ROS negative   Physical Exam  Vitals Wt Readings from Last 3 Encounters:  06/30/18 192 lb 14.4 oz (87.5 kg)  05/25/18 197 lb (89.4 kg)  11/24/17 199 lb (90.3 kg)   Temp Readings from Last 3 Encounters:  06/30/18 98 F (36.7 C) (Oral)  05/25/18 98.3 F (36.8 C) (Oral)  11/24/17  98.2 F (36.8 C) (Oral)   BP Readings from Last 3 Encounters:  06/30/18 130/84  05/25/18 118/76  11/24/17 126/82   Pulse Readings from Last 3 Encounters:  06/30/18 (!) 101  05/25/18 (!) 105  11/24/17 84   Constitutional: Well-developed, well-nourished, and in no distress.   HENT: Head: Normocephalic and atraumatic.  Mouth/Throat: No oropharyngeal exudate. Mucosa moist. Eyes: Pupils are equal, round, and reactive to light. Conjunctivae are normal. No scleral icterus.  Neck: Normal range of motion. Neck supple. No JVD present.  Cardiovascular: Normal rate, regular rhythm and normal heart sounds.  Exam reveals no gallop and no friction rub.   No murmur heard. Pulmonary/Chest: Effort normal and breath sounds normal. No respiratory distress. No wheezes.No rales.  Abdominal: Soft. Bowel sounds are normal. No distension. There is no tenderness. There is no guarding.  Musculoskeletal: No edema or tenderness.  Lymphadenopathy: No cervical,axillary or supraclavicular adenopathy.  Neurological: Alert and oriented to person, place, and time. No cranial nerve deficit.  Skin: Skin is warm and dry. No rash noted. No erythema. No pallor.  Nurse chaperone present.  Area of cellulitis noted on left buttock region.  Mild erythema noted.   Psychiatric: Affect and judgment normal.   Labs No visits with results within 3 Day(s) from this visit.  Latest known visit with results is:  Appointment on 05/25/2018  Component Date Value Ref Range Status  . WBC 05/25/2018 17.3* 4.0 - 10.5 K/uL Final  . RBC 05/25/2018 5.19  4.22 - 5.81 Mil/uL Final  . Hemoglobin 05/25/2018 17.2* 13.0 - 17.0 g/dL Final  . HCT 05/25/2018 51.2  39.0 - 52.0 % Final  . MCV 05/25/2018 98.8  78.0 - 100.0 fl Final  . MCHC 05/25/2018 33.5  30.0 - 36.0 g/dL Final  . RDW 05/25/2018 14.0  11.5 - 15.5 % Final  . Platelets 05/25/2018 315.0  150.0 - 400.0 K/uL Final  . Neutrophils Relative % 05/25/2018 79.5* 43.0 - 77.0 % Final  .  Lymphocytes Relative 05/25/2018 14.0  12.0 - 46.0 % Final  . Monocytes Relative 05/25/2018 5.3  3.0 - 12.0 % Final  . Eosinophils Relative 05/25/2018 0.6  0.0 - 5.0 % Final  . Basophils Relative 05/25/2018 0.6  0.0 - 3.0 % Final  . Neutro Abs 05/25/2018 13.7* 1.4 - 7.7 K/uL Final  . Lymphs Abs 05/25/2018 2.4  0.7 - 4.0 K/uL Final  . Monocytes Absolute 05/25/2018 0.9  0.1 - 1.0 K/uL Final  . Eosinophils Absolute 05/25/2018 0.1  0.0 - 0.7 K/uL Final  . Basophils Absolute 05/25/2018 0.1  0.0 - 0.1 K/uL Final  . Total Bilirubin 05/25/2018 0.8  0.2 - 1.2 mg/dL Final  . Bilirubin, Direct 05/25/2018 0.1  0.0 - 0.3 mg/dL Final  . Alkaline Phosphatase 05/25/2018 53  39 - 117 U/L Final  . AST 05/25/2018 11  0 - 37 U/L Final  . ALT 05/25/2018 17  0 - 53 U/L Final  . Total Protein 05/25/2018 7.2  6.0 - 8.3 g/dL Final  . Albumin 05/25/2018 4.8  3.5 - 5.2 g/dL Final  . Sodium 05/25/2018 141  135 - 145 mEq/L Final  . Potassium 05/25/2018 4.1  3.5 - 5.1 mEq/L Final  . Chloride 05/25/2018 104  96 - 112 mEq/L Final  . CO2 05/25/2018 27  19 - 32 mEq/L Final  . Glucose, Bld 05/25/2018 105* 70 - 99 mg/dL Final  . BUN 05/25/2018 23  6 - 23 mg/dL Final  . Creatinine, Ser 05/25/2018 0.89  0.40 - 1.50 mg/dL Final  . Calcium 05/25/2018 9.9  8.4 - 10.5 mg/dL Final  . GFR 05/25/2018 87.15  >60.00 mL/min Final  . Cholesterol 05/25/2018 136  0 - 200 mg/dL Final   ATP III Classification       Desirable:  < 200 mg/dL               Borderline High:  200 - 239 mg/dL          High:  > = 240 mg/dL  . Triglycerides 05/25/2018 363.0* 0.0 - 149.0 mg/dL Final   Normal:  <150 mg/dLBorderline High:  150 - 199 mg/dL  . HDL 05/25/2018 37.70* >39.00 mg/dL Final  . VLDL 05/25/2018 72.6* 0.0 - 40.0 mg/dL Final  . Total CHOL/HDL Ratio 05/25/2018 4   Final                  Men          Women1/2 Average Risk     3.4          3.3Average Risk          5.0          4.42X Average Risk          9.6          7.13X Average Risk           15.0          11.0                      . NonHDL 05/25/2018 98.04   Final   NOTE:  Non-HDL goal should be 30 mg/dL higher than patient's LDL goal (i.e. LDL goal of < 70 mg/dL, would have non-HDL goal of < 100 mg/dL)  . Hgb A1c MFr Bld 05/25/2018 6.8* 4.6 - 6.5 % Final   Glycemic Control Guidelines for People with Diabetes:Non Diabetic:  <6%Goal of Therapy: <7%Additional Action Suggested:  >8%   . Direct LDL 05/25/2018 70.0  mg/dL Final   Optimal:  <100 mg/dLNear or Above Optimal:  100-129 mg/dLBorderline High:  130-159 mg/dLHigh:  160-189 mg/dLVery High:  >190 mg/dL     Pathology Orders Placed This Encounter  Procedures  . CBC with Differential ([facility] Only)    Standing Status:   Future    Standing Expiration Date:   06/30/2019  . CMP ([place] only)    Standing Status:   Future    Standing Expiration Date:   06/30/2019  . Lactate dehydrogenase (LDH)    Standing Status:   Future    Standing Expiration Date:   06/30/2019  . Sedimentation rate    Standing Status:   Future    Standing Expiration Date:   06/30/2019  . Ambulatory referral to General Surgery    Referral Priority:   Routine    Referral Type:   Surgical    Referral Reason:   Specialty Services Required    Requested Specialty:   General Surgery    Number of Visits Requested:   1       Zoila Shutter MD

## 2018-07-03 ENCOUNTER — Telehealth: Payer: Self-pay | Admitting: Internal Medicine

## 2018-07-03 NOTE — Telephone Encounter (Signed)
Called regarding 5/1 °

## 2018-07-14 ENCOUNTER — Inpatient Hospital Stay: Payer: Medicare Other | Attending: Internal Medicine | Admitting: Internal Medicine

## 2018-07-14 DIAGNOSIS — F17200 Nicotine dependence, unspecified, uncomplicated: Secondary | ICD-10-CM

## 2018-07-14 DIAGNOSIS — D72828 Other elevated white blood cell count: Secondary | ICD-10-CM | POA: Diagnosis not present

## 2018-07-14 NOTE — Progress Notes (Signed)
Virtual Visit via Telephone Note  I connected with Ethan Ellis on 07/14/18 at  2:00 PM EDT by telephone and verified that I am speaking with the correct person using two identifiers.   I discussed the limitations, risks, security and privacy concerns of performing an evaluation and management service by telephone and the availability of in person appointments. I also discussed with the patient that there may be a patient responsible charge related to this service. The patient expressed understanding and agreed to proceed.  Interval History:  Historical data obtained from note dated 06/30/2018.  58 year old male referred for evaluation due to elevated WBC.  Labs done 05/25/2018 showed WBC 17.3 HB 17.2 plts 315,000.  Chemistries WNL with K+ 4.1 Cr 0.89 normal LFTs.  Labs done 11/24/2017 reviewed and showed WBC 12.6 HB 16.2 plts 278,000.  Pt denies fevers, chills, night sweats and has noted no adenopathy.  He reports a lesion on right buttock area that has been present for 6 months.  He has been treated with abx by Dr. Jenny Reichmann with no improvement in symptoms.  Pt is a current smoker, smoking 1-1.5 ppd for 40 years.  He denies any family history of leukemias or lymphomas.   Observations/Objective: Review of labs from 06/30/2018.    Assessment and Plan:   1.  Leucocytosis.  58 year old male referred for evaluation due to elevated WBC.  Labs done 05/25/2018 showed WBC 17.3 HB 17.2 plts 315,000.  Chemistries WNL with K+ 4.1 Cr 0.89 normal LFTs.  Labs done 11/24/2017 reviewed and showed WBC 12.6 HB 16.2 plts 278,000.  Pt denies fevers, chills, night sweats and has noted no adenopathy.  He reports a lesion on right buttock area that has been present for 6 months.  He has been treated with abx by Dr. Jenny Reichmann with no improvement in symptoms.  He has also taken prednisone in the past.  Pt is a current smoker, smoking 1-1.5 ppd for 40 years.  He denies any family history of leukemias or lymphomas.    Previously, I discussed  with pt that he has several reasons for the elevated WBC which include smoking, steroids and infection.  Pt has an area of cellulitis on left buttock region that he reports has been present for 6 months and has not improved despite antibiotics prescribed by Dr. Jenny Reichmann.  I will refer the pt to general surgery for evaluation as he is also a diabetic.   Labs done  06/30/2018 reviewed and showed WBC 14.2 HB 17 plts 248,000.  Chemistries WNL with K+ 3.6 Cr 0.84 normal LFT and LDH.  WBC improved from recent labs.   I discussed with him once infection has improved I suspect his WBC count will improve.    Pt will have repeat labs in 09/2018 and will follow-up at that time to go over results. Further work-up pending surgical evaluation.    2.  Left buttock cellulitis.  Pt has an area of cellulitis on buttock region that he reports has been present for 6 months and has not improved despite antibiotics prescribed by Dr. Jenny Reichmann.  I will refer the pt to general surgery for evaluation as he is also a diabetic.  I discussed with him once infection has improved I suspect his WBC count will improve.    3.  Hypertension.  BP was 130/84.  Follow-up with Dr. Jenny Reichmann for management.    4.  DM.  Follow-up with Dr. Jenny Reichmann for monitoring.    5. Smoking.  Pt has 20 pack year history of smoking.  Cessation recommended.  Will refer for lung cancer screening clinic.    6.  Hepatitis C.  Pt had Quant Hep C level done in 2017 that was undetectable.  Follow-up with GI as recommended.  Recent HIV testing was negative in 09/2016.    7.  Health maintenance.  Pt had colonoscopy done 12/06/2014 that showed polyps, diverticulosis and hemorrhoids.  Pathology returned as tubular adenoma.  No malignancy or dysplasia noted.    Follow Up Instructions: Follow-up in 09/2018 with labs.    I discussed the assessment and treatment plan with the patient. The patient was provided an opportunity to ask questions and all were answered. The patient agreed with  the plan and demonstrated an understanding of the instructions.   The patient was advised to call back or seek an in-person evaluation if the symptoms worsen or if the condition fails to improve as anticipated.  I provided 15 minutes of non-face-to-face time during this encounter.   Zoila Shutter, MD

## 2018-07-17 ENCOUNTER — Telehealth: Payer: Self-pay | Admitting: Internal Medicine

## 2018-07-17 NOTE — Telephone Encounter (Signed)
Tried to reach regarding schedule °

## 2018-08-04 ENCOUNTER — Telehealth: Payer: Self-pay

## 2018-08-04 DIAGNOSIS — Z122 Encounter for screening for malignant neoplasm of respiratory organs: Secondary | ICD-10-CM

## 2018-08-04 DIAGNOSIS — Z87891 Personal history of nicotine dependence: Secondary | ICD-10-CM

## 2018-08-04 DIAGNOSIS — F17210 Nicotine dependence, cigarettes, uncomplicated: Secondary | ICD-10-CM

## 2018-08-04 NOTE — Telephone Encounter (Signed)
I don't see any messages from [place], this may a lung cancer screening call.  Denise please call pt, I do see a referrla for the lung cancer screening program.

## 2018-08-08 NOTE — Telephone Encounter (Signed)
LMTC x 1  

## 2018-08-10 ENCOUNTER — Other Ambulatory Visit: Payer: Self-pay | Admitting: Surgery

## 2018-08-10 ENCOUNTER — Ambulatory Visit: Payer: Self-pay | Admitting: Surgery

## 2018-08-10 DIAGNOSIS — B359 Dermatophytosis, unspecified: Secondary | ICD-10-CM | POA: Diagnosis not present

## 2018-08-10 DIAGNOSIS — L280 Lichen simplex chronicus: Secondary | ICD-10-CM | POA: Diagnosis not present

## 2018-08-10 DIAGNOSIS — R590 Localized enlarged lymph nodes: Secondary | ICD-10-CM | POA: Diagnosis not present

## 2018-08-10 DIAGNOSIS — L989 Disorder of the skin and subcutaneous tissue, unspecified: Secondary | ICD-10-CM | POA: Diagnosis not present

## 2018-08-11 NOTE — Telephone Encounter (Signed)
Spoke to pt and scheduled SDMV 09/04/18 3:00 CT ordered Nothing further needed

## 2018-08-15 ENCOUNTER — Other Ambulatory Visit: Payer: Self-pay | Admitting: Surgery

## 2018-08-15 DIAGNOSIS — R590 Localized enlarged lymph nodes: Secondary | ICD-10-CM

## 2018-08-19 ENCOUNTER — Other Ambulatory Visit: Payer: Self-pay | Admitting: Internal Medicine

## 2018-08-21 ENCOUNTER — Telehealth: Payer: Self-pay | Admitting: Acute Care

## 2018-08-21 NOTE — Telephone Encounter (Signed)
  In May Denise made patient aware as stated below:  Spoke to pt and scheduled SDMV 09/04/18 3:00 CT ordered Nothing further needed  Visit with SG shared visit 2:30/Lung CA screening 3:30 on 08/2918.

## 2018-08-22 MED ORDER — METFORMIN HCL 500 MG PO TABS: ORAL_TABLET | ORAL | 3 refills | Status: DC

## 2018-08-22 NOTE — Telephone Encounter (Signed)
Done erx 

## 2018-08-22 NOTE — Addendum Note (Signed)
Addended by: Biagio Borg on: 08/22/2018 12:48 PM   Modules accepted: Orders

## 2018-08-22 NOTE — Telephone Encounter (Addendum)
Pharmacy stated they are out of metFORMIN (GLUCOPHAGE-XR) 500 MG 24 hr tablet pt normally receives due to a recall. Pt is out of medication and they would like to know if Dr. Jenny Reichmann is okay with switching to the immediate release until they get ER 500 back in stock. Please advise  [facility] [address], [place] - [place] [phone] (Phone) [phone] (Fax)

## 2018-08-28 DIAGNOSIS — H0102A Squamous blepharitis right eye, upper and lower eyelids: Secondary | ICD-10-CM | POA: Diagnosis not present

## 2018-08-28 DIAGNOSIS — H2513 Age-related nuclear cataract, bilateral: Secondary | ICD-10-CM | POA: Diagnosis not present

## 2018-08-28 DIAGNOSIS — H0102B Squamous blepharitis left eye, upper and lower eyelids: Secondary | ICD-10-CM | POA: Diagnosis not present

## 2018-08-28 DIAGNOSIS — E119 Type 2 diabetes mellitus without complications: Secondary | ICD-10-CM | POA: Diagnosis not present

## 2018-09-04 ENCOUNTER — Encounter: Payer: Medicare Other | Admitting: Acute Care

## 2018-09-04 ENCOUNTER — Ambulatory Visit: Payer: Medicare Other

## 2018-09-08 ENCOUNTER — Ambulatory Visit
Admission: RE | Admit: 2018-09-08 | Discharge: 2018-09-08 | Disposition: A | Discharge status: Home / Self Care | Payer: Medicare Other | Source: Ambulatory Visit | Attending: Surgery | Admitting: Surgery

## 2018-09-08 ENCOUNTER — Telehealth: Payer: Self-pay | Admitting: Acute Care

## 2018-09-08 DIAGNOSIS — K8020 Calculus of gallbladder without cholecystitis without obstruction: Secondary | ICD-10-CM | POA: Diagnosis not present

## 2018-09-08 DIAGNOSIS — R590 Localized enlarged lymph nodes: Secondary | ICD-10-CM

## 2018-09-08 DIAGNOSIS — K5730 Diverticulosis of large intestine without perforation or abscess without bleeding: Secondary | ICD-10-CM | POA: Diagnosis not present

## 2018-09-08 DIAGNOSIS — D72829 Elevated white blood cell count, unspecified: Secondary | ICD-10-CM | POA: Diagnosis not present

## 2018-09-08 MED ORDER — IOPAMIDOL (ISOVUE-300) INJECTION 61%
100.0000 mL | Freq: Once | INTRAVENOUS | Status: AC | PRN
  Administered 2018-09-08: 100 mL via INTRAVENOUS

## 2018-09-08 NOTE — Telephone Encounter (Signed)
He will not need the CT chest for lung cancer screening (too soon) , but can still have the appointment with Judson Roch. Thanks.

## 2018-09-08 NOTE — Telephone Encounter (Signed)
Left detailed message for patient. Will leave this encounter open in case he calls back.

## 2018-09-08 NOTE — Telephone Encounter (Signed)
Looking at pt's chart, pt is scheduled to have a CT Abdomen Pelvis with Contrast and CT with Contrast today. Pt is scheduled to have a CT Chest Lung Cancer Screen performed Monday, 6/29.  Pt is wanting to know if he still needs to have the CT performed Monday since he is having two different CTs performed today.   Pt has a shared decision visit Monday 6/29 at 2:30 followed by the scheduled CT at 3:30. Tonya, please advise if you believe that the CT which is scheduled Monday can be cancelled since pt is having CTs performed today or if pt still needs to have the CT performed Monday after the Shared Decision Visit. Thanks!

## 2018-09-11 ENCOUNTER — Encounter: Payer: Medicare Other | Admitting: Acute Care

## 2018-09-11 ENCOUNTER — Telehealth: Payer: Self-pay | Admitting: Acute Care

## 2018-09-11 ENCOUNTER — Inpatient Hospital Stay: Admission: RE | Admit: 2018-09-11 | Payer: Medicare Other | Source: Ambulatory Visit

## 2018-09-11 NOTE — Telephone Encounter (Signed)
Pt does have scheduled shared decision visit today 09/11/2018 at 2:30 with SG. Routing this encounter to her as an Juluis Rainier so she knows that pad two CTs performed 6/26.   The order for the lung cancer screen CT has not been cancelled yet which is currently scheduled to be performed today at 3:30.

## 2018-09-11 NOTE — Telephone Encounter (Signed)
Langley Gauss, This patient was a no show for his appointment for [facility]. Unfortunately Raquel Sarna got involved and confirmed that he did not need a CT until 08/2019, but did not instruct him to come for the [facility] as scheduled, or cancel the appointment and have [place] re-schedule . We can re-schedule closer to the follow up scan in 2021  , but please make sure this is managed by [place], and not Triage. Thanks

## 2018-09-11 NOTE — Telephone Encounter (Signed)
The patient will come for his SDMV today, but he does not need the CT chest scheduled for today. He will need his first LDCT through the program 08/2019. Please cancel the CT chest scheduled for today. Langley Gauss will schedule the scan for 08/2019. Thanks so much.

## 2018-09-11 NOTE — Telephone Encounter (Signed)
I have cancelled the order for the LDCT. Attempted to call pt but unable to reach. Per pt's DPR it is okay to leave a detailed message on his cell. Left pt detailed message letting him know that he did not need CT after shared decision visit due to having CTs 6/26 but we would see him at 2:30 for the visit with SG. Nothing further needed.

## 2018-09-11 NOTE — Progress Notes (Signed)
Please call the patient and let them know that their CT scan showed no sign of enlarged lymph nodes or intra-abdominal masses.  He does have gallstones, as well as diverticulosis.  No need for further follow-up, unless he wants to discuss these CT findings or if he develops gallbladder symptoms.

## 2018-09-12 NOTE — Telephone Encounter (Signed)
Pt is in my workque and will schedule him for lung cancer screening in 08/2019.

## 2018-09-13 ENCOUNTER — Other Ambulatory Visit: Payer: Self-pay | Admitting: Internal Medicine

## 2018-09-28 ENCOUNTER — Telehealth: Payer: Self-pay | Admitting: *Deleted

## 2018-09-28 NOTE — Telephone Encounter (Signed)
Received call from patient requesting to change his appt for labs and Dr. Walden Field to next Friday, 10/06/18 instead of 09/29/18.  Scheduling request sent. Appt for 09/29/18 cancelled per pt request.

## 2018-09-29 ENCOUNTER — Telehealth: Payer: Self-pay | Admitting: Internal Medicine

## 2018-09-29 ENCOUNTER — Other Ambulatory Visit: Payer: Medicare Other

## 2018-09-29 ENCOUNTER — Ambulatory Visit: Payer: Medicare Other | Admitting: Internal Medicine

## 2018-09-29 NOTE — Telephone Encounter (Signed)
R/s appt per 7/16 sch message - pt aware of new appt date and time

## 2018-10-06 ENCOUNTER — Inpatient Hospital Stay (HOSPITAL_BASED_OUTPATIENT_CLINIC_OR_DEPARTMENT_OTHER): Payer: Medicare Other | Admitting: Internal Medicine

## 2018-10-06 ENCOUNTER — Inpatient Hospital Stay: Payer: Medicare Other | Attending: Internal Medicine

## 2018-10-06 ENCOUNTER — Other Ambulatory Visit: Payer: Self-pay

## 2018-10-06 VITALS — BP 124/77 | HR 106 | Temp 99.8°F | Resp 18 | Ht 69.0 in | Wt 188.6 lb

## 2018-10-06 DIAGNOSIS — D72828 Other elevated white blood cell count: Secondary | ICD-10-CM | POA: Insufficient documentation

## 2018-10-06 DIAGNOSIS — D751 Secondary polycythemia: Secondary | ICD-10-CM | POA: Diagnosis not present

## 2018-10-06 DIAGNOSIS — F17210 Nicotine dependence, cigarettes, uncomplicated: Secondary | ICD-10-CM

## 2018-10-06 DIAGNOSIS — E119 Type 2 diabetes mellitus without complications: Secondary | ICD-10-CM

## 2018-10-06 DIAGNOSIS — I10 Essential (primary) hypertension: Secondary | ICD-10-CM | POA: Insufficient documentation

## 2018-10-06 DIAGNOSIS — Z79899 Other long term (current) drug therapy: Secondary | ICD-10-CM | POA: Diagnosis not present

## 2018-10-06 LAB — CMP (CANCER CENTER ONLY)
ALT: 11 U/L (ref 0–44)
AST: 11 U/L — ABNORMAL LOW (ref 15–41)
Albumin: 4.5 g/dL (ref 3.5–5.0)
Alkaline Phosphatase: 59 U/L (ref 38–126)
Anion gap: 11 (ref 5–15)
BUN: 17 mg/dL (ref 6–20)
CO2: 26 mmol/L (ref 22–32)
Calcium: 9.5 mg/dL (ref 8.9–10.3)
Chloride: 102 mmol/L (ref 98–111)
Creatinine: 1 mg/dL (ref 0.61–1.24)
GFR, Est AFR Am: >60 mL/min (ref >60)
GFR, Estimated: >60 mL/min (ref >60)
Glucose, Bld: 140 mg/dL — ABNORMAL HIGH (ref 70–99)
Potassium: 3.4 mmol/L — ABNORMAL LOW (ref 3.5–5.1)
Sodium: 139 mmol/L (ref 135–145)
Total Bilirubin: 1.1 mg/dL (ref 0.3–1.2)
Total Protein: 7.1 g/dL (ref 6.5–8.1)

## 2018-10-06 LAB — CBC WITH DIFFERENTIAL (CANCER CENTER ONLY)
Abs Immature Granulocytes: 0.05 10*3/uL (ref 0.00–0.07)
Basophils Absolute: 0.1 10*3/uL (ref 0.0–0.1)
Basophils Relative: 1 %
Eosinophils Absolute: 0.3 10*3/uL (ref 0.0–0.5)
Eosinophils Relative: 2 %
HCT: 51.9 % (ref 39.0–52.0)
Hemoglobin: 17.8 g/dL — ABNORMAL HIGH (ref 13.0–17.0)
Immature Granulocytes: 0 %
Lymphocytes Relative: 22 %
Lymphs Abs: 2.7 10*3/uL (ref 0.7–4.0)
MCH: 33.2 pg (ref 26.0–34.0)
MCHC: 34.3 g/dL (ref 30.0–36.0)
MCV: 96.8 fL (ref 80.0–100.0)
Monocytes Absolute: 0.8 10*3/uL (ref 0.1–1.0)
Monocytes Relative: 6 %
Neutro Abs: 8.5 10*3/uL — ABNORMAL HIGH (ref 1.7–7.7)
Neutrophils Relative %: 69 %
Platelet Count: 260 10*3/uL (ref 150–400)
RBC: 5.36 MIL/uL (ref 4.22–5.81)
RDW: 11.9 % (ref 11.5–15.5)
WBC Count: 12.3 10*3/uL — ABNORMAL HIGH (ref 4.0–10.5)
nRBC: 0 % (ref 0.0–0.2)

## 2018-10-06 LAB — LACTATE DEHYDROGENASE: LDH: 131 U/L (ref 98–192)

## 2018-10-06 NOTE — Progress Notes (Signed)
Diagnosis No diagnosis found.  Staging Cancer Staging No matching staging information was found for the patient.  Assessment and Plan:    1.  Leucocytosis.  58 year old male referred for evaluation due to elevated WBC.  Labs done 05/25/2018 showed WBC 17.3 HB 17.2 plts 315,000.  Chemistries WNL with K+ 4.1 Cr 0.89 normal LFTs.  Labs done 11/24/2017 reviewed and showed WBC 12.6 HB 16.2 plts 278,000.  Pt denies fevers, chills, night sweats and has noted no adenopathy.  He reports a lesion on right buttock area that has been present for 6 months.  He has been treated with abx by Dr. Jenny Reichmann with no improvement in symptoms.  He has also taken prednisone in the past.  Pt is a current smoker, smoking 1-1.5 ppd for 40 years.  He denies any family history of leukemias or lymphomas.    Previously, I discussed with pt that he has several reasons for the elevated WBC which include smoking, steroids and infection.  Pt has an area of cellulitis on left buttock region that he reports has been present for 6 months and has not improved despite antibiotics prescribed by Dr. Jenny Reichmann.  I will refer the pt to general surgery for evaluation as he is also a diabetic.   Labs done  06/30/2018 showed WBC 14.2 HB 17 plts 248,000.  Chemistries WNL with K+ 3.6 Cr 0.84 normal LFT and LDH.  WBC improved from recent labs.   I discussed with him once infection has improved I suspect his WBC count will improve.    Labs done 10/06/2018 reviewed and showed WBC 12 HB 17.8 HCT 52 plts 260,000.  Chemistries showed K+ 4 Cr 1 normal LFTs.  Pt is referred back to PCP due to ongoing concerns about buttock cellulitis.  I discussed with him Counts have improved and are 12,000 today.  Pt assigned prn follow-up.  Pt should continue follow-up with PCP.    2.  Polycythemia.  Likely due to long smoking history.  HC 51.  Smoking cessation recommended.    3.  Left buttock cellulitis.  Pt has an area of cellulitis on buttock region that he reports has been  present for 6 months and has not improved despite antibiotics prescribed by Dr. Jenny Reichmann. Pt was referred to General surgery for evaluation.  Pt continues to report cellulitis.  We will contact Dr. Jenny Reichmann for follow-up with pt.    4.  Hypertension.  BP 124/77.   Follow-up with Dr. Jenny Reichmann for monitoring.    5.  DM.  Follow-up with Dr. Jenny Reichmann for monitoring.    6. Smoking.  Pt has 20 pack year history of smoking.  Cessation recommended.    7.  Hepatitis C.  Pt had Quant Hep C level done in 2017 that was undetectable.  Follow-up with GI as recommended.  Recent HIV testing was negative in 09/2016.    8.  Health maintenance.  Pt had colonoscopy done 12/06/2014 that showed polyps, diverticulosis and hemorrhoids.  Pathology returned as tubular adenoma.  No malignancy or dysplasia noted.    15 minutes spent with more than 50% spent in counseling and coordination of care.     Oncology History   No history exists.     Problem List Patient Active Problem List   Diagnosis Date Noted  . Cellulitis of buttock, left [L03.317] 05/25/2018  . Left upper arm pain [M79.622] 11/24/2017  . Cervical radiculopathy [M54.12] 06/09/2017  . Left lateral epicondylitis [M77.12] 05/23/2017  . Lesion of penis [  N48.9] 09/14/2016  . Rash [R21] 09/14/2016  . Asthma with acute exacerbation [J45.901] 01/27/2016  . Allergic rhinitis [J30.9] 01/27/2016  . Dyspnea [R06.00] 01/02/2016  . Unspecified viral hepatitis C without hepatic coma [B19.20] 04/04/2015  . Hyperlipidemia [E78.5] 03/10/2014  . Uncontrolled hypertension [I10] 02/22/2014  . Uncontrolled diabetes mellitus ([place]) [E11.65] 02/22/2014  . Tobacco dependence [F17.200] 12/30/2010  . Fatigue [R53.83] 12/30/2010  . Encounter for long-term (current) use of other medications [Z79.899] 12/25/2010  . Paresthesia [R20.2] 12/08/2010  . FREQUENCY, URINARY [R35.0] 01/29/2009  . Neoplasm of uncertain behavior of skin [D48.5] 04/24/2008  . Cough [R05] 12/04/2007  . LOW BACK  PAIN [M54.5] 06/28/2007  . PAIN IN SOFT TISSUES OF LIMB [M79.609] 06/19/2007    Past Medical History Past Medical History:  Diagnosis Date  . Depression   . Diabetes mellitus without complication ([place])    type 2  . History of kidney stones    passed stone - no surgery  . Hyperlipidemia 03/10/2014  . Hypertension   . LBP (low back pain) 2009   Right  . Unspecified viral hepatitis C without hepatic coma 04/04/2015    Past Surgical History Past Surgical History:  Procedure Laterality Date  . APPENDECTOMY    . BACK SURGERY     lower back   . SHOULDER SURGERY     right - x 3 surgery    Family History Family History  Problem Relation Age of Onset  . Diabetes Father   . Colon cancer Neg Hx   . Colon polyps Neg Hx   . Rectal cancer Neg Hx   . Stomach cancer Neg Hx      Social History  reports that he has been smoking cigarettes. He has a 20.00 pack-year smoking history. He has never used smokeless tobacco. He reports current drug use. Drug: Marijuana. He reports that he does not drink alcohol.  Medications  Current Outpatient Medications:  .  albuterol (PROVENTIL HFA;VENTOLIN HFA) 108 (90 Base) MCG/ACT inhaler, Inhale 2 puffs into the lungs every 6 (six) hours as needed for wheezing or shortness of breath., Disp: 1 Inhaler, Rfl: 11 .  aspirin EC 81 MG tablet, Take 1 tablet (81 mg total) by mouth daily., Disp: 90 tablet, Rfl: 11 .  atorvastatin (LIPITOR) 10 MG tablet, Take 1 tablet by mouth once daily, Disp: 90 tablet, Rfl: 0 .  Blood Glucose Monitoring Suppl (ONE TOUCH ULTRA SYSTEM KIT) W/DEVICE KIT, Use as directed to check blood sugar.  Diagnosis code E11.9, Disp: 1 each, Rfl: 0 .  clotrimazole-betamethasone (LOTRISONE) cream, Use as directed to affected area twice daily as needed, Disp: 15 g, Rfl: 1 .  cyclobenzaprine (FLEXERIL) 5 MG tablet, Take 1 tablet (5 mg total) by mouth 3 (three) times daily as needed for muscle spasms., Disp: 30 tablet, Rfl: 1 .  glipiZIDE  (GLUCOTROL XL) 10 MG 24 hr tablet, Take 1 tablet by mouth once daily with breakfast, Disp: 90 tablet, Rfl: 1 .  glucose blood (ONE TOUCH TEST STRIPS) test strip, Use as instructed once daily to check blood sugar.  Diagnosis code E11.9, Disp: 100 each, Rfl: 12 .  hydrochlorothiazide (HYDRODIURIL) 25 MG tablet, Take 1 tablet by mouth once daily, Disp: 90 tablet, Rfl: 1 .  JARDIANCE 25 MG TABS tablet, Take 1 tablet by mouth once daily, Disp: 90 tablet, Rfl: 1 .  Lancets MISC, Use as directed once daily to check blood sugar.  Diagnosis code E11.9, Disp: 100 each, Rfl: 11 .  losartan (COZAAR) 100  MG tablet, Take 1 tablet by mouth once daily, Disp: 90 tablet, Rfl: 0 .  metFORMIN (GLUCOPHAGE) 500 MG tablet, 2 tab by mouth in the AM, and 1 in the PM, Disp: 270 tablet, Rfl: 3 .  nortriptyline (PAMELOR) 50 MG capsule, Take 1 capsule (50 mg total) by mouth 3 (three) times daily., Disp: 90 capsule, Rfl: 5 .  tiZANidine (ZANAFLEX) 4 MG tablet, Take 4 mg by mouth 3 (three) times daily., Disp: , Rfl:   Allergies Naproxen  Review of Systems Review of Systems - Oncology ROS negative   Physical Exam  Vitals Wt Readings from Last 3 Encounters:  10/06/18 188 lb 9.6 oz (85.5 kg)  06/30/18 192 lb 14.4 oz (87.5 kg)  05/25/18 197 lb (89.4 kg)   Temp Readings from Last 3 Encounters:  10/06/18 99.8 F (37.7 C) (Oral)  06/30/18 98 F (36.7 C) (Oral)  05/25/18 98.3 F (36.8 C) (Oral)   BP Readings from Last 3 Encounters:  10/06/18 124/77  06/30/18 130/84  05/25/18 118/76   Pulse Readings from Last 3 Encounters:  10/06/18 (!) 106  06/30/18 (!) 101  05/25/18 (!) 105   Constitutional: Well-developed, well-nourished, and in no distress.   HENT: Head: Normocephalic and atraumatic.  Mouth/Throat: No oropharyngeal exudate. Mucosa moist. Eyes: Pupils are equal, round, and reactive to light. Conjunctivae are normal. No scleral icterus.  Neck: Normal range of motion. Neck supple. No JVD present.   Cardiovascular: Normal rate, regular rhythm and normal heart sounds.  Exam reveals no gallop and no friction rub.   No murmur heard. Pulmonary/Chest: Effort normal and breath sounds normal. No respiratory distress. No wheezes.No rales.  Abdominal: Soft. Bowel sounds are normal. No distension. There is no tenderness. There is no guarding.  Musculoskeletal: No edema or tenderness.  Lymphadenopathy: No cervical, axillary or supraclavicular adenopathy.  Neurological: Alert and oriented to person, place, and time. No cranial nerve deficit.  Skin: Skin is warm and dry. No rash noted. No erythema. No pallor.  Psychiatric: Affect and judgment normal.   Labs Appointment on 10/06/2018  Component Date Value Ref Range Status  . LDH 10/06/2018 131  98 - 192 U/L Final   Performed at [facility], [place] [address] [place], [place] [address]  . Sodium 10/06/2018 139  135 - 145 mmol/L Final  . Potassium 10/06/2018 3.4* 3.5 - 5.1 mmol/L Final  . Chloride 10/06/2018 102  98 - 111 mmol/L Final  . CO2 10/06/2018 26  22 - 32 mmol/L Final  . Glucose, Bld 10/06/2018 140* 70 - 99 mg/dL Final  . BUN 10/06/2018 17  6 - 20 mg/dL Final  . Creatinine 10/06/2018 1.00  0.61 - 1.24 mg/dL Final  . Calcium 10/06/2018 9.5  8.9 - 10.3 mg/dL Final  . Total Protein 10/06/2018 7.1  6.5 - 8.1 g/dL Final  . Albumin 10/06/2018 4.5  3.5 - 5.0 g/dL Final  . AST 10/06/2018 11* 15 - 41 U/L Final  . ALT 10/06/2018 11  0 - 44 U/L Final  . Alkaline Phosphatase 10/06/2018 59  38 - 126 U/L Final  . Total Bilirubin 10/06/2018 1.1  0.3 - 1.2 mg/dL Final  . GFR, Est Non Af Am 10/06/2018 >60  >60 mL/min Final  . GFR, Est AFR Am 10/06/2018 >60  >60 mL/min Final  . Anion gap 10/06/2018 11  5 - 15 Final   Performed at [facility], [place] [address], [place], [place] [address]  . WBC Count 10/06/2018  12.3* 4.0 - 10.5 K/uL Final  . RBC 10/06/2018 5.36  4.22 - 5.81 MIL/uL Final  . Hemoglobin  10/06/2018 17.8* 13.0 - 17.0 g/dL Final  . HCT 10/06/2018 51.9  39.0 - 52.0 % Final  . MCV 10/06/2018 96.8  80.0 - 100.0 fL Final  . MCH 10/06/2018 33.2  26.0 - 34.0 pg Final  . MCHC 10/06/2018 34.3  30.0 - 36.0 g/dL Final  . RDW 10/06/2018 11.9  11.5 - 15.5 % Final  . Platelet Count 10/06/2018 260  150 - 400 K/uL Final  . nRBC 10/06/2018 0.0  0.0 - 0.2 % Final  . Neutrophils Relative % 10/06/2018 69  % Final  . Neutro Abs 10/06/2018 8.5* 1.7 - 7.7 K/uL Final  . Lymphocytes Relative 10/06/2018 22  % Final  . Lymphs Abs 10/06/2018 2.7  0.7 - 4.0 K/uL Final  . Monocytes Relative 10/06/2018 6  % Final  . Monocytes Absolute 10/06/2018 0.8  0.1 - 1.0 K/uL Final  . Eosinophils Relative 10/06/2018 2  % Final  . Eosinophils Absolute 10/06/2018 0.3  0.0 - 0.5 K/uL Final  . Basophils Relative 10/06/2018 1  % Final  . Basophils Absolute 10/06/2018 0.1  0.0 - 0.1 K/uL Final  . Immature Granulocytes 10/06/2018 0  % Final  . Abs Immature Granulocytes 10/06/2018 0.05  0.00 - 0.07 K/uL Final   Performed at [facility], [place] [address], [place], [place] [address]     Pathology No orders of the defined types were placed in this encounter.      Zoila Shutter MD

## 2018-11-23 DIAGNOSIS — M7552 Bursitis of left shoulder: Secondary | ICD-10-CM | POA: Diagnosis not present

## 2018-11-23 DIAGNOSIS — M545 Low back pain: Secondary | ICD-10-CM | POA: Diagnosis not present

## 2018-11-23 DIAGNOSIS — G894 Chronic pain syndrome: Secondary | ICD-10-CM | POA: Diagnosis not present

## 2018-11-23 DIAGNOSIS — M7551 Bursitis of right shoulder: Secondary | ICD-10-CM | POA: Diagnosis not present

## 2018-11-24 ENCOUNTER — Ambulatory Visit: Payer: Medicare Other | Admitting: Internal Medicine

## 2018-11-27 ENCOUNTER — Telehealth: Payer: Self-pay | Admitting: Internal Medicine

## 2018-11-27 NOTE — Telephone Encounter (Signed)
Pt states that he is in the donut hole and his JARDIANCE 25 MG TABS tablet is over $150.  States that he can't afford and wants to know what Dr. Gwynn Burly recommendation is.

## 2018-11-27 NOTE — Telephone Encounter (Signed)
I see pt missed his OV last wk  Please ask pt to make rov

## 2018-11-27 NOTE — Telephone Encounter (Signed)
Noted  

## 2018-11-27 NOTE — Telephone Encounter (Signed)
Appointment has been scheduled for Friday.

## 2018-12-01 ENCOUNTER — Ambulatory Visit: Payer: Medicare Other | Admitting: Internal Medicine

## 2018-12-04 ENCOUNTER — Ambulatory Visit: Payer: Medicare Other | Admitting: Internal Medicine

## 2018-12-06 ENCOUNTER — Ambulatory Visit: Payer: Medicare Other | Admitting: Orthopaedic Surgery

## 2018-12-06 ENCOUNTER — Other Ambulatory Visit (INDEPENDENT_AMBULATORY_CARE_PROVIDER_SITE_OTHER): Payer: Medicare Other

## 2018-12-06 ENCOUNTER — Other Ambulatory Visit: Payer: Self-pay

## 2018-12-06 ENCOUNTER — Encounter: Payer: Self-pay | Admitting: Internal Medicine

## 2018-12-06 ENCOUNTER — Ambulatory Visit (INDEPENDENT_AMBULATORY_CARE_PROVIDER_SITE_OTHER): Payer: Medicare Other | Admitting: Internal Medicine

## 2018-12-06 VITALS — BP 124/88 | HR 94 | Temp 97.9°F | Ht 69.0 in | Wt 180.0 lb

## 2018-12-06 DIAGNOSIS — F17200 Nicotine dependence, unspecified, uncomplicated: Secondary | ICD-10-CM

## 2018-12-06 DIAGNOSIS — E559 Vitamin D deficiency, unspecified: Secondary | ICD-10-CM

## 2018-12-06 DIAGNOSIS — N320 Bladder-neck obstruction: Secondary | ICD-10-CM

## 2018-12-06 DIAGNOSIS — R634 Abnormal weight loss: Secondary | ICD-10-CM | POA: Diagnosis not present

## 2018-12-06 DIAGNOSIS — D369 Benign neoplasm, unspecified site: Secondary | ICD-10-CM | POA: Diagnosis not present

## 2018-12-06 DIAGNOSIS — F329 Major depressive disorder, single episode, unspecified: Secondary | ICD-10-CM | POA: Diagnosis not present

## 2018-12-06 DIAGNOSIS — F32A Depression, unspecified: Secondary | ICD-10-CM

## 2018-12-06 DIAGNOSIS — K4090 Unilateral inguinal hernia, without obstruction or gangrene, not specified as recurrent: Secondary | ICD-10-CM | POA: Diagnosis not present

## 2018-12-06 DIAGNOSIS — K8080 Other cholelithiasis without obstruction: Secondary | ICD-10-CM | POA: Diagnosis not present

## 2018-12-06 DIAGNOSIS — E1165 Type 2 diabetes mellitus with hyperglycemia: Secondary | ICD-10-CM | POA: Diagnosis not present

## 2018-12-06 DIAGNOSIS — R3915 Urgency of urination: Secondary | ICD-10-CM

## 2018-12-06 DIAGNOSIS — E538 Deficiency of other specified B group vitamins: Secondary | ICD-10-CM

## 2018-12-06 DIAGNOSIS — D509 Iron deficiency anemia, unspecified: Secondary | ICD-10-CM

## 2018-12-06 DIAGNOSIS — Z23 Encounter for immunization: Secondary | ICD-10-CM | POA: Diagnosis not present

## 2018-12-06 DIAGNOSIS — K8020 Calculus of gallbladder without cholecystitis without obstruction: Secondary | ICD-10-CM | POA: Insufficient documentation

## 2018-12-06 LAB — CBC WITH DIFFERENTIAL/PLATELET
Basophils Absolute: 0.1 10*3/uL (ref 0.0–0.1)
Basophils Relative: 0.7 % (ref 0.0–3.0)
Eosinophils Absolute: 0.6 10*3/uL (ref 0.0–0.7)
Eosinophils Relative: 4.8 % (ref 0.0–5.0)
HCT: 51.2 % (ref 39.0–52.0)
Hemoglobin: 16.9 g/dL (ref 13.0–17.0)
Lymphocytes Relative: 30.5 % (ref 12.0–46.0)
Lymphs Abs: 3.8 10*3/uL (ref 0.7–4.0)
MCHC: 33.1 g/dL (ref 30.0–36.0)
MCV: 98.4 fl (ref 78.0–100.0)
Monocytes Absolute: 0.8 10*3/uL (ref 0.1–1.0)
Monocytes Relative: 6.1 % (ref 3.0–12.0)
Neutro Abs: 7.2 10*3/uL (ref 1.4–7.7)
Neutrophils Relative %: 57.9 % (ref 43.0–77.0)
Platelets: 277 10*3/uL (ref 150.0–400.0)
RBC: 5.2 Mil/uL (ref 4.22–5.81)
RDW: 13.4 % (ref 11.5–15.5)
WBC: 12.4 10*3/uL — ABNORMAL HIGH (ref 4.0–10.5)

## 2018-12-06 LAB — HEPATIC FUNCTION PANEL
ALT: 12 U/L (ref 0–53)
AST: 11 U/L (ref 0–37)
Albumin: 4.6 g/dL (ref 3.5–5.2)
Alkaline Phosphatase: 57 U/L (ref 39–117)
Bilirubin, Direct: 0.1 mg/dL (ref 0.0–0.3)
Total Bilirubin: 0.5 mg/dL (ref 0.2–1.2)
Total Protein: 6.6 g/dL (ref 6.0–8.3)

## 2018-12-06 LAB — HEMOGLOBIN A1C: Hgb A1c MFr Bld: 6.9 % — ABNORMAL HIGH (ref 4.6–6.5)

## 2018-12-06 LAB — LIPID PANEL
Cholesterol: 138 mg/dL (ref 0–200)
HDL: 42.5 mg/dL (ref >39.00)
NonHDL: 95.06
Total CHOL/HDL Ratio: 3
Triglycerides: 215 mg/dL — ABNORMAL HIGH (ref 0.0–149.0)
VLDL: 43 mg/dL — ABNORMAL HIGH (ref 0.0–40.0)

## 2018-12-06 LAB — TSH: TSH: 0.95 u[IU]/mL (ref 0.35–4.50)

## 2018-12-06 LAB — BASIC METABOLIC PANEL
BUN: 18 mg/dL (ref 6–23)
CO2: 29 mEq/L (ref 19–32)
Calcium: 9.5 mg/dL (ref 8.4–10.5)
Chloride: 102 mEq/L (ref 96–112)
Creatinine, Ser: 0.84 mg/dL (ref 0.40–1.50)
GFR: 92.99 mL/min (ref >60.00)
Glucose, Bld: 109 mg/dL — ABNORMAL HIGH (ref 70–99)
Potassium: 4.3 mEq/L (ref 3.5–5.1)
Sodium: 141 mEq/L (ref 135–145)

## 2018-12-06 LAB — LDL CHOLESTEROL, DIRECT: Direct LDL: 76 mg/dL

## 2018-12-06 LAB — IBC PANEL
Iron: 79 ug/dL (ref 42–165)
Saturation Ratios: 18.6 % — ABNORMAL LOW (ref 20.0–50.0)
Transferrin: 304 mg/dL (ref 212.0–360.0)

## 2018-12-06 LAB — PSA: PSA: 1.98 ng/mL (ref 0.10–4.00)

## 2018-12-06 MED ORDER — CITALOPRAM HYDROBROMIDE 20 MG PO TABS: 20.0000 mg | ORAL_TABLET | Freq: Every day | ORAL | 3 refills | Status: DC

## 2018-12-06 NOTE — Assessment & Plan Note (Signed)
Mod to severe, for celexa 20 qd, delcines counseling referral

## 2018-12-06 NOTE — Assessment & Plan Note (Signed)
Urged to quit, pt not ready

## 2018-12-06 NOTE — Assessment & Plan Note (Signed)
Mild symptomatic, for general surgury referral

## 2018-12-06 NOTE — Assessment & Plan Note (Signed)
Due for f/u colnooscopy,  to f/u any worsening symptoms or concerns

## 2018-12-06 NOTE — Progress Notes (Signed)
Subjective:    Patient ID: Ethan Ellis, male    DOB: 24-Jun-1960, 58 y.o.   MRN: [id number]  HPI  Here to f/u; overall doing ok,  Pt denies chest pain, increasing sob or doe, wheezing, orthopnea, PND, increased LE swelling, palpitations, dizziness or syncope.  Pt denies new neurological symptoms such as new headache, or facial or extremity weakness or numbness.  Pt denies polydipsia, polyuria, or low sugar episode.  Pt states overall good compliance with meds, mostly trying to follow appropriate diet, with wt overall stable,  but little exercise however.   Pt denies fever, night sweats, loss of appetite, or other constitutional symptoms  Wt one yr ago was 218 per pt, not clear why.   Wt Readings from Last 3 Encounters:  12/06/18 180 lb (81.6 kg)  10/06/18 188 lb 9.6 oz (85.5 kg)  06/30/18 192 lb 14.4 oz (87.5 kg)  Plans to f/u with Dr Jeffie Pollock but c/o urinary urgency for several wks, Denies urinary symptoms such as dysuria, frequency, urgency, flank pain, hematuria or n/v, fever, chills.  Also, Denies worsening reflux, abd pain, dysphagia, n/v, bowel change or blood but has a left groin swelling new for about 2 month with some mild discomfort.  Denies have mild to mod 2 mo worsening depressive symptoms, without suicidal ideation, or panic.   Also can no longer afford the jardiance due to donut hole. Still smoking though, cant seem to quit Past Medical History:  Diagnosis Date  . Depression   . Diabetes mellitus without complication ([place])    type 2  . History of kidney stones    passed stone - no surgery  . Hyperlipidemia 03/10/2014  . Hypertension   . LBP (low back pain) 2009   Right  . Unspecified viral hepatitis C without hepatic coma 04/04/2015   Past Surgical History:  Procedure Laterality Date  . APPENDECTOMY    . BACK SURGERY     lower back   . SHOULDER SURGERY     right - x 3 surgery    reports that he has been smoking cigarettes. He has a 20.00 pack-year smoking history. He has  never used smokeless tobacco. He reports current drug use. Drug: Marijuana. He reports that he does not drink alcohol. family history includes Diabetes in his father. Allergies  Allergen Reactions  . Naproxen     REACTION: mouth swelling. He can take advil and some other NSAIDs w/o problems   Current Outpatient Medications on File Prior to Visit  Medication Sig Dispense Refill  . albuterol (PROVENTIL HFA;VENTOLIN HFA) 108 (90 Base) MCG/ACT inhaler Inhale 2 puffs into the lungs every 6 (six) hours as needed for wheezing or shortness of breath. 1 Inhaler 11  . aspirin EC 81 MG tablet Take 1 tablet (81 mg total) by mouth daily. 90 tablet 11  . atorvastatin (LIPITOR) 10 MG tablet Take 1 tablet by mouth once daily 90 tablet 0  . Blood Glucose Monitoring Suppl (ONE TOUCH ULTRA SYSTEM KIT) W/DEVICE KIT Use as directed to check blood sugar.  Diagnosis code E11.9 1 each 0  . clotrimazole-betamethasone (LOTRISONE) cream Use as directed to affected area twice daily as needed 15 g 1  . cyclobenzaprine (FLEXERIL) 5 MG tablet Take 1 tablet (5 mg total) by mouth 3 (three) times daily as needed for muscle spasms. 30 tablet 1  . glipiZIDE (GLUCOTROL XL) 10 MG 24 hr tablet Take 1 tablet by mouth once daily with breakfast 90 tablet 1  . glucose  blood (ONE TOUCH TEST STRIPS) test strip Use as instructed once daily to check blood sugar.  Diagnosis code E11.9 100 each 12  . hydrochlorothiazide (HYDRODIURIL) 25 MG tablet Take 1 tablet by mouth once daily 90 tablet 1  . JARDIANCE 25 MG TABS tablet Take 1 tablet by mouth once daily 90 tablet 1  . Lancets MISC Use as directed once daily to check blood sugar.  Diagnosis code E11.9 100 each 11  . losartan (COZAAR) 100 MG tablet Take 1 tablet by mouth once daily 90 tablet 0  . metFORMIN (GLUCOPHAGE) 500 MG tablet 2 tab by mouth in the AM, and 1 in the PM 270 tablet 3  . nortriptyline (PAMELOR) 50 MG capsule Take 1 capsule (50 mg total) by mouth 3 (three) times daily. 90  capsule 5  . tiZANidine (ZANAFLEX) 4 MG tablet Take 4 mg by mouth 3 (three) times daily.     No current facility-administered medications on file prior to visit.    Review of Systems  Constitutional: Negative for other unusual diaphoresis or sweats HENT: Negative for ear discharge or swelling Eyes: Negative for other worsening visual disturbances Respiratory: Negative for stridor or other swelling  Gastrointestinal: Negative for worsening distension or other blood Genitourinary: Negative for retention or other urinary change Musculoskeletal: Negative for other MSK pain or swelling Skin: Negative for color change or other new lesions Neurological: Negative for worsening tremors and other numbness  Psychiatric/Behavioral: Negative for worsening agitation or other fatigue All otherwise neg per pt     Objective:   Physical Exam BP 124/88   Pulse 94   Temp 97.9 F (36.6 C) (Oral)   Ht 5' 9"  (1.753 m)   Wt 180 lb (81.6 kg)   SpO2 96%   BMI 26.58 kg/m  VS noted,  Constitutional: Pt appears in NAD HENT: Head: NCAT.  Right Ear: External ear normal.  Left Ear: External ear normal.  Eyes: . Pupils are equal, round, and reactive to light. Conjunctivae and EOM are normal Nose: without d/c or deformity Neck: Neck supple. Gross normal ROM Cardiovascular: Normal rate and regular rhythm.   Pulmonary/Chest: Effort normal and breath sounds without rales or wheezing.  Abd:  Soft, NT, ND, + BS, no organomegaly with small left inguinal swelling Neurological: Pt is alert. At baseline orientation, motor grossly intact Skin: Skin is warm. No rashes, other new lesions, no LE edema Psychiatric: Pt behavior is normal without agitation , + depressed affect All otherwise neg per pt Lab Results  Component Value Date   WBC 12.3 (H) 10/06/2018   HGB 17.8 (H) 10/06/2018   HCT 51.9 10/06/2018   PLT 260 10/06/2018   GLUCOSE 140 (H) 10/06/2018   CHOL 136 05/25/2018   TRIG 363.0 (H) 05/25/2018   HDL  37.70 (L) 05/25/2018   LDLDIRECT 70.0 05/25/2018   ALT 11 10/06/2018   AST 11 (L) 10/06/2018   NA 139 10/06/2018   K 3.4 (L) 10/06/2018   CL 102 10/06/2018   CREATININE 1.00 10/06/2018   BUN 17 10/06/2018   CO2 26 10/06/2018   TSH 0.47 11/24/2017   PSA 1.84 11/24/2017   HGBA1C 6.8 (H) 05/25/2018   MICROALBUR 2.8 (H) 11/24/2017       Assessment & Plan:

## 2018-12-06 NOTE — Assessment & Plan Note (Signed)
For urine studies, for urology referral, with smoking may need to r/o bladder ca

## 2018-12-06 NOTE — Assessment & Plan Note (Signed)
stable overall by history and exam, recent data reviewed with pt, and pt to continue medical treatment as before,  to f/u any worsening symptoms or concerns for lab today

## 2018-12-06 NOTE — Assessment & Plan Note (Addendum)
Etiology uclear, for GI referral , may need egd with colonoscopy  Note:  Total time for pt hx, exam, review of record with pt in the room, determination of diagnoses and plan for further eval and tx is > 40 min, with over 50% spent in coordination and counseling of patient including the differential dx, tx, further evaluation and other management of wt loss, DM, smoker, urinary urgency, left inguinal hernia, depression, gallstones, colon polyp

## 2018-12-06 NOTE — Assessment & Plan Note (Signed)
Asympt, cont to monitor

## 2018-12-06 NOTE — Patient Instructions (Signed)
Please take all new medication as prescribed - the celexa 20 mg per day  Please continue all other medications as before, and refills have been done if requested.  Please have the pharmacy call with any other refills you may need.  Please continue your efforts at being more active, low cholesterol diet, and weight control.  You are otherwise up to date with prevention measures today.  Please keep your appointments with your specialists as you may have planned  You will be contacted regarding the referral for: Dr Fuller Plan, Dr Molli Posey, and Dr Jeffie Pollock  Please go to the LAB in the Basement (turn left off the elevator) for the tests to be done today  You will be contacted by phone if any changes need to be made immediately.  Otherwise, you will receive a letter about your results with an explanation, but please check with MyChart first.  Please remember to sign up for MyChart if you have not done so, as this will be important to you in the future with finding out test results, communicating by private email, and scheduling acute appointments online when needed.  Please return in 3 months, or sooner if needed

## 2018-12-07 ENCOUNTER — Telehealth: Payer: Self-pay

## 2018-12-07 ENCOUNTER — Encounter: Payer: Self-pay | Admitting: Internal Medicine

## 2018-12-07 ENCOUNTER — Other Ambulatory Visit: Payer: Self-pay | Admitting: Internal Medicine

## 2018-12-07 LAB — URINALYSIS, ROUTINE W REFLEX MICROSCOPIC
Bilirubin Urine: NEGATIVE
Hgb urine dipstick: NEGATIVE
Ketones, ur: NEGATIVE
Leukocytes,Ua: NEGATIVE
Nitrite: NEGATIVE
RBC / HPF: NONE SEEN (ref 0–?)
Specific Gravity, Urine: >=1.03 — AB (ref 1.000–1.030)
Total Protein, Urine: NEGATIVE
Urine Glucose: NEGATIVE
Urobilinogen, UA: 0.2 (ref 0.0–1.0)
pH: 5 (ref 5.0–8.0)

## 2018-12-07 LAB — VITAMIN D 25 HYDROXY (VIT D DEFICIENCY, FRACTURES): VITD: 19.14 ng/mL — ABNORMAL LOW (ref 30.00–100.00)

## 2018-12-07 LAB — VITAMIN B12: Vitamin B-12: 235 pg/mL (ref 211–911)

## 2018-12-07 MED ORDER — VITAMIN D (ERGOCALCIFEROL) 1.25 MG (50000 UNIT) PO CAPS: 50000.0000 [IU] | ORAL_CAPSULE | ORAL | 0 refills | Status: DC

## 2018-12-07 NOTE — Telephone Encounter (Signed)
Called pt, LVM.   CRM created.  

## 2018-12-07 NOTE — Telephone Encounter (Signed)
-----   Message from Biagio Borg, MD sent at 12/07/2018 12:12 PM EDT ----- Letter sent, cont same tx  excpet  The test results show that your current treatment is OK, as the tests are stable, except the Vitamin D level is low.  Please take Vitamin D 50000 units weekly for 12 weeks, then plan to change to OTC Vitamin D3 at 2000 units per day, indefinitely.Redmond Baseman to please inform pt, I will do rx

## 2018-12-08 LAB — URINE CULTURE
MICRO NUMBER:: 913797
SPECIMEN QUALITY:: ADEQUATE

## 2018-12-12 DIAGNOSIS — M7552 Bursitis of left shoulder: Secondary | ICD-10-CM | POA: Diagnosis not present

## 2018-12-12 DIAGNOSIS — M7592 Shoulder lesion, unspecified, left shoulder: Secondary | ICD-10-CM | POA: Diagnosis not present

## 2018-12-12 DIAGNOSIS — M7522 Bicipital tendinitis, left shoulder: Secondary | ICD-10-CM | POA: Diagnosis not present

## 2018-12-12 DIAGNOSIS — M25511 Pain in right shoulder: Secondary | ICD-10-CM | POA: Diagnosis not present

## 2018-12-13 ENCOUNTER — Ambulatory Visit (INDEPENDENT_AMBULATORY_CARE_PROVIDER_SITE_OTHER): Payer: Medicare Other | Admitting: Orthopaedic Surgery

## 2018-12-13 ENCOUNTER — Encounter: Payer: Self-pay | Admitting: Orthopaedic Surgery

## 2018-12-13 DIAGNOSIS — S46012A Strain of muscle(s) and tendon(s) of the rotator cuff of left shoulder, initial encounter: Secondary | ICD-10-CM

## 2018-12-13 DIAGNOSIS — M25512 Pain in left shoulder: Secondary | ICD-10-CM | POA: Diagnosis not present

## 2018-12-13 NOTE — Progress Notes (Signed)
Office Visit Note   Patient: Ethan Ellis           Date of Birth: Apr 15, 1960           MRN: [id number] Visit Date: 12/13/2018              Requested by: Biagio Borg, MD [address],  [place] [address] PCP: Biagio Borg, MD   Assessment & Plan: Visit Diagnoses:  1. Acute pain of left shoulder   2. Traumatic complete tear of left rotator cuff, initial encounter     Plan: Based on his MRI findings and his clinical exam findings we are recommending a left shoulder arthroscopy with extensive debridement, subacromial decompression, distal clavicle resection and repair of the rotator cuff.  I explained in detail what the surgery involves as well as the discussion of risk and benefits of surgery.  He states that he would like to have this done before the end of the year but would like to give [place] a call about when to schedule surgery so I gave him our surgery scheduler's card.  All question concerns were answered and addressed.  Follow-Up Instructions: Return in about 4 weeks (around 01/10/2019) for 1 week post-op.   Orders:  No orders of the defined types were placed in this encounter.  No orders of the defined types were placed in this encounter.     Procedures: No procedures performed   Clinical Data: No additional findings.   Subjective: Chief Complaint  Patient presents with   Right Shoulder - Pain   Left Shoulder - Pain  The patient is someone I have actually seen before.  He comes today with a chief complaint of left shoulder pain after an acute fall.  He actually had an MRI x-ray of his left shoulder but he also has an MRI of his right shoulder from earlier this year.  I have actually performed a left shoulder arthroscopy with subacromial decompression with that shoulder before.  He is actually had 3 operations by someone else on his right shoulder.  He says his shoulder hurts quite a bit he has had an injection did not help in the left shoulder.  It  was more of an acute fall recently that injured this left shoulder.  He is able to do overhead activities but is very pain for him to do that.  He is a diabetic but reports okay diabetic control.  He is on disability for back issues.  HPI  Review of Systems He currently denies any headache, chest pain, shortness of breath, fever, chills, nausea, vomiting  Objective: Vital Signs: There were no vitals taken for this visit.  Physical Exam He is alert and oriented x3 and in no acute distress Ortho Exam Examination of both shoulder shows actually good range of motion that is almost full bilaterally.  There is only some slight weakness in the shoulders bilaterally but certainly they are both painful.  His left shoulder is significantly painful at the [facility] joint and he has positive Neer and Hawkins sign. Specialty Comments:  No specialty comments available.  Imaging: No results found. An MRI of both shoulders is reviewed and the left shoulder does show significant tendinosis of the rotator cuff with also full-thickness tear of the supraspinatus tendon.  There is severe AC joint arthropathy as well.  The right shoulder does show chronic tearing of the rotator cuff but no muscle atrophy.  There is been a previous subacromial  decompression on both shoulders.  There is been more of an [facility] joint resection only right shoulder.  We did not do this on the left shoulder.  PMFS History: Patient Active Problem List   Diagnosis Date Noted   Cholelithiasis 12/06/2018   Left inguinal hernia 12/06/2018   Weight loss 12/06/2018   Adenomatous polyp 12/06/2018   Depression 12/06/2018   Urinary urgency 12/06/2018   Left upper arm pain 11/24/2017   Cervical radiculopathy 06/09/2017   Left lateral epicondylitis 05/23/2017   Lesion of penis 09/14/2016   Rash 09/14/2016   Asthma with acute exacerbation 01/27/2016   Allergic rhinitis 01/27/2016   Dyspnea 01/02/2016   Unspecified viral hepatitis C  without hepatic coma 04/04/2015   Hyperlipidemia 03/10/2014   Uncontrolled hypertension 02/22/2014   Uncontrolled diabetes mellitus ([place]) 02/22/2014   Tobacco dependence 12/30/2010   Fatigue 12/30/2010   Encounter for long-term (current) use of other medications 12/25/2010   Paresthesia 12/08/2010   FREQUENCY, URINARY 01/29/2009   Neoplasm of uncertain behavior of skin 04/24/2008   Cough 12/04/2007   LOW BACK PAIN 06/28/2007   PAIN IN SOFT TISSUES OF LIMB 06/19/2007   Past Medical History:  Diagnosis Date   Depression    Diabetes mellitus without complication ([place])    type 2   History of kidney stones    passed stone - no surgery   Hyperlipidemia 03/10/2014   Hypertension    LBP (low back pain) 2009   Right   Unspecified viral hepatitis C without hepatic coma 04/04/2015    Family History  Problem Relation Age of Onset   Diabetes Father    Colon cancer Neg Hx    Colon polyps Neg Hx    Rectal cancer Neg Hx    Stomach cancer Neg Hx     Past Surgical History:  Procedure Laterality Date   APPENDECTOMY     BACK SURGERY     lower back    SHOULDER SURGERY     right - x 3 surgery   Social History   Occupational History   Not on file  Tobacco Use   Smoking status: Current Every Day Smoker    Packs/day: 0.50    Years: 40.00    Pack years: 20.00    Types: Cigarettes   Smokeless tobacco: Never Used  Substance and Sexual Activity   Alcohol use: No    Alcohol/week: 0.0 standard drinks   Drug use: Yes    Types: Marijuana    Comment: states he stopped   Sexual activity: Yes

## 2018-12-17 ENCOUNTER — Other Ambulatory Visit: Payer: Self-pay | Admitting: Internal Medicine

## 2019-01-15 ENCOUNTER — Encounter: Payer: Self-pay | Admitting: Internal Medicine

## 2019-01-25 DIAGNOSIS — N401 Enlarged prostate with lower urinary tract symptoms: Secondary | ICD-10-CM | POA: Diagnosis not present

## 2019-01-25 DIAGNOSIS — R350 Frequency of micturition: Secondary | ICD-10-CM | POA: Diagnosis not present

## 2019-01-25 DIAGNOSIS — N5201 Erectile dysfunction due to arterial insufficiency: Secondary | ICD-10-CM | POA: Diagnosis not present

## 2019-01-25 DIAGNOSIS — N3941 Urge incontinence: Secondary | ICD-10-CM | POA: Diagnosis not present

## 2019-01-25 DIAGNOSIS — Z87442 Personal history of urinary calculi: Secondary | ICD-10-CM | POA: Diagnosis not present

## 2019-02-22 ENCOUNTER — Inpatient Hospital Stay: Payer: Medicare Other | Admitting: Physician Assistant

## 2019-02-26 DIAGNOSIS — L989 Disorder of the skin and subcutaneous tissue, unspecified: Secondary | ICD-10-CM | POA: Diagnosis not present

## 2019-02-26 DIAGNOSIS — K4090 Unilateral inguinal hernia, without obstruction or gangrene, not specified as recurrent: Secondary | ICD-10-CM | POA: Diagnosis not present

## 2019-03-01 ENCOUNTER — Other Ambulatory Visit: Payer: Self-pay | Admitting: Orthopaedic Surgery

## 2019-03-01 ENCOUNTER — Encounter: Payer: Self-pay | Admitting: Orthopaedic Surgery

## 2019-03-01 DIAGNOSIS — S46012D Strain of muscle(s) and tendon(s) of the rotator cuff of left shoulder, subsequent encounter: Secondary | ICD-10-CM

## 2019-03-01 DIAGNOSIS — M75122 Complete rotator cuff tear or rupture of left shoulder, not specified as traumatic: Secondary | ICD-10-CM

## 2019-03-01 MED ORDER — OXYCODONE HCL 5 MG PO TABS: 5.0000 mg | ORAL_TABLET | Freq: Four times a day (QID) | ORAL | 0 refills | Status: DC | PRN

## 2019-03-12 ENCOUNTER — Other Ambulatory Visit: Payer: Self-pay

## 2019-03-12 ENCOUNTER — Ambulatory Visit (INDEPENDENT_AMBULATORY_CARE_PROVIDER_SITE_OTHER): Payer: Medicare Other | Admitting: Orthopaedic Surgery

## 2019-03-12 ENCOUNTER — Encounter: Payer: Self-pay | Admitting: Orthopaedic Surgery

## 2019-03-12 DIAGNOSIS — Z9889 Other specified postprocedural states: Secondary | ICD-10-CM

## 2019-03-12 MED ORDER — OXYCODONE HCL 5 MG PO TABS: 5.0000 mg | ORAL_TABLET | Freq: Four times a day (QID) | ORAL | 0 refills | Status: DC | PRN

## 2019-03-12 NOTE — Progress Notes (Signed)
The patient is status post a left shoulder rotator cuff repair.  He is not wearing a sling today but he says he has been at home.  He is very week out from surgery.  We had to repair a significant rotator cuff tear.  He also had a disrupted biceps tendon that we did not repair.  This was a chronic proximal biceps tendon tear.  He says he is doing well overall.  On exam I remove the sutures from his left shoulder.  No evidence of infection.  His axillary nerve is working.  I can hold the shoulder abducted and he can hold it there.  He understands we need to get him into physical therapy to work on range of motion and strengthening of his shoulder with going slowly.  He understands that this can take 5 to 6 months to recover from.  I will refill his oxycodone.  We will see him back in 4 weeks to see how he is doing overall after course of some therapy.  All question concerns were answered and addressed.

## 2019-03-20 ENCOUNTER — Ambulatory Visit (INDEPENDENT_AMBULATORY_CARE_PROVIDER_SITE_OTHER): Payer: Medicare Other | Admitting: Physical Therapy

## 2019-03-20 ENCOUNTER — Other Ambulatory Visit: Payer: Self-pay

## 2019-03-20 ENCOUNTER — Encounter: Payer: Self-pay | Admitting: Physical Therapy

## 2019-03-20 DIAGNOSIS — M6281 Muscle weakness (generalized): Secondary | ICD-10-CM

## 2019-03-20 DIAGNOSIS — R600 Localized edema: Secondary | ICD-10-CM

## 2019-03-20 DIAGNOSIS — R293 Abnormal posture: Secondary | ICD-10-CM

## 2019-03-20 DIAGNOSIS — M25512 Pain in left shoulder: Secondary | ICD-10-CM

## 2019-03-20 DIAGNOSIS — M25612 Stiffness of left shoulder, not elsewhere classified: Secondary | ICD-10-CM

## 2019-03-20 NOTE — Therapy (Signed)
[facility] Physical Therapy [address] [place], [place], [address] Phone: [phone]   Fax:  [phone]  Physical Therapy Evaluation  Patient Details  Name: Ethan Ellis MRN: [id number] Date of Birth: 08/02/1960 Referring Provider (PT): Mcarthur Rossetti, MD   Encounter Date: 03/20/2019  PT End of Session - 03/20/19 1252    Visit Number  1    Number of Visits  24    Date for PT Re-Evaluation  05/15/19    PT Start Time  0847    PT Stop Time  0924    PT Time Calculation (min)  37 min    Activity Tolerance  Patient tolerated treatment well    Behavior During Therapy  [facility] for tasks assessed/performed       Past Medical History:  Diagnosis Date  . Depression   . Diabetes mellitus without complication ([place])    type 2  . History of kidney stones    passed stone - no surgery  . Hyperlipidemia 03/10/2014  . Hypertension   . LBP (low back pain) 2009   Right  . Unspecified viral hepatitis C without hepatic coma 04/04/2015    Past Surgical History:  Procedure Laterality Date  . APPENDECTOMY    . BACK SURGERY     lower back   . SHOULDER SURGERY     right - x 3 surgery    There were no vitals filed for this visit.   Subjective Assessment - 03/20/19 0849    Subjective  Pt is a 59 y/o male who presents to OPPT s/p Lt RTC repair on 03/01/19.    Patient Stated Goals  full range, no pain and improvement in function    Currently in Pain?  Yes    Pain Score  6    up to 10/10; at best 4/10   Pain Location  Shoulder    Pain Orientation  Left    Pain Descriptors / Indicators  Aching;Dull;Constant    Pain Type  Surgical pain;Acute pain    Pain Onset  1 to 4 weeks ago    Pain Frequency  Constant    Aggravating Factors   unknown, increases as pain meds wear off    Pain Relieving Factors  medication, holding arm close/wearing sling         [facility] PT Assessment - 03/20/19 0852      Assessment   Medical Diagnosis  Z98.890 (ICD-10-CM) - Status  post left rotator cuff repair    Referring Provider (PT)  Mcarthur Rossetti, MD    Onset Date/Surgical Date  03/01/19    Hand Dominance  Right    Next MD Visit  04/09/19    Prior Therapy  following Rt shoulder surgery      Precautions   Precautions  Shoulder      Restrictions   Weight Bearing Restrictions  No      Balance Screen   Has the patient fallen in the past 6 months  Yes    How many times?  1 - after surgery    Has the patient had a decrease in activity level because of a fear of falling?   No    Is the patient reluctant to leave their home because of a fear of falling?   No      Home Environment   Living Environment  Private residence    Living Arrangements  Alone    Additional Comments  sister is available to assist; I with ADLs -  modified due to shoulder      Prior Function   Level of Independence  Independent    Vocation  On disability    Leisure  go to church; walking every other day - approx 1 mile      Cognition   Overall Cognitive Status  Within Functional Limits for tasks assessed      Posture/Postural Control   Posture/Postural Control  Postural limitations    Postural Limitations  Rounded Shoulders;Forward head      ROM / Strength   AROM / PROM / Strength  PROM   strength deferred     PROM   PROM Assessment Site  Shoulder    Right/Left Shoulder  Left    Left Shoulder Flexion  125 Degrees    Left Shoulder ABduction  102 Degrees    Left Shoulder Internal Rotation  77 Degrees    Left Shoulder External Rotation  53 Degrees                Objective measurements completed on examination: See above findings.      [facility] Adult PT Treatment/Exercise - 03/20/19 0852      Exercises   Exercises  Shoulder      Shoulder Exercises: Seated   Retraction  Both;5 reps      Shoulder Exercises: ROM/Strengthening   Pendulum  standing A/P, lateral and circles             PT Education - 03/20/19 1252    Education Details  HEP     Person(s) Educated  Patient    Methods  Explanation;Demonstration;Handout    Comprehension  Verbalized understanding;Returned demonstration;Need further instruction       PT Short Term Goals - 03/20/19 1256      PT SHORT TERM GOAL #1   Title  independent with initial HEP    Status  New    Target Date  04/17/19      PT SHORT TERM GOAL #2   Title  Lt shoulder PROM improved by 15 deg all planes for improved motion    Status  New    Target Date  04/17/19        PT Long Term Goals - 03/20/19 1256      PT LONG TERM GOAL #1   Title  independent with HEP    Status  New    Target Date  06/12/19      PT LONG TERM GOAL #2   Title  improve Lt shoulder AROM to [facility] for improved function    Status  New    Target Date  06/12/19      PT LONG TERM GOAL #3   Title  demonstrate at least 4/5 Lt shoulder strength for improved function    Status  New    Target Date  06/12/19      PT LONG TERM GOAL #4   Title  report pain < 3/10 with activity for improved function    Status  New    Target Date  06/12/19             Plan - 03/20/19 1253    Clinical Impression Statement  Pt is a 59 y/o male who presents to OPPT s/p Lt RTC repair on 03/01/19.  Pt demonstrates decreased ROM and strength, and pain affecting function.  Pt will benefit from PT to address deficits listed.    Personal Factors and Comorbidities  Comorbidity 3+    Comorbidities  depression, DM, HTN, LBP,  hep C    Examination-Activity Limitations  Bathing;Lift;Reach Overhead;Carry;Hygiene/Grooming;Dressing    Examination-Participation Restrictions  Community Activity    Stability/Clinical Decision Making  Evolving/Moderate complexity    Clinical Decision Making  Moderate    Rehab Potential  Good    PT Frequency  2x / week    PT Duration  12 weeks    PT Treatment/Interventions  ADLs/Self Care Home Management;Cryotherapy;Electrical Stimulation;Ultrasound;Moist Heat;Functional mobility training;Therapeutic  activities;Therapeutic exercise;Patient/family education;Neuromuscular re-education;Manual techniques;Vasopneumatic Device;Taping;Dry needling;Passive range of motion;Scar mobilization    PT Next Visit Plan  review pendulums, PROM    PT Home Exercise Plan  Access Code: [facility]       Patient will benefit from skilled therapeutic intervention in order to improve the following deficits and impairments:  Increased fascial restricitons, Pain, Postural dysfunction, Increased edema, Impaired flexibility, Decreased range of motion, Decreased strength, Impaired UE functional use, Increased muscle spasms  Visit Diagnosis: Acute pain of left shoulder - Plan: PT plan of care cert/re-cert  Stiffness of left shoulder, not elsewhere classified - Plan: PT plan of care cert/re-cert  Abnormal posture - Plan: PT plan of care cert/re-cert  Muscle weakness (generalized) - Plan: PT plan of care cert/re-cert  Localized edema - Plan: PT plan of care cert/re-cert     Problem List Patient Active Problem List   Diagnosis Date Noted  . Status post left rotator cuff repair 03/12/2019  . Cholelithiasis 12/06/2018  . Left inguinal hernia 12/06/2018  . Weight loss 12/06/2018  . Adenomatous polyp 12/06/2018  . Depression 12/06/2018  . Urinary urgency 12/06/2018  . Left upper arm pain 11/24/2017  . Cervical radiculopathy 06/09/2017  . Left lateral epicondylitis 05/23/2017  . Lesion of penis 09/14/2016  . Rash 09/14/2016  . Asthma with acute exacerbation 01/27/2016  . Allergic rhinitis 01/27/2016  . Dyspnea 01/02/2016  . Unspecified viral hepatitis C without hepatic coma 04/04/2015  . Hyperlipidemia 03/10/2014  . Uncontrolled hypertension 02/22/2014  . Uncontrolled diabetes mellitus ([place]) 02/22/2014  . Tobacco dependence 12/30/2010  . Fatigue 12/30/2010  . Encounter for long-term (current) use of other medications 12/25/2010  . Paresthesia 12/08/2010  . FREQUENCY, URINARY 01/29/2009  . Neoplasm of  uncertain behavior of skin 04/24/2008  . Cough 12/04/2007  . LOW BACK PAIN 06/28/2007  . PAIN IN SOFT TISSUES OF LIMB 06/19/2007      Laureen Abrahams, PT, DPT 03/20/19 12:59 PM     [facility] Physical Therapy [address] [place], [place], [address] Phone: [phone]   Fax:  [phone]  Name: Ethan Ellis MRN: [id number] Date of Birth: 11-02-1960

## 2019-03-20 NOTE — Patient Instructions (Signed)
Access Code: [facility]  URL: [email or web address]  Date: 03/20/2019  Prepared by: Faustino Congress   Exercises Circular Shoulder Pendulum with Table Support - 20 reps - 1 sets - 3-4x daily - 7x weekly Flexion-Extension Shoulder Pendulum with Table Support - 20 reps - 1 sets - 3-4x daily - 7x weekly Horizontal Shoulder Pendulum with Table Support - 20 reps - 1 sets - 3-4x daily - 7x weekly Seated Scapular Retraction - 10 reps - 1 sets - 5 sec hold - 2x daily - 7x weekly

## 2019-03-21 ENCOUNTER — Other Ambulatory Visit: Payer: Self-pay | Admitting: Internal Medicine

## 2019-03-21 NOTE — Telephone Encounter (Signed)
Please refill as per office routine med refill policy (all routine meds refilled for 3 mo or monthly per pt preference up to one year from last visit, then month to month grace period for 3 mo, then further med refills will have to be denied)  

## 2019-03-23 ENCOUNTER — Encounter: Payer: Self-pay | Admitting: Physical Therapy

## 2019-03-23 ENCOUNTER — Other Ambulatory Visit: Payer: Self-pay

## 2019-03-23 ENCOUNTER — Ambulatory Visit (INDEPENDENT_AMBULATORY_CARE_PROVIDER_SITE_OTHER): Payer: Medicare HMO | Admitting: Physical Therapy

## 2019-03-23 DIAGNOSIS — M25612 Stiffness of left shoulder, not elsewhere classified: Secondary | ICD-10-CM

## 2019-03-23 DIAGNOSIS — R600 Localized edema: Secondary | ICD-10-CM

## 2019-03-23 DIAGNOSIS — R293 Abnormal posture: Secondary | ICD-10-CM

## 2019-03-23 DIAGNOSIS — M25512 Pain in left shoulder: Secondary | ICD-10-CM | POA: Diagnosis not present

## 2019-03-23 DIAGNOSIS — M6281 Muscle weakness (generalized): Secondary | ICD-10-CM

## 2019-03-23 NOTE — Therapy (Signed)
[facility] [address] [place], [place], [address] Phone: [phone]   Fax:  [phone]  Physical Therapy Treatment  Patient Details  Name: Ethan Ellis MRN: [id number] Date of Birth: [birth year] Referring Provider (PT): Mcarthur Rossetti, MD   Encounter Date: 03/23/2019  PT End of Session - 03/23/19 0917    Visit Number  2    Number of Visits  24    Date for PT Re-Evaluation  05/15/19    PT Start Time  0842    PT Stop Time  0926    PT Time Calculation (min)  44 min    Activity Tolerance  Patient tolerated treatment well    Behavior During Therapy  [facility] for tasks assessed/performed       Past Medical History:  Diagnosis Date  . Depression   . Diabetes mellitus without complication ([place])    type 2  . History of kidney stones    passed stone - no surgery  . Hyperlipidemia 03/10/2014  . Hypertension   . LBP (low back pain) 2009   Right  . Unspecified viral hepatitis C without hepatic coma 04/04/2015    Past Surgical History:  Procedure Laterality Date  . APPENDECTOMY    . BACK SURGERY     lower back   . SHOULDER SURGERY     right - x 3 surgery    There were no vitals filed for this visit.  Subjective Assessment - 03/23/19 0844    Subjective  had to take sling off the past couple nights due to pain. may try sleeping in recliner tonight instead.    Patient Stated Goals  full range, no pain and improvement in function    Currently in Pain?  Yes    Pain Score  5     Pain Location  Shoulder    Pain Orientation  Left    Pain Descriptors / Indicators  Aching;Dull;Constant    Pain Type  Acute pain;Surgical pain    Pain Onset  1 to 4 weeks ago    Pain Frequency  Constant    Aggravating Factors   unknown, increases as pain meds wear off    Pain Relieving Factors  meds, holding arm close, sling                       [facility] Adult PT Treatment/Exercise - 03/23/19 0846      Shoulder Exercises: Supine    Other Supine Exercises  scap retraction 20 x 5 sec      Shoulder Exercises: Seated   Retraction  Both;20 reps    Other Seated Exercises  shoulder rolls backwards x 20 reps    Other Seated Exercises  scapular depression 20 x 5 sec      Modalities   Modalities  Vasopneumatic      Vasopneumatic   Number Minutes Vasopneumatic   10 minutes    Vasopnuematic Location   Shoulder    Vasopneumatic Pressure  Low    Vasopneumatic Temperature   34      Manual Therapy   Manual Therapy  Passive ROM    Passive ROM  Lt shoulder flexion/abduction/er/ir               PT Short Term Goals - 03/20/19 1256      PT SHORT TERM GOAL #1   Title  independent with initial HEP    Status  New    Target Date  04/17/19  PT SHORT TERM GOAL #2   Title  Lt shoulder PROM improved by 15 deg all planes for improved motion    Status  New    Target Date  04/17/19        PT Long Term Goals - 03/20/19 1256      PT LONG TERM GOAL #1   Title  independent with HEP    Status  New    Target Date  06/12/19      PT LONG TERM GOAL #2   Title  improve Lt shoulder AROM to [facility] for improved function    Status  New    Target Date  06/12/19      PT LONG TERM GOAL #3   Title  demonstrate at least 4/5 Lt shoulder strength for improved function    Status  New    Target Date  06/12/19      PT LONG TERM GOAL #4   Title  report pain < 3/10 with activity for improved function    Status  New    Target Date  06/12/19            Plan - 03/23/19 0917    Clinical Impression Statement  Pt tolerated session well today with minimal increase in baseline pain.  Will continue to benefit from PT to maximize function.  No goals met as only 2nd visit.    Personal Factors and Comorbidities  Comorbidity 3+    Comorbidities  depression, DM, HTN, LBP, hep C    Examination-Activity Limitations  Bathing;Lift;Reach Overhead;Carry;Hygiene/Grooming;Dressing    Examination-Participation Restrictions  Community Activity     Stability/Clinical Decision Making  Evolving/Moderate complexity    Rehab Potential  Good    PT Frequency  2x / week    PT Duration  12 weeks    PT Treatment/Interventions  ADLs/Self Care Home Management;Cryotherapy;Electrical Stimulation;Ultrasound;Moist Heat;Functional mobility training;Therapeutic activities;Therapeutic exercise;Patient/family education;Neuromuscular re-education;Manual techniques;Vasopneumatic Device;Taping;Dry needling;Passive range of motion;Scar mobilization    PT Next Visit Plan  review pendulums, PROM    PT Home Exercise Plan  Access Code: [facility]       Patient will benefit from skilled therapeutic intervention in order to improve the following deficits and impairments:  Increased fascial restricitons, Pain, Postural dysfunction, Increased edema, Impaired flexibility, Decreased range of motion, Decreased strength, Impaired UE functional use, Increased muscle spasms  Visit Diagnosis: Acute pain of left shoulder  Stiffness of left shoulder, not elsewhere classified  Abnormal posture  Muscle weakness (generalized)  Localized edema     Problem List Patient Active Problem List   Diagnosis Date Noted  . Status post left rotator cuff repair 03/12/2019  . Cholelithiasis 12/06/2018  . Left inguinal hernia 12/06/2018  . Weight loss 12/06/2018  . Adenomatous polyp 12/06/2018  . Depression 12/06/2018  . Urinary urgency 12/06/2018  . Left upper arm pain 11/24/2017  . Cervical radiculopathy 06/09/2017  . Left lateral epicondylitis 05/23/2017  . Lesion of penis 09/14/2016  . Rash 09/14/2016  . Asthma with acute exacerbation 01/27/2016  . Allergic rhinitis 01/27/2016  . Dyspnea 01/02/2016  . Unspecified viral hepatitis C without hepatic coma 04/04/2015  . Hyperlipidemia 03/10/2014  . Uncontrolled hypertension 02/22/2014  . Uncontrolled diabetes mellitus ([place]) 02/22/2014  . Tobacco dependence 12/30/2010  . Fatigue 12/30/2010  . Encounter for long-term  (current) use of other medications 12/25/2010  . Paresthesia 12/08/2010  . FREQUENCY, URINARY 01/29/2009  . Neoplasm of uncertain behavior of skin 04/24/2008  . Cough 12/04/2007  . LOW BACK PAIN 06/28/2007  .  PAIN IN SOFT TISSUES OF LIMB 06/19/2007      Laureen Abrahams, PT, DPT 03/23/19 9:20 AM     [facility] [address] [place], [place], [address] Phone: [phone]   Fax:  [phone]  Name: Ethan Ellis MRN: [id number] Date of Birth: [birth year]

## 2019-03-30 ENCOUNTER — Ambulatory Visit: Payer: Self-pay | Admitting: Surgery

## 2019-03-30 NOTE — H&P (Signed)
History of Present Illness Ethan Ellis. Ethan Doepke MD; 03/30/2019 11:56 AM) The patient is a 59 year old male who presents with an inguinal hernia. Referred by Dr. Zoila Shutter, Hematology for persistent elevated WBC, left buttock skin lesion  This is a 59 year old male with history of tobacco abuse smoking 2 packs per day 40 years who presented with persistent leukocytosis. The patient is diabetic. He was referred by Dr. Jenny Reichmann to Hematology. His white count has been persistently elevated at 17.3. Prior to that in September his white count was elevated at 12.6. The patient denies fevers chills or night sweats. He has spirituous fatigue and easy bleeding/bruising. At his examination on 06/30/18, he was not noted to have any lymphadenopathy. The patient has had a persistent area on the left buttocks over the last 6 months that has been read and tender. The patient also reports a pigmented skin lesion on the proximal medial left thigh. Reportedly this was biopsied several years ago and returned a diagnosis of seborrheic keratosis. The skin lesion has returned. The patient has not had any recent imaging.  We obtained a CT scan of the chest abdomen and pelvis in June of this year that showed no concerning adenopathy, mass, or other acute findings. He did have some incidental cholelithiasis. It was not mentioned on the CT scan report, but the patient does have a left inguinal hernia seems to contain only fat. The hernia has enlarged slightly since June. He denies any obstructive symptoms. He remains reducible. The patient had shoulder surgery in December and is doing well. He presents now to discuss left inguinal hernia repair as well as excision of 2 skin lesions. One is located in his left groin. The other one is much smaller and located at the base of his penis.   Problem List/Past Medical Ethan Key K. Krysta Bloomfield, MD; 03/30/2019 11:57 AM) INGUINAL LYMPHADENOPATHY (R59.0) INGUINAL HERNIA OF LEFT SIDE  WITHOUT OBSTRUCTION OR GANGRENE (K40.90) SKIN LESION (L98.9)  Past Surgical History (Ethan Ellis K. Keyasha Miah, MD; 03/30/2019 11:57 AM) Appendectomy Oral Surgery Shoulder Surgery Bilateral. Spinal Surgery - Lower Back  Diagnostic Studies History Ethan Ellis. Ethan Rambo, MD; 03/30/2019 11:57 AM) Colonoscopy 1-5 years ago  Allergies Ethan Ellis, CMA; 03/30/2019 10:50 AM) Naproxen *ANALGESICS - ANTI-INFLAMMATORY* Allergies Reconciled  Medication History Ethan Ellis, CMA; 03/30/2019 10:50 AM) Tamsulosin HCl (0.4MG  Capsule, Oral) Active. Atorvastatin Calcium (10MG  Tablet, Oral) Active. glipiZIDE ER (10MG  Tablet ER 24HR, Oral) Active. hydroCHLOROthiazide (25MG  Tablet, Oral) Active. Jardiance (25MG  Tablet, Oral) Active. Losartan Potassium (100MG  Tablet, Oral) Active. metFORMIN HCl ER (500MG  Tablet ER 24HR, Oral) Active. Alprostadil (500MCG/ML Solution, Injection) Active. tiZANidine HCl (4MG  Tablet, Oral) Active. Nortriptyline HCl (50MG  Capsule, Oral) Active. Aspirin (81MG  Tablet, Oral) Active. oxyCODONE HCl (5MG  Tablet, Oral) Active. Medications Reconciled  Social History Ethan Ellis. Ethan Kampf, MD; 03/30/2019 11:57 AM) Alcohol use Occasional alcohol use. Caffeine use Carbonated beverages, Coffee, Tea. Tobacco use Current every day smoker.  Family History Ethan Ellis. Ethan Clutter, MD; 03/30/2019 11:57 AM) Arthritis Father. Cancer Mother. Diabetes Mellitus Brother, Father, Mother, Sister.  Other Problems Ethan Ellis. Ethan Bowsher, MD; 03/30/2019 11:57 AM) Arthritis Back Pain Diabetes Mellitus High blood pressure Hypercholesterolemia    Vitals Ethan Ellis CMA; 03/30/2019 10:50 AM) 03/30/2019 10:49 AM Weight: 202.8 lb Height: 69in Body Surface Area: 2.08 m Body Mass Index: 29.95 kg/m  Temp.: 98.73F  Pulse: 116 (Regular)  BP: 158/86 (Sitting, Left Arm, Standard)        Physical Exam Ethan Key K. Ruari Duggan MD; 03/30/2019 11:57 AM)  The physical exam  findings  are as follows: Note:WDWN in NAD Eyes: Pupils equal, round; sclera anicteric HENT: Oral mucosa moist; good dentition Neck: No masses palpated, no thyromegaly Lungs: CTA bilaterally; normal respiratory effort CV: Regular rate and rhythm; no murmurs; extremities well-perfused with no edema Abd: +bowel sounds, soft, non-tender, no palpable organomegaly; no palpable hernias GU: bilateral descended testes; no testicular masses. Reducible left inguinal hernia. Left groin - 3 cm pedunculated pigmented skin lesion. Base of penis - 1 cm pedunculated skin lesion Skin: Warm, dry; no sign of jaundice Psychiatric - alert and oriented x 4; calm mood and affect    Assessment & Plan Ethan Key K. Tamatha Gadbois MD; 03/30/2019 11:13 AM)  INGUINAL HERNIA OF LEFT SIDE WITHOUT OBSTRUCTION OR GANGRENE (K40.90)   SKIN LESION (L98.9) Impression: Left groin 4 cm Base of penis 1 cm  Current Plans Schedule for Surgery - Left inguinal hernia repair with mesh. Excision of skin lesions - left groin and base of penis. The surgical procedure has been discussed with the patient. Potential risks, benefits, alternative treatments, and expected outcomes have been explained. All of the patient's questions at this time have been answered. The likelihood of reaching the patient's treatment goal is good. The patient understand the proposed surgical procedure and wishes to proceed.  Ethan Ellis. Ethan Dover, MD, [facility] Surgery  General/ Trauma Surgery   03/30/2019 11:58 AM

## 2019-03-30 NOTE — H&P (View-Only) (Signed)
History of Present Illness Ethan Ellis. Tyrene Nader MD; 03/30/2019 11:56 AM) The patient is a 59 year old male who presents with an inguinal hernia. Referred by Dr. Zoila Shutter, Hematology for persistent elevated WBC, left buttock skin lesion  This is a 59 year old male with history of tobacco abuse smoking 2 packs per day 40 years who presented with persistent leukocytosis. The patient is diabetic. He was referred by Dr. Jenny Reichmann to Hematology. His white count has been persistently elevated at 17.3. Prior to that in September his white count was elevated at 12.6. The patient denies fevers chills or night sweats. He has spirituous fatigue and easy bleeding/bruising. At his examination on 06/30/18, he was not noted to have any lymphadenopathy. The patient has had a persistent area on the left buttocks over the last 6 months that has been read and tender. The patient also reports a pigmented skin lesion on the proximal medial left thigh. Reportedly this was biopsied several years ago and returned a diagnosis of seborrheic keratosis. The skin lesion has returned. The patient has not had any recent imaging.  We obtained a CT scan of the chest abdomen and pelvis in June of this year that showed no concerning adenopathy, mass, or other acute findings. He did have some incidental cholelithiasis. It was not mentioned on the CT scan report, but the patient does have a left inguinal hernia seems to contain only fat. The hernia has enlarged slightly since June. He denies any obstructive symptoms. He remains reducible. The patient had shoulder surgery in December and is doing well. He presents now to discuss left inguinal hernia repair as well as excision of 2 skin lesions. One is located in his left groin. The other one is much smaller and located at the base of his penis.   Problem List/Past Medical [name] K. Christophor Eick, MD; 03/30/2019 11:57 AM) INGUINAL LYMPHADENOPATHY (R59.0) INGUINAL HERNIA OF LEFT SIDE  WITHOUT OBSTRUCTION OR GANGRENE (K40.90) SKIN LESION (L98.9)  Past Surgical History (Kately Graffam K. Jereline Ticer, MD; 03/30/2019 11:57 AM) Appendectomy Oral Surgery Shoulder Surgery Bilateral. Spinal Surgery - Lower Back  Diagnostic Studies History Ethan Ellis. Hawke Villalpando, MD; 03/30/2019 11:57 AM) Colonoscopy 1-5 years ago  Allergies [name], CMA; 03/30/2019 10:50 AM) Naproxen *ANALGESICS - ANTI-INFLAMMATORY* Allergies Reconciled  Medication History [name], CMA; 03/30/2019 10:50 AM) Tamsulosin HCl (0.4MG  Capsule, Oral) Active. Atorvastatin Calcium (10MG  Tablet, Oral) Active. glipiZIDE ER (10MG  Tablet ER 24HR, Oral) Active. hydroCHLOROthiazide (25MG  Tablet, Oral) Active. Jardiance (25MG  Tablet, Oral) Active. Losartan Potassium (100MG  Tablet, Oral) Active. metFORMIN HCl ER (500MG  Tablet ER 24HR, Oral) Active. Alprostadil (500MCG/ML Solution, Injection) Active. tiZANidine HCl (4MG  Tablet, Oral) Active. Nortriptyline HCl (50MG  Capsule, Oral) Active. Aspirin (81MG  Tablet, Oral) Active. oxyCODONE HCl (5MG  Tablet, Oral) Active. Medications Reconciled  Social History Ethan Ellis. Jakaya Jacobowitz, MD; 03/30/2019 11:57 AM) Alcohol use Occasional alcohol use. Caffeine use Carbonated beverages, Coffee, Tea. Tobacco use Current every day smoker.  Family History Ethan Ellis. Dink Creps, MD; 03/30/2019 11:57 AM) Arthritis Father. Cancer Mother. Diabetes Mellitus Brother, Father, Mother, Sister.  Other Problems Ethan Ellis. Aniyia Rane, MD; 03/30/2019 11:57 AM) Arthritis Back Pain Diabetes Mellitus High blood pressure Hypercholesterolemia    Vitals [name] CMA; 03/30/2019 10:50 AM) 03/30/2019 10:49 AM Weight: 202.8 lb Height: 69in Body Surface Area: 2.08 m Body Mass Index: 29.95 kg/m  Temp.: 98.25F  Pulse: 116 (Regular)  BP: 158/86 (Sitting, Left Arm, Standard)        Physical Exam [name] K. Soriya Worster MD; 03/30/2019 11:57 AM)  The physical exam  findings  are as follows: Note:WDWN in NAD Eyes: Pupils equal, round; sclera anicteric HENT: Oral mucosa moist; good dentition Neck: No masses palpated, no thyromegaly Lungs: CTA bilaterally; normal respiratory effort CV: Regular rate and rhythm; no murmurs; extremities well-perfused with no edema Abd: +bowel sounds, soft, non-tender, no palpable organomegaly; no palpable hernias GU: bilateral descended testes; no testicular masses. Reducible left inguinal hernia. Left groin - 3 cm pedunculated pigmented skin lesion. Base of penis - 1 cm pedunculated skin lesion Skin: Warm, dry; no sign of jaundice Psychiatric - alert and oriented x 4; calm mood and affect    Assessment & Plan [name] K. Dominique Calvey MD; 03/30/2019 11:13 AM)  INGUINAL HERNIA OF LEFT SIDE WITHOUT OBSTRUCTION OR GANGRENE (K40.90)   SKIN LESION (L98.9) Impression: Left groin 4 cm Base of penis 1 cm  Current Plans Schedule for Surgery - Left inguinal hernia repair with mesh. Excision of skin lesions - left groin and base of penis. The surgical procedure has been discussed with the patient. Potential risks, benefits, alternative treatments, and expected outcomes have been explained. All of the patient's questions at this time have been answered. The likelihood of reaching the patient's treatment goal is good. The patient understand the proposed surgical procedure and wishes to proceed.  Ethan Ellis. [name], MD, [facility] Surgery  General/ Trauma Surgery   03/30/2019 11:58 AM

## 2019-04-06 ENCOUNTER — Encounter: Payer: Self-pay | Admitting: Physical Therapy

## 2019-04-06 ENCOUNTER — Ambulatory Visit (INDEPENDENT_AMBULATORY_CARE_PROVIDER_SITE_OTHER): Payer: Medicare HMO | Admitting: Physical Therapy

## 2019-04-06 ENCOUNTER — Other Ambulatory Visit: Payer: Self-pay

## 2019-04-06 DIAGNOSIS — R293 Abnormal posture: Secondary | ICD-10-CM

## 2019-04-06 DIAGNOSIS — M25512 Pain in left shoulder: Secondary | ICD-10-CM

## 2019-04-06 DIAGNOSIS — R600 Localized edema: Secondary | ICD-10-CM

## 2019-04-06 DIAGNOSIS — M25612 Stiffness of left shoulder, not elsewhere classified: Secondary | ICD-10-CM

## 2019-04-06 DIAGNOSIS — M6281 Muscle weakness (generalized): Secondary | ICD-10-CM

## 2019-04-06 NOTE — Therapy (Addendum)
[facility] Physical Therapy [address] [place], [place], [address] Phone: [phone]   Fax:  [phone]  Physical Therapy Treatment/Discharge Summary  Patient Details  Name: Ethan Ellis MRN: [id number] Date of Birth: 12/30/1960 Referring Provider (PT): Mcarthur Rossetti, MD   Encounter Date: 04/06/2019  PT End of Session - 04/06/19 1000    Visit Number  3    Number of Visits  24    Date for PT Re-Evaluation  05/15/19    PT Start Time  0930    PT Stop Time  1003   declined modalities   PT Time Calculation (min)  33 min    Activity Tolerance  Patient tolerated treatment well    Behavior During Therapy  [facility] for tasks assessed/performed       Past Medical History:  Diagnosis Date  . Depression   . Diabetes mellitus without complication ([place])    type 2  . History of kidney stones    passed stone - no surgery  . Hyperlipidemia 03/10/2014  . Hypertension   . LBP (low back pain) 2009   Right  . Unspecified viral hepatitis C without hepatic coma 04/04/2015    Past Surgical History:  Procedure Laterality Date  . APPENDECTOMY    . BACK SURGERY     lower back   . SHOULDER SURGERY     right - x 3 surgery    There were no vitals filed for this visit.  Subjective Assessment - 04/06/19 0932    Subjective  no pain in shoulder, doing well.    Patient Stated Goals  full range, no pain and improvement in function    Currently in Pain?  No/denies    Pain Onset  1 to 4 weeks ago         [facility] PT Assessment - 04/06/19 0937      Assessment   Medical Diagnosis  Z98.890 (ICD-10-CM) - Status post left rotator cuff repair    Referring Provider (PT)  Mcarthur Rossetti, MD    Onset Date/Surgical Date  03/01/19    Hand Dominance  Right    Next MD Visit  04/09/19      PROM   Left Shoulder Flexion  160 Degrees    Left Shoulder ABduction  180 Degrees    Left Shoulder Internal Rotation  82 Degrees    Left Shoulder External Rotation  80  Degrees                   [facility] Adult PT Treatment/Exercise - 04/06/19 0001      Shoulder Exercises: Supine   Other Supine Exercises  scap retraction 20 x 5 sec      Shoulder Exercises: Seated   Retraction  Both;20 reps    Other Seated Exercises  shoulder rolls backwards x 20 reps    Other Seated Exercises  scapular depression 20 x 5 sec      Manual Therapy   Manual Therapy  Passive ROM    Passive ROM  Lt shoulder flexion/abduction/er/ir               PT Short Term Goals - 04/06/19 0955      PT SHORT TERM GOAL #1   Title  independent with initial HEP    Status  Achieved    Target Date  04/17/19      PT SHORT TERM GOAL #2   Title  Lt shoulder PROM improved by 15 deg all planes for improved motion  Status  Achieved    Target Date  04/17/19        PT Long Term Goals - 03/20/19 1256      PT LONG TERM GOAL #1   Title  independent with HEP    Status  New    Target Date  06/12/19      PT LONG TERM GOAL #2   Title  improve Lt shoulder AROM to [facility] for improved function    Status  New    Target Date  06/12/19      PT LONG TERM GOAL #3   Title  demonstrate at least 4/5 Lt shoulder strength for improved function    Status  New    Target Date  06/12/19      PT LONG TERM GOAL #4   Title  report pain < 3/10 with activity for improved function    Status  New    Target Date  06/12/19            Plan - 04/06/19 0955    Clinical Impression Statement  Pt has met STGs today and PROM near normal limits.  Pt to see MD next week and hope to progress to AA/AROM exercises as able.  He is concerned about finances, so may need to decr freq and increase HEP for home.  Will continue to benefit from PT to maximize function.    Personal Factors and Comorbidities  Comorbidity 3+    Comorbidities  depression, DM, HTN, LBP, hep C    Examination-Activity Limitations  Bathing;Lift;Reach Overhead;Carry;Hygiene/Grooming;Dressing    Examination-Participation  Restrictions  Community Activity    Stability/Clinical Decision Making  Evolving/Moderate complexity    Rehab Potential  Good    PT Frequency  2x / week    PT Duration  12 weeks    PT Treatment/Interventions  ADLs/Self Care Home Management;Cryotherapy;Electrical Stimulation;Ultrasound;Moist Heat;Functional mobility training;Therapeutic activities;Therapeutic exercise;Patient/family education;Neuromuscular re-education;Manual techniques;Vasopneumatic Device;Taping;Dry needling;Passive range of motion;Scar mobilization    PT Next Visit Plan  review pendulums, PROM; see what MD    PT Home Exercise Plan  Access Code: [facility]       Patient will benefit from skilled therapeutic intervention in order to improve the following deficits and impairments:  Increased fascial restricitons, Pain, Postural dysfunction, Increased edema, Impaired flexibility, Decreased range of motion, Decreased strength, Impaired UE functional use, Increased muscle spasms  Visit Diagnosis: Acute pain of left shoulder  Stiffness of left shoulder, not elsewhere classified  Abnormal posture  Muscle weakness (generalized)  Localized edema     Problem List Patient Active Problem List   Diagnosis Date Noted  . Status post left rotator cuff repair 03/12/2019  . Cholelithiasis 12/06/2018  . Left inguinal hernia 12/06/2018  . Weight loss 12/06/2018  . Adenomatous polyp 12/06/2018  . Depression 12/06/2018  . Urinary urgency 12/06/2018  . Left upper arm pain 11/24/2017  . Cervical radiculopathy 06/09/2017  . Left lateral epicondylitis 05/23/2017  . Lesion of penis 09/14/2016  . Rash 09/14/2016  . Asthma with acute exacerbation 01/27/2016  . Allergic rhinitis 01/27/2016  . Dyspnea 01/02/2016  . Unspecified viral hepatitis C without hepatic coma 04/04/2015  . Hyperlipidemia 03/10/2014  . Uncontrolled hypertension 02/22/2014  . Uncontrolled diabetes mellitus ([place]) 02/22/2014  . Tobacco dependence 12/30/2010  .  Fatigue 12/30/2010  . Encounter for long-term (current) use of other medications 12/25/2010  . Paresthesia 12/08/2010  . FREQUENCY, URINARY 01/29/2009  . Neoplasm of uncertain behavior of skin 04/24/2008  . Cough 12/04/2007  . LOW  BACK PAIN 06/28/2007  . PAIN IN SOFT TISSUES OF LIMB 06/19/2007      Laureen Abrahams, PT, DPT 04/06/19 10:01 AM    [facility] Physical Therapy [address] [place], [place], [address] Phone: [phone]   Fax:  [phone]  Name: Ethan Ellis MRN: [id number] Date of Birth: [birth year]     PHYSICAL THERAPY DISCHARGE SUMMARY  Visits from Start of Care: 3  Current functional level related to goals / functional outcomes: See above   Remaining deficits: See above   Education / Equipment: HEP  Plan: Patient agrees to discharge.  Patient goals were not met. Patient is being discharged due to the patient's request.  ?????     Laureen Abrahams, PT, DPT 04/12/19 11:54 AM  [facility] Physical Therapy [address] [place], [place], [address] Phone: [phone]   Fax:  [phone]

## 2019-04-09 ENCOUNTER — Other Ambulatory Visit: Payer: Self-pay

## 2019-04-09 ENCOUNTER — Ambulatory Visit (INDEPENDENT_AMBULATORY_CARE_PROVIDER_SITE_OTHER): Payer: Medicare HMO | Admitting: Orthopaedic Surgery

## 2019-04-09 ENCOUNTER — Encounter: Payer: Self-pay | Admitting: Orthopaedic Surgery

## 2019-04-09 DIAGNOSIS — Z9889 Other specified postprocedural states: Secondary | ICD-10-CM

## 2019-04-09 NOTE — Progress Notes (Signed)
The patient is about 5 weeks status post a left shoulder arthroscopic rotator cuff repair.  He has been to physical therapy already.  He already wants to stop therapy.  He reports that he is doing well.  On exam he can easily abduct his operative left shoulder with no difficulty.  It is surprising.  His rotation is good as well.  His pain is minimal.  He does state that the physical therapy is just too expensive for him to go through right now.  Overall though, he reports good progress and does feel like he is doing better overall.  I agree with him stopping therapy at this point given the fact that he is so ahead of what most people are after this type of surgery.  We will see him back in 4 weeks for repeat exam.  No x-rays are needed.  All question concerns were answered and addressed.

## 2019-04-10 ENCOUNTER — Encounter: Payer: Medicare HMO | Admitting: Physical Therapy

## 2019-04-12 ENCOUNTER — Encounter: Payer: Medicare Other | Admitting: Physical Therapy

## 2019-04-17 ENCOUNTER — Encounter: Payer: Medicare Other | Admitting: Physical Therapy

## 2019-04-19 ENCOUNTER — Encounter: Payer: Medicare Other | Admitting: Physical Therapy

## 2019-04-19 ENCOUNTER — Encounter (HOSPITAL_BASED_OUTPATIENT_CLINIC_OR_DEPARTMENT_OTHER): Payer: Self-pay | Admitting: Surgery

## 2019-04-19 ENCOUNTER — Other Ambulatory Visit: Payer: Self-pay

## 2019-04-23 ENCOUNTER — Encounter (HOSPITAL_BASED_OUTPATIENT_CLINIC_OR_DEPARTMENT_OTHER)
Admission: RE | Admit: 2019-04-23 | Discharge: 2019-04-23 | Disposition: A | Discharge status: Home / Self Care | Payer: Medicare HMO | Source: Ambulatory Visit | Attending: Surgery | Admitting: Surgery

## 2019-04-23 ENCOUNTER — Other Ambulatory Visit: Payer: Self-pay

## 2019-04-23 ENCOUNTER — Other Ambulatory Visit (HOSPITAL_COMMUNITY)
Admission: RE | Admit: 2019-04-23 | Discharge: 2019-04-23 | Disposition: A | Discharge status: Home / Self Care | Payer: Medicare HMO | Source: Ambulatory Visit | Attending: Surgery | Admitting: Surgery

## 2019-04-23 DIAGNOSIS — Z01810 Encounter for preprocedural cardiovascular examination: Secondary | ICD-10-CM | POA: Insufficient documentation

## 2019-04-23 DIAGNOSIS — Z01812 Encounter for preprocedural laboratory examination: Secondary | ICD-10-CM | POA: Insufficient documentation

## 2019-04-23 DIAGNOSIS — Z7984 Long term (current) use of oral hypoglycemic drugs: Secondary | ICD-10-CM | POA: Diagnosis not present

## 2019-04-23 DIAGNOSIS — E7800 Pure hypercholesterolemia, unspecified: Secondary | ICD-10-CM | POA: Diagnosis not present

## 2019-04-23 DIAGNOSIS — E119 Type 2 diabetes mellitus without complications: Secondary | ICD-10-CM | POA: Diagnosis not present

## 2019-04-23 DIAGNOSIS — Z7982 Long term (current) use of aspirin: Secondary | ICD-10-CM | POA: Diagnosis not present

## 2019-04-23 DIAGNOSIS — F17210 Nicotine dependence, cigarettes, uncomplicated: Secondary | ICD-10-CM | POA: Diagnosis not present

## 2019-04-23 DIAGNOSIS — Z20822 Contact with and (suspected) exposure to covid-19: Secondary | ICD-10-CM | POA: Diagnosis not present

## 2019-04-23 DIAGNOSIS — I10 Essential (primary) hypertension: Secondary | ICD-10-CM | POA: Diagnosis not present

## 2019-04-23 DIAGNOSIS — K4090 Unilateral inguinal hernia, without obstruction or gangrene, not specified as recurrent: Secondary | ICD-10-CM | POA: Diagnosis present

## 2019-04-23 DIAGNOSIS — A630 Anogenital (venereal) warts: Secondary | ICD-10-CM | POA: Diagnosis not present

## 2019-04-23 DIAGNOSIS — Z79899 Other long term (current) drug therapy: Secondary | ICD-10-CM | POA: Diagnosis not present

## 2019-04-23 DIAGNOSIS — F329 Major depressive disorder, single episode, unspecified: Secondary | ICD-10-CM | POA: Diagnosis not present

## 2019-04-23 DIAGNOSIS — J45909 Unspecified asthma, uncomplicated: Secondary | ICD-10-CM | POA: Diagnosis not present

## 2019-04-23 LAB — COMPREHENSIVE METABOLIC PANEL
ALT: 16 U/L (ref 0–44)
AST: 13 U/L — ABNORMAL LOW (ref 15–41)
Albumin: 3.9 g/dL (ref 3.5–5.0)
Alkaline Phosphatase: 54 U/L (ref 38–126)
Anion gap: 12 (ref 5–15)
BUN: 16 mg/dL (ref 8–23)
CO2: 23 mmol/L (ref 22–32)
Calcium: 8.9 mg/dL (ref 8.9–10.3)
Chloride: 101 mmol/L (ref 98–111)
Creatinine, Ser: 0.83 mg/dL (ref 0.61–1.24)
GFR calc Af Amer: >60 mL/min (ref >60)
GFR calc non Af Amer: >60 mL/min (ref >60)
Glucose, Bld: 232 mg/dL — ABNORMAL HIGH (ref 70–99)
Potassium: 4.2 mmol/L (ref 3.5–5.1)
Sodium: 136 mmol/L (ref 135–145)
Total Bilirubin: 0.8 mg/dL (ref 0.3–1.2)
Total Protein: 6.2 g/dL — ABNORMAL LOW (ref 6.5–8.1)

## 2019-04-23 LAB — SARS CORONAVIRUS 2 (TAT 6-24 HRS): SARS Coronavirus 2: NEGATIVE

## 2019-04-23 MED ORDER — ENSURE PRE-SURGERY PO LIQD: 296.0000 mL | Freq: Once | ORAL | Status: DC

## 2019-04-23 NOTE — Progress Notes (Signed)

## 2019-04-24 ENCOUNTER — Encounter: Payer: Medicare Other | Admitting: Physical Therapy

## 2019-04-26 ENCOUNTER — Encounter: Payer: Medicare Other | Admitting: Physical Therapy

## 2019-04-26 ENCOUNTER — Other Ambulatory Visit: Payer: Self-pay

## 2019-04-26 ENCOUNTER — Ambulatory Visit (HOSPITAL_BASED_OUTPATIENT_CLINIC_OR_DEPARTMENT_OTHER): Payer: Medicare HMO | Admitting: Anesthesiology

## 2019-04-26 ENCOUNTER — Ambulatory Visit (HOSPITAL_BASED_OUTPATIENT_CLINIC_OR_DEPARTMENT_OTHER)
Admission: RE | Admit: 2019-04-26 | Discharge: 2019-04-26 | Disposition: A | Discharge status: Home / Self Care | Payer: Medicare HMO | Attending: Surgery | Admitting: Surgery

## 2019-04-26 ENCOUNTER — Encounter (HOSPITAL_BASED_OUTPATIENT_CLINIC_OR_DEPARTMENT_OTHER)
Admission: RE | Disposition: A | Discharge status: Home / Self Care | Payer: Self-pay | Source: Home / Self Care | Attending: Surgery

## 2019-04-26 ENCOUNTER — Encounter (HOSPITAL_BASED_OUTPATIENT_CLINIC_OR_DEPARTMENT_OTHER): Payer: Self-pay | Admitting: Surgery

## 2019-04-26 DIAGNOSIS — Z79899 Other long term (current) drug therapy: Secondary | ICD-10-CM | POA: Insufficient documentation

## 2019-04-26 DIAGNOSIS — K4090 Unilateral inguinal hernia, without obstruction or gangrene, not specified as recurrent: Secondary | ICD-10-CM | POA: Diagnosis not present

## 2019-04-26 DIAGNOSIS — Z7984 Long term (current) use of oral hypoglycemic drugs: Secondary | ICD-10-CM | POA: Insufficient documentation

## 2019-04-26 DIAGNOSIS — A630 Anogenital (venereal) warts: Secondary | ICD-10-CM | POA: Diagnosis not present

## 2019-04-26 DIAGNOSIS — F329 Major depressive disorder, single episode, unspecified: Secondary | ICD-10-CM | POA: Insufficient documentation

## 2019-04-26 DIAGNOSIS — E119 Type 2 diabetes mellitus without complications: Secondary | ICD-10-CM | POA: Diagnosis not present

## 2019-04-26 DIAGNOSIS — F17210 Nicotine dependence, cigarettes, uncomplicated: Secondary | ICD-10-CM | POA: Diagnosis not present

## 2019-04-26 DIAGNOSIS — E7800 Pure hypercholesterolemia, unspecified: Secondary | ICD-10-CM | POA: Insufficient documentation

## 2019-04-26 DIAGNOSIS — J45909 Unspecified asthma, uncomplicated: Secondary | ICD-10-CM | POA: Insufficient documentation

## 2019-04-26 DIAGNOSIS — I10 Essential (primary) hypertension: Secondary | ICD-10-CM | POA: Insufficient documentation

## 2019-04-26 DIAGNOSIS — Z7982 Long term (current) use of aspirin: Secondary | ICD-10-CM | POA: Insufficient documentation

## 2019-04-26 HISTORY — PX: LESION REMOVAL: SHX5196

## 2019-04-26 HISTORY — PX: INGUINAL HERNIA REPAIR: SHX194

## 2019-04-26 LAB — GLUCOSE, CAPILLARY
Glucose-Capillary: 136 mg/dL — ABNORMAL HIGH (ref 70–99)
Glucose-Capillary: 150 mg/dL — ABNORMAL HIGH (ref 70–99)

## 2019-04-26 SURGERY — REPAIR, HERNIA, INGUINAL, ADULT
Anesthesia: General | Site: Groin | Laterality: Left

## 2019-04-26 MED ORDER — OXYCODONE HCL 5 MG PO TABS: 5.0000 mg | ORAL_TABLET | Freq: Once | ORAL | Status: DC | PRN

## 2019-04-26 MED ORDER — SUGAMMADEX SODIUM 200 MG/2ML IV SOLN
INTRAVENOUS | Status: DC | PRN
  Administered 2019-04-26: 200 mg via INTRAVENOUS

## 2019-04-26 MED ORDER — LACTATED RINGERS IV SOLN: INTRAVENOUS | Status: DC

## 2019-04-26 MED ORDER — MIDAZOLAM HCL 2 MG/2ML IJ SOLN
INTRAMUSCULAR | Status: AC
  Filled 2019-04-26: qty 2

## 2019-04-26 MED ORDER — DEXAMETHASONE SODIUM PHOSPHATE 10 MG/ML IJ SOLN
INTRAMUSCULAR | Status: AC
  Filled 2019-04-26: qty 1

## 2019-04-26 MED ORDER — ONDANSETRON HCL 4 MG/2ML IJ SOLN
INTRAMUSCULAR | Status: AC
  Filled 2019-04-26: qty 2

## 2019-04-26 MED ORDER — GABAPENTIN 300 MG PO CAPS
ORAL_CAPSULE | ORAL | Status: AC
  Filled 2019-04-26: qty 1

## 2019-04-26 MED ORDER — CHLORHEXIDINE GLUCONATE CLOTH 2 % EX PADS: 6.0000 | MEDICATED_PAD | Freq: Once | CUTANEOUS | Status: DC

## 2019-04-26 MED ORDER — DEXAMETHASONE SODIUM PHOSPHATE 10 MG/ML IJ SOLN: 4.0000 mg | INTRAMUSCULAR | Status: DC

## 2019-04-26 MED ORDER — PROPOFOL 10 MG/ML IV BOLUS
INTRAVENOUS | Status: DC | PRN
  Administered 2019-04-26: 150 mg via INTRAVENOUS

## 2019-04-26 MED ORDER — PHENYLEPHRINE HCL (PRESSORS) 10 MG/ML IV SOLN
INTRAVENOUS | Status: DC | PRN
  Administered 2019-04-26: 80 ug via INTRAVENOUS

## 2019-04-26 MED ORDER — ROPIVACAINE HCL 5 MG/ML IJ SOLN
INTRAMUSCULAR | Status: DC | PRN
  Administered 2019-04-26: 30 mL via PERINEURAL

## 2019-04-26 MED ORDER — OXYCODONE HCL 5 MG PO TABS: 5.0000 mg | ORAL_TABLET | Freq: Four times a day (QID) | ORAL | 0 refills | Status: DC | PRN

## 2019-04-26 MED ORDER — LIDOCAINE HCL (CARDIAC) PF 100 MG/5ML IV SOSY
PREFILLED_SYRINGE | INTRAVENOUS | Status: DC | PRN
  Administered 2019-04-26: 75 mg via INTRAVENOUS

## 2019-04-26 MED ORDER — ONDANSETRON HCL 4 MG/2ML IJ SOLN
INTRAMUSCULAR | Status: DC | PRN
  Administered 2019-04-26: 4 mg via INTRAVENOUS

## 2019-04-26 MED ORDER — HYDROMORPHONE HCL 1 MG/ML IJ SOLN: 0.2500 mg | INTRAMUSCULAR | Status: DC | PRN

## 2019-04-26 MED ORDER — FENTANYL CITRATE (PF) 100 MCG/2ML IJ SOLN
INTRAMUSCULAR | Status: DC | PRN
  Administered 2019-04-26: 100 ug via INTRAVENOUS

## 2019-04-26 MED ORDER — FENTANYL CITRATE (PF) 100 MCG/2ML IJ SOLN
50.0000 ug | INTRAMUSCULAR | Status: DC | PRN
  Administered 2019-04-26: 100 ug via INTRAVENOUS

## 2019-04-26 MED ORDER — ACETAMINOPHEN 500 MG PO TABS
1000.0000 mg | ORAL_TABLET | ORAL | Status: AC
  Administered 2019-04-26: 08:00:00 1000 mg via ORAL

## 2019-04-26 MED ORDER — OXYCODONE HCL 5 MG/5ML PO SOLN: 5.0000 mg | Freq: Once | ORAL | Status: DC | PRN

## 2019-04-26 MED ORDER — PROPOFOL 10 MG/ML IV BOLUS
INTRAVENOUS | Status: AC
  Filled 2019-04-26: qty 20

## 2019-04-26 MED ORDER — CEFAZOLIN SODIUM-DEXTROSE 2-4 GM/100ML-% IV SOLN
INTRAVENOUS | Status: AC
  Filled 2019-04-26: qty 100

## 2019-04-26 MED ORDER — DEXAMETHASONE SODIUM PHOSPHATE 4 MG/ML IJ SOLN
INTRAMUSCULAR | Status: DC | PRN
  Administered 2019-04-26: 10 mg via INTRAVENOUS

## 2019-04-26 MED ORDER — EPHEDRINE SULFATE 50 MG/ML IJ SOLN
INTRAMUSCULAR | Status: DC | PRN
  Administered 2019-04-26: 15 mg via INTRAVENOUS
  Administered 2019-04-26: 10 mg via INTRAVENOUS

## 2019-04-26 MED ORDER — MIDAZOLAM HCL 5 MG/5ML IJ SOLN
INTRAMUSCULAR | Status: DC | PRN
  Administered 2019-04-26: 2 mg via INTRAVENOUS

## 2019-04-26 MED ORDER — CEFAZOLIN SODIUM-DEXTROSE 2-4 GM/100ML-% IV SOLN
2.0000 g | INTRAVENOUS | Status: AC
  Administered 2019-04-26: 2 g via INTRAVENOUS

## 2019-04-26 MED ORDER — LIDOCAINE 2% (20 MG/ML) 5 ML SYRINGE
INTRAMUSCULAR | Status: AC
  Filled 2019-04-26: qty 5

## 2019-04-26 MED ORDER — MIDAZOLAM HCL 2 MG/2ML IJ SOLN
1.0000 mg | INTRAMUSCULAR | Status: DC | PRN
  Administered 2019-04-26: 2 mg via INTRAVENOUS

## 2019-04-26 MED ORDER — PHENYLEPHRINE HCL-NACL 10-0.9 MG/250ML-% IV SOLN
INTRAVENOUS | Status: DC | PRN
  Administered 2019-04-26: 25 ug/min via INTRAVENOUS

## 2019-04-26 MED ORDER — PROMETHAZINE HCL 25 MG/ML IJ SOLN: 6.2500 mg | INTRAMUSCULAR | Status: DC | PRN

## 2019-04-26 MED ORDER — ROCURONIUM BROMIDE 10 MG/ML (PF) SYRINGE
PREFILLED_SYRINGE | INTRAVENOUS | Status: AC
  Filled 2019-04-26: qty 10

## 2019-04-26 MED ORDER — FENTANYL CITRATE (PF) 100 MCG/2ML IJ SOLN
INTRAMUSCULAR | Status: AC
  Filled 2019-04-26: qty 2

## 2019-04-26 MED ORDER — GABAPENTIN 300 MG PO CAPS
300.0000 mg | ORAL_CAPSULE | ORAL | Status: AC
  Administered 2019-04-26: 08:00:00 300 mg via ORAL

## 2019-04-26 MED ORDER — ACETAMINOPHEN 500 MG PO TABS
ORAL_TABLET | ORAL | Status: AC
  Filled 2019-04-26: qty 2

## 2019-04-26 MED ORDER — ROCURONIUM BROMIDE 100 MG/10ML IV SOLN
INTRAVENOUS | Status: DC | PRN
  Administered 2019-04-26: 10 mg via INTRAVENOUS
  Administered 2019-04-26: 40 mg via INTRAVENOUS

## 2019-04-26 MED ORDER — MEPERIDINE HCL 25 MG/ML IJ SOLN: 6.2500 mg | INTRAMUSCULAR | Status: DC | PRN

## 2019-04-26 MED ORDER — BUPIVACAINE HCL (PF) 0.25 % IJ SOLN
INTRAMUSCULAR | Status: DC | PRN
  Administered 2019-04-26: 16 mL

## 2019-04-26 SURGICAL SUPPLY — 49 items
ADH SKN CLS APL DERMABOND .7 (GAUZE/BANDAGES/DRESSINGS) ×1
APL PRP STRL LF DISP 70% ISPRP (MISCELLANEOUS) ×1
APL SKNCLS STERI-STRIP NONHPOA (GAUZE/BANDAGES/DRESSINGS) ×1
BENZOIN TINCTURE PRP APPL 2/3 (GAUZE/BANDAGES/DRESSINGS) ×3 IMPLANT
BLADE CLIPPER SURG (BLADE) ×2 IMPLANT
BLADE HEX COATED 2.75 (ELECTRODE) ×3 IMPLANT
BLADE SURG 15 STRL LF DISP TIS (BLADE) ×1 IMPLANT
BLADE SURG 15 STRL SS (BLADE) ×3
CHLORAPREP W/TINT 26 (MISCELLANEOUS) ×3 IMPLANT
CLOSURE WOUND 1/2 X4 (GAUZE/BANDAGES/DRESSINGS) ×1
COVER BACK TABLE 60X90IN (DRAPES) ×3 IMPLANT
COVER MAYO STAND STRL (DRAPES) ×3 IMPLANT
DECANTER SPIKE VIAL GLASS SM (MISCELLANEOUS) ×3 IMPLANT
DERMABOND ADVANCED (GAUZE/BANDAGES/DRESSINGS) ×2
DERMABOND ADVANCED .7 DNX12 (GAUZE/BANDAGES/DRESSINGS) IMPLANT
DRAIN PENROSE 1/2X12 LTX STRL (WOUND CARE) ×3 IMPLANT
DRAPE LAPAROTOMY TRNSV 102X78 (DRAPES) ×3 IMPLANT
DRAPE UTILITY XL STRL (DRAPES) ×3 IMPLANT
DRSG TEGADERM 4X4.75 (GAUZE/BANDAGES/DRESSINGS) ×3 IMPLANT
ELECT REM PT RETURN 9FT ADLT (ELECTROSURGICAL) ×3
ELECTRODE REM PT RTRN 9FT ADLT (ELECTROSURGICAL) ×1 IMPLANT
GAUZE SPONGE 4X4 12PLY STRL LF (GAUZE/BANDAGES/DRESSINGS) ×3 IMPLANT
GLOVE BIO SURGEON STRL SZ7 (GLOVE) ×3 IMPLANT
GLOVE BIOGEL PI IND STRL 7.0 (GLOVE) IMPLANT
GLOVE BIOGEL PI IND STRL 7.5 (GLOVE) ×1 IMPLANT
GLOVE BIOGEL PI INDICATOR 7.0 (GLOVE) ×2
GLOVE BIOGEL PI INDICATOR 7.5 (GLOVE) ×4
GLOVE SURG SS PI 7.5 STRL IVOR (GLOVE) ×2 IMPLANT
GOWN STRL REUS W/ TWL LRG LVL3 (GOWN DISPOSABLE) ×2 IMPLANT
GOWN STRL REUS W/TWL LRG LVL3 (GOWN DISPOSABLE) ×6
MESH PARIETEX PROGRIP LEFT (Mesh General) ×2 IMPLANT
NDL HYPO 25X1 1.5 SAFETY (NEEDLE) ×1 IMPLANT
NEEDLE HYPO 25X1 1.5 SAFETY (NEEDLE) ×3 IMPLANT
NS IRRIG 1000ML POUR BTL (IV SOLUTION) ×2 IMPLANT
PACK BASIN DAY SURGERY FS (CUSTOM PROCEDURE TRAY) ×3 IMPLANT
PENCIL SMOKE EVACUATOR (MISCELLANEOUS) ×3 IMPLANT
SLEEVE SCD COMPRESS KNEE MED (MISCELLANEOUS) ×3 IMPLANT
SPONGE INTESTINAL PEANUT (DISPOSABLE) ×3 IMPLANT
SPONGE LAP 18X18 RF (DISPOSABLE) ×2 IMPLANT
STRIP CLOSURE SKIN 1/2X4 (GAUZE/BANDAGES/DRESSINGS) ×2 IMPLANT
SUT MON AB 4-0 PC3 18 (SUTURE) ×5 IMPLANT
SUT VIC AB 0 CT1 27 (SUTURE) ×6
SUT VIC AB 0 CT1 27XBRD ANBCTR (SUTURE) IMPLANT
SUT VIC AB 2-0 SH 27 (SUTURE) ×3
SUT VIC AB 2-0 SH 27XBRD (SUTURE) ×1 IMPLANT
SUT VIC AB 3-0 SH 27 (SUTURE) ×3
SUT VIC AB 3-0 SH 27X BRD (SUTURE) ×1 IMPLANT
SYR CONTROL 10ML LL (SYRINGE) ×3 IMPLANT
TOWEL GREEN STERILE FF (TOWEL DISPOSABLE) ×3 IMPLANT

## 2019-04-26 NOTE — Anesthesia Procedure Notes (Addendum)
Procedure Name: Intubation Date/Time: 04/26/2019 9:35 AM Performed by: Marrianne Mood, CRNA Pre-anesthesia Checklist: Patient identified, Emergency Drugs available, Suction available, Patient being monitored and Timeout performed Patient Re-evaluated:Patient Re-evaluated prior to induction Oxygen Delivery Method: Circle system utilized Preoxygenation: Pre-oxygenation with 100% oxygen Induction Type: IV induction Ventilation: Mask ventilation without difficulty Laryngoscope Size: Miller and 3 Grade View: Grade III Tube type: Oral Tube size: 8.0 mm Number of attempts: 1 Airway Equipment and Method: Stylet and Oral airway Placement Confirmation: ETT inserted through vocal cords under direct vision,  positive ETCO2 and breath sounds checked- equal and bilateral Secured at: 22 cm Tube secured with: Tape Dental Injury: Teeth and Oropharynx as per pre-operative assessment

## 2019-04-26 NOTE — Interval H&P Note (Signed)
History and Physical Interval Note:  04/26/2019 9:06 AM  Ethan Ellis  has presented today for surgery, with the diagnosis of LEFT INGUINAL HERNIA, SKIN LESION LEFT GROIN AND BASE OF PENIS.  The various methods of treatment have been discussed with the patient and family. After consideration of risks, benefits and other options for treatment, the patient has consented to  Procedure(s) with comments: LEFT INGUINAL HERNIA REPAIR WITH MESH (Left) - LMA AND TAP BLOCK EXCISION OF SKIN LESION LEFT GROIN AND BASE OF PENIS (Left) - LMA AND TAP BLOCK as a surgical intervention.  The patient's history has been reviewed, patient examined, no change in status, stable for surgery.  I have reviewed the patient's chart and labs.  Questions were answered to the patient's satisfaction.     Maia Petties

## 2019-04-26 NOTE — Anesthesia Preprocedure Evaluation (Signed)
Anesthesia Evaluation  Patient identified by MRN, date of birth, ID band Patient awake    Reviewed: Allergy & Precautions, NPO status , Patient's Chart, lab work & pertinent test results  Airway Mallampati: II  TM Distance: >3 FB Neck ROM: Full    Dental no notable dental hx.    Pulmonary asthma , Current Smoker and Patient abstained from smoking.,    Pulmonary exam normal breath sounds clear to auscultation       Cardiovascular hypertension, Pt. on medications negative cardio ROS Normal cardiovascular exam Rhythm:Regular Rate:Normal     Neuro/Psych Depression negative neurological ROS  negative psych ROS   GI/Hepatic negative GI ROS, Neg liver ROS,   Endo/Other  negative endocrine ROSdiabetes  Renal/GU negative Renal ROS  negative genitourinary   Musculoskeletal  (+) Arthritis , Osteoarthritis,    Abdominal (+) + obese,   Peds negative pediatric ROS (+)  Hematology negative hematology ROS (+)   Anesthesia Other Findings   Reproductive/Obstetrics negative OB ROS                             Anesthesia Physical Anesthesia Plan  ASA: III  Anesthesia Plan: General   Post-op Pain Management:  Regional for Post-op pain   Induction: Intravenous  PONV Risk Score and Plan: 1 and Ondansetron and Treatment may vary due to age or medical condition  Airway Management Planned: LMA  Additional Equipment:   Intra-op Plan:   Post-operative Plan: Extubation in OR  Informed Consent: I have reviewed the patients History and Physical, chart, labs and discussed the procedure including the risks, benefits and alternatives for the proposed anesthesia with the patient or authorized representative who has indicated his/her understanding and acceptance.     Dental advisory given  Plan Discussed with: CRNA  Anesthesia Plan Comments:         Anesthesia Quick Evaluation

## 2019-04-26 NOTE — Op Note (Addendum)
Hernia, Open, Procedure Note  Indications: The patient presented with some discomfort and swelling in his left groin.  The hernia has enlarged slightly since June. He denies any obstructive symptoms. He remains reducible. The patient had shoulder surgery in December and is doing well. He presents now to discuss left inguinal hernia repair as well as excision of 2 skin lesions. One is located in his left groin. The other one is much smaller and located at the base of his penis.   Pre-operative Diagnosis: left reducible inguinal hernia; verrucous skin lesion base of penis and left groin Post-operative Diagnosis: same  Surgeon: Maia Petties   Assistants: None  Anesthesia: General LMA anesthesia and TAP block  ASA Class: 2  Procedure Details  The patient was seen again in the Holding Room. The risks, benefits, complications, treatment options, and expected outcomes were discussed with the patient. The possibilities of reaction to medication, pulmonary aspiration, perforation of viscus, bleeding, recurrent infection, the need for additional procedures, and development of a complication requiring transfusion or further operation were discussed with the patient and/or family. The likelihood of success in repairing the hernia and returning the patient to their previous functional status is good.  There was concurrence with the proposed plan, and informed consent was obtained. The site of surgery was properly noted/marked. The patient was taken to the Operating Room, identified as Ethan Ellis, and the procedure verified as left inguinal hernia repair and excision of skin lesion in the left groin and base of penis. A Time Out was held and the above information confirmed.  The patient was placed in the supine position and underwent induction of anesthesia. The lower abdomen and groin was prepped with Chloraprep and draped in the standard fashion, and 0.25% Marcaine with epinephrine was used to  anesthetize the skin over the mid-portion of the inguinal canal. An oblique incision was made. Dissection was carried down through the subcutaneous tissue with cautery to the external oblique fascia.   The external oblique fascia has an obvious defect with some protruding fat.  We opened the external oblique fascia along the direction of its fibers to the external ring.  The spermatic cord was circumferentially dissected bluntly and retracted with a Penrose drain. The floor of the inguinal canal was inspected and revealed a large direct defect.  We skeletonized the spermatic cord and no indirect hernia was identified.  The floor of the inguinal canal was closed with 0 Vicryl.  We used a left Progrip mesh which was inserted and deployed across the floor of the inguinal canal. The mesh was tucked underneath the external oblique fascia laterally.  The flap of the mesh was closed around the spermatic cord to recreate the internal inguinal ring.  The mesh was secured to the pubic tubercle with 0 Vicryl.  The external oblique fascia was reapproximated with 2-0 Vicryl.  3-0 Vicryl was used to close the subcutaneous tissues and 4-0 Monocryl was used to close the skin in subcuticular fashion.  Benzoin and steri-strips were used to seal the incision.  A clean dressing was applied.    We then turned our attention to the skin lesions.  These had been isolated under the drape during the hernia repair.  I excised each of these with full-thickness elliptical incisions.  These were sent separately for pathologic examination.  Hemostasis was obtained with cautery.  The left groin wound was closed with a deep layer of 3-0 Vicryl and a subcuticular layer 4-0 Monocryl.  The base  of the penis wound was closed with 4-0 Monocryl.  Dermabond was used to seal both of these incisions.   The patient was then extubated and brought to the recovery room in stable condition.  All sponge, instrument, and needle counts were correct prior to  closure and at the conclusion of the case.   Estimated Blood Loss: less than 50 mL                 Complications: None; patient tolerated the procedure well.         Disposition: PACU - hemodynamically stable.         Condition: stable  Ethan Ellis. [name], MD, [facility]  General/ Trauma Surgery   04/26/2019 11:07 AM  The left groin skin lesion measured 3 x 2 cm at its base.  The base of the penis skin lesion was 1 cm across.

## 2019-04-26 NOTE — Progress Notes (Signed)
Assisted Dr. Sabra Heck with left, ultrasound guided, transabdominal plane block. Side rails up, monitors on throughout procedure. See vital signs in flow sheet. Tolerated Procedure well.

## 2019-04-26 NOTE — Anesthesia Postprocedure Evaluation (Signed)
Anesthesia Post Note  Patient: PRABHDEEP KIRKER  Procedure(s) Performed: LEFT INGUINAL HERNIA REPAIR WITH MESH (Left Groin) EXCISION OF SKIN LESION LEFT GROIN AND BASE OF PENIS (Left Groin)     Patient location during evaluation: PACU Anesthesia Type: General Level of consciousness: awake and alert Pain management: pain level controlled Vital Signs Assessment: post-procedure vital signs reviewed and stable Respiratory status: spontaneous breathing, nonlabored ventilation and respiratory function stable Cardiovascular status: blood pressure returned to baseline and stable Postop Assessment: no apparent nausea or vomiting Anesthetic complications: no    Last Vitals:  Vitals:   04/26/19 1145 04/26/19 1200  BP:    Pulse:    Resp:    Temp: 36.7 C   SpO2:  98%    Last Pain:  Vitals:   04/26/19 1215  TempSrc:   PainSc: 2                  Lynda Rainwater

## 2019-04-26 NOTE — Transfer of Care (Signed)
Immediate Anesthesia Transfer of Care Note  Patient: RIECE SABINE  Procedure(s) Performed: LEFT INGUINAL HERNIA REPAIR WITH MESH (Left Groin) EXCISION OF SKIN LESION LEFT GROIN AND BASE OF PENIS (Left Groin)  Patient Location: PACU  Anesthesia Type:GA combined with regional for post-op pain  Level of Consciousness: awake and patient cooperative  Airway & Oxygen Therapy: Patient Spontanous Breathing and Patient connected to face mask oxygen  Post-op Assessment: Report given to RN and Post -op Vital signs reviewed and stable  Post vital signs: Reviewed and stable  Last Vitals:  Vitals Value Taken Time  BP 144/79 04/26/19 1056  Temp    Pulse 90 04/26/19 1057  Resp 6 04/26/19 1057  SpO2 95 % 04/26/19 1057  Vitals shown include unvalidated device data.  Last Pain:  Vitals:   04/26/19 0804  TempSrc: Tympanic  PainSc: 0-No pain         Complications: No apparent anesthesia complications

## 2019-04-26 NOTE — Anesthesia Procedure Notes (Signed)
Anesthesia Regional Block: TAP block   Pre-Anesthetic Checklist: ,, timeout performed, Correct Patient, Correct Site, Correct Laterality, Correct Procedure, Correct Position, site marked, Risks and benefits discussed,  Surgical consent,  Pre-op evaluation,  At surgeon's request and post-op pain management  Laterality: Left  Prep: chloraprep       Needles:  Injection technique: Single-shot  Needle Type: Stimiplex     Needle Length: 9cm  Needle Gauge: 21     Additional Needles:   Procedures:,,,, ultrasound used (permanent image in chart),,,,  Narrative:  Start time: 04/26/2019 8:55 AM End time: 04/26/2019 9:00 AM Injection made incrementally with aspirations every 5 mL.  Performed by: Personally  Anesthesiologist: Lynda Rainwater, MD

## 2019-04-26 NOTE — Discharge Instructions (Signed)
[facility] _______[facility]  INGUINAL HERNIA REPAIR: POST OP INSTRUCTIONS  Always review your discharge instruction sheet given to you by the facility where your surgery was performed. IF YOU HAVE DISABILITY OR FAMILY LEAVE FORMS, YOU MUST BRING THEM TO THE OFFICE FOR PROCESSING.   DO NOT GIVE THEM TO YOUR DOCTOR.  1. A  prescription for pain medication may be given to you upon discharge.  Take your pain medication as prescribed, if needed.  If narcotic pain medicine is not needed, then you may take acetaminophen (Tylenol) or ibuprofen (Advil) as needed. 2. Take your usually prescribed medications unless otherwise directed. If you need a refill on your pain medication, please contact your pharmacy.  They will contact our office to request authorization. Prescriptions will not be filled after 5 pm or on week-ends. 3. You should follow a light diet the first 24 hours after arrival home, such as soup and crackers, etc.  Be sure to include lots of fluids daily.  Resume your normal diet the day after surgery. 4.Most patients will experience some swelling and bruising around the umbilicus or in the groin and scrotum.  Ice packs and reclining will help.  Swelling and bruising can take several days to resolve.  6. It is common to experience some constipation if taking pain medication after surgery.  Increasing fluid intake and taking a stool softener (such as Colace) will usually help or prevent this problem from occurring.  A mild laxative (Milk of Magnesia or Miralax) should be taken according to package directions if there are no bowel movements after 48 hours. 7. Unless discharge instructions indicate otherwise, you may remove your bandages 24-48 hours after surgery, and you may shower at that time.  You may have steri-strips (small skin tapes) in place directly over the incision.  These strips should be left on the skin for 7-10 days.  If your surgeon used skin glue on the incision, you may  shower in 24 hours.  The glue will flake off over the next 2-3 weeks.  Any sutures or staples will be removed at the office during your follow-up visit. 8. ACTIVITIES:  You may resume regular (light) daily activities beginning the next day--such as daily self-care, walking, climbing stairs--gradually increasing activities as tolerated.  You may have sexual intercourse when it is comfortable.  Refrain from any heavy lifting or straining until approved by your doctor.  a.You may drive when you are no longer taking prescription pain medication, you can comfortably wear a seatbelt, and you can safely maneuver your car and apply brakes. b.RETURN TO WORK:   _____________________________________________  9.You should see your doctor in the office for a follow-up appointment approximately 2-3 weeks after your surgery.  Make sure that you call for this appointment within a day or two after you arrive home to insure a convenient appointment time. 10.OTHER INSTRUCTIONS: _________________________    _____________________________________  WHEN TO CALL YOUR DOCTOR: 1. Fever over 101.0 2. Inability to urinate 3. Nausea and/or vomiting 4. Extreme swelling or bruising 5. Continued bleeding from incision. 6. Increased pain, redness, or drainage from the incision  The clinic staff is available to answer your questions during regular business hours.  Please don't hesitate to call and ask to speak to one of the nurses for clinical concerns.  If you have a medical emergency, go to the nearest emergency room or call 911.  A surgeon from [facility] is always on call at the hospital   [address]  [address], [place], [place], [place]  [address] ?  [address], [place], [place]   [address] [phone] ? [phone] ? FAX [phone] Web site: [email or web address]     NO TYLENOL PRODUCTS UNTIL  2:10 PM    Post Anesthesia Home Care Instructions  Activity: Get plenty of rest for  the remainder of the day. A responsible individual must stay with you for 24 hours following the procedure.  For the next 24 hours, DO NOT: -Drive a car -Paediatric nurse -Drink alcoholic beverages -Take any medication unless instructed by your physician -Make any legal decisions or sign important papers.  Meals: Start with liquid foods such as gelatin or soup. Progress to regular foods as tolerated. Avoid greasy, spicy, heavy foods. If nausea and/or vomiting occur, drink only clear liquids until the nausea and/or vomiting subsides. Call your physician if vomiting continues.  Special Instructions/Symptoms: Your throat may feel dry or sore from the anesthesia or the breathing tube placed in your throat during surgery. If this causes discomfort, gargle with warm salt water. The discomfort should disappear within 24 hours.  If you had a scopolamine patch placed behind your ear for the management of post- operative nausea and/or vomiting:  1. The medication in the patch is effective for 72 hours, after which it should be removed.  Wrap patch in a tissue and discard in the trash. Wash hands thoroughly with soap and water. 2. You may remove the patch earlier than 72 hours if you experience unpleasant side effects which may include dry mouth, dizziness or visual disturbances. 3. Avoid touching the patch. Wash your hands with soap and water after contact with the patch.

## 2019-04-27 ENCOUNTER — Encounter: Payer: Self-pay | Admitting: *Deleted

## 2019-05-01 ENCOUNTER — Encounter: Payer: Medicare Other | Admitting: Physical Therapy

## 2019-05-01 ENCOUNTER — Telehealth: Payer: Self-pay

## 2019-05-01 LAB — SURGICAL PATHOLOGY

## 2019-05-01 NOTE — Telephone Encounter (Signed)
Patient calling and states that he is needing a copy of his last A1C and his recent medication list. States that he is having a physical done next week and needs this information. Please advise. Would like a call once ready so he can come pick them up.

## 2019-05-02 NOTE — Telephone Encounter (Signed)
left information at front desk for pickup.   Called patient and left voicemail

## 2019-05-03 ENCOUNTER — Encounter: Payer: Medicare Other | Admitting: Physical Therapy

## 2019-05-07 ENCOUNTER — Encounter: Payer: Self-pay | Admitting: Orthopaedic Surgery

## 2019-05-07 ENCOUNTER — Other Ambulatory Visit: Payer: Self-pay

## 2019-05-07 ENCOUNTER — Ambulatory Visit (INDEPENDENT_AMBULATORY_CARE_PROVIDER_SITE_OTHER): Payer: Medicare HMO | Admitting: Orthopaedic Surgery

## 2019-05-07 DIAGNOSIS — Z9889 Other specified postprocedural states: Secondary | ICD-10-CM

## 2019-05-07 NOTE — Progress Notes (Signed)
The patient comes today now 9 weeks status post rotator cuff repair of his left shoulder.  He has not been able to afford to go to physical therapy.  He does report that his range of motion and strength are improving.  He is not try to lift anything heavy yet.  He still gets some pain around his biceps tendon on the left side.  On exam he is actually abducting his shoulder quite nicely on the left side without using his deltoids.  He does have excellent strength in the rotator cuff with abduction as well as external rotation which is also full.  I showed him some exercises to try to work on his strength.  Overall though, he is looking good and is making great progress.  We will see him back for final visit in 4 weeks from now.  If he still having some pain I would consider a steroid injection but only if needed.

## 2019-05-08 ENCOUNTER — Encounter: Payer: Medicare Other | Admitting: Physical Therapy

## 2019-05-10 ENCOUNTER — Encounter: Payer: Medicare Other | Admitting: Physical Therapy

## 2019-05-15 ENCOUNTER — Encounter: Payer: Medicare Other | Admitting: Physical Therapy

## 2019-05-17 ENCOUNTER — Encounter: Payer: Medicare Other | Admitting: Physical Therapy

## 2019-05-22 ENCOUNTER — Encounter: Payer: Medicare Other | Admitting: Physical Therapy

## 2019-05-24 ENCOUNTER — Encounter: Payer: Medicare Other | Admitting: Physical Therapy

## 2019-05-29 ENCOUNTER — Encounter: Payer: Medicare Other | Admitting: Physical Therapy

## 2019-05-31 ENCOUNTER — Encounter: Payer: Medicare Other | Admitting: Physical Therapy

## 2019-06-04 ENCOUNTER — Ambulatory Visit (INDEPENDENT_AMBULATORY_CARE_PROVIDER_SITE_OTHER): Payer: Medicare HMO

## 2019-06-04 ENCOUNTER — Ambulatory Visit (INDEPENDENT_AMBULATORY_CARE_PROVIDER_SITE_OTHER): Payer: Medicare HMO | Admitting: Orthopaedic Surgery

## 2019-06-04 ENCOUNTER — Encounter: Payer: Self-pay | Admitting: Orthopaedic Surgery

## 2019-06-04 ENCOUNTER — Other Ambulatory Visit: Payer: Self-pay

## 2019-06-04 DIAGNOSIS — M79645 Pain in left finger(s): Secondary | ICD-10-CM

## 2019-06-04 DIAGNOSIS — G8929 Other chronic pain: Secondary | ICD-10-CM | POA: Diagnosis not present

## 2019-06-04 DIAGNOSIS — M25532 Pain in left wrist: Secondary | ICD-10-CM

## 2019-06-04 MED ORDER — METHYLPREDNISOLONE ACETATE 40 MG/ML IJ SUSP
40.0000 mg | INTRAMUSCULAR | Status: AC | PRN
  Administered 2019-06-04: 40 mg

## 2019-06-04 MED ORDER — LIDOCAINE HCL 1 % IJ SOLN
1.0000 mL | INTRAMUSCULAR | Status: AC | PRN
  Administered 2019-06-04: 1 mL

## 2019-06-04 NOTE — Progress Notes (Signed)
Office Visit Note   Patient: Ethan Ellis           Date of Birth: 17-Jan-1961           MRN: [id number] Visit Date: 06/04/2019              Requested by: Biagio Borg, MD [address] [place],  [place] [address] PCP: Biagio Borg, MD   Assessment & Plan: Visit Diagnoses:  1. Pain in left wrist   2. Chronic pain of left thumb     Plan: Based on his x-ray findings of his basilar thumb joint on the left and the wrist joint I am recommending a steroid injection around the base of the thumb as well as a separate steroid injection in the left wrist joint.  He agrees with this treatment plan.  I also recommended Voltaren gel as a topical anti-inflammatory over-the-counter to try.  I gave him information for this as well.  I explained the risk and benefits of steroid injections.  He tolerated them well.  All questions and concerns were answered and addressed.  Follow-up can be as needed.  Follow-Up Instructions: Return if symptoms worsen or fail to improve.   Orders:  Orders Placed This Encounter  Procedures  . Hand/UE Inj  . Medium Joint Inj  . XR Wrist Complete Left   No orders of the defined types were placed in this encounter.     Procedures: Hand/UE Inj: L thumb CMC for osteoarthritis on 06/04/2019 3:52 PM Medications: 1 mL lidocaine 1 %; 40 mg methylPREDNISolone acetate 40 MG/ML  Medium Joint Inj: L radiocarpal on 06/04/2019 3:53 PM      Clinical Data: No additional findings.   Subjective: Chief Complaint  Patient presents with  . Left Shoulder - Follow-up  The patient is over 3 months out from a left shoulder arthroscopy with a rotator cuff repair.  He said the left shoulder is doing great at this point.  He has no issues with it other than some slight stiffness.  He is complaining more of left wrist and left thumb pain he points to the basilar thumb joint a source of his pain.  He actually has a remote history of a fracture to his wrist many years ago.  He  denies any numbness and tingling in his hand.  He says that the wrist and thumb pain has only been present since surgery on his left shoulder.  He is also a smoker.  He is right-hand dominant.  HPI  Review of Systems He currently denies any headache, chest pain, shortness of breath, fever, chills, nausea, vomiting  Objective: Vital Signs: There were no vitals taken for this visit.  Physical Exam He is alert and orient x3 and in no acute distress Ortho Exam Examination of his left shoulder shows he has 5 out of 5 strength of the rotator cuff with abduction and external rotation.  His range of motion is smooth and full with the left shoulder.  Examination of the left wrist and thumb shows full range of motion at the wrist.  There is also full range of motion of the left thumb but there is definitely positive grind test at the basilar thumb joint.  There is deep pain in the wrist with the extremes of dorsiflexion and palmar flexion.  There is a prominent ulnar styloid as well. Specialty Comments:  No specialty comments available.  Imaging: XR Wrist Complete Left  Result Date: 06/04/2019 3 views of  the left wrist show no acute findings.  There is evidence of chronic fracture of the radial styloid and ulnar styloid.  There is also arthritis at the base of the left thumb metacarpal with CMC joint arthritis that is mild to moderate.    PMFS History: Patient Active Problem List   Diagnosis Date Noted  . Status post left rotator cuff repair 03/12/2019  . Cholelithiasis 12/06/2018  . Left inguinal hernia 12/06/2018  . Weight loss 12/06/2018  . Adenomatous polyp 12/06/2018  . Depression 12/06/2018  . Urinary urgency 12/06/2018  . Left upper arm pain 11/24/2017  . Cervical radiculopathy 06/09/2017  . Left lateral epicondylitis 05/23/2017  . Lesion of penis 09/14/2016  . Rash 09/14/2016  . Asthma with acute exacerbation 01/27/2016  . Allergic rhinitis 01/27/2016  . Dyspnea 01/02/2016  .  Unspecified viral hepatitis C without hepatic coma 04/04/2015  . Hyperlipidemia 03/10/2014  . Uncontrolled hypertension 02/22/2014  . Uncontrolled diabetes mellitus ([facility]) 02/22/2014  . Tobacco dependence 12/30/2010  . Fatigue 12/30/2010  . Encounter for long-term (current) use of other medications 12/25/2010  . Paresthesia 12/08/2010  . FREQUENCY, URINARY 01/29/2009  . Neoplasm of uncertain behavior of skin 04/24/2008  . Cough 12/04/2007  . LOW BACK PAIN 06/28/2007  . PAIN IN SOFT TISSUES OF LIMB 06/19/2007   Past Medical History:  Diagnosis Date  . Depression   . Diabetes mellitus without complication ([facility])    type 2  . History of kidney stones    passed stone - no surgery  . Hyperlipidemia 03/10/2014  . Hypertension   . LBP (low back pain) 2009   Right  . Unspecified viral hepatitis C without hepatic coma 04/04/2015    Family History  Problem Relation Age of Onset  . Diabetes Father   . Colon cancer Neg Hx   . Colon polyps Neg Hx   . Rectal cancer Neg Hx   . Stomach cancer Neg Hx     Past Surgical History:  Procedure Laterality Date  . APPENDECTOMY    . BACK SURGERY     lower back   . INGUINAL HERNIA REPAIR Left 04/26/2019   Procedure: LEFT INGUINAL HERNIA REPAIR WITH MESH;  Surgeon: Donnie Mesa, MD;  Location: [facility];  Service: General;  Laterality: Left;  LMA AND TAP BLOCK  . LESION REMOVAL Left 04/26/2019   Procedure: EXCISION OF SKIN LESION LEFT GROIN AND BASE OF PENIS;  Surgeon: Donnie Mesa, MD;  Location: [facility];  Service: General;  Laterality: Left;  . SHOULDER SURGERY     right - x 3 surgery   Social History   Occupational History  . Not on file  Tobacco Use  . Smoking status: Current Every Day Smoker    Packs/day: 0.50    Years: 40.00    Pack years: 20.00    Types: Cigarettes  . Smokeless tobacco: Never Used  Substance and Sexual Activity  . Alcohol use: No    Alcohol/week: 0.0 standard drinks  . Drug  use: Not Currently  . Sexual activity: Yes

## 2019-06-06 ENCOUNTER — Ambulatory Visit: Payer: Medicare Other | Admitting: Internal Medicine

## 2019-06-11 ENCOUNTER — Ambulatory Visit (INDEPENDENT_AMBULATORY_CARE_PROVIDER_SITE_OTHER): Payer: Medicare HMO | Admitting: Internal Medicine

## 2019-06-11 ENCOUNTER — Other Ambulatory Visit: Payer: Self-pay

## 2019-06-11 ENCOUNTER — Encounter: Payer: Self-pay | Admitting: Internal Medicine

## 2019-06-11 VITALS — BP 124/72 | HR 96 | Temp 97.1°F | Ht 69.0 in | Wt 201.0 lb

## 2019-06-11 DIAGNOSIS — E785 Hyperlipidemia, unspecified: Secondary | ICD-10-CM

## 2019-06-11 DIAGNOSIS — I10 Essential (primary) hypertension: Secondary | ICD-10-CM

## 2019-06-11 DIAGNOSIS — E1165 Type 2 diabetes mellitus with hyperglycemia: Secondary | ICD-10-CM | POA: Diagnosis not present

## 2019-06-11 DIAGNOSIS — Z122 Encounter for screening for malignant neoplasm of respiratory organs: Secondary | ICD-10-CM | POA: Insufficient documentation

## 2019-06-11 DIAGNOSIS — G8929 Other chronic pain: Secondary | ICD-10-CM | POA: Insufficient documentation

## 2019-06-11 DIAGNOSIS — M545 Low back pain, unspecified: Secondary | ICD-10-CM | POA: Insufficient documentation

## 2019-06-11 DIAGNOSIS — Z0000 Encounter for general adult medical examination without abnormal findings: Secondary | ICD-10-CM | POA: Insufficient documentation

## 2019-06-11 DIAGNOSIS — E538 Deficiency of other specified B group vitamins: Secondary | ICD-10-CM

## 2019-06-11 DIAGNOSIS — E559 Vitamin D deficiency, unspecified: Secondary | ICD-10-CM

## 2019-06-11 LAB — POCT GLYCOSYLATED HEMOGLOBIN (HGB A1C): Hemoglobin A1C: 7 % — AB (ref 4.0–5.6)

## 2019-06-11 MED ORDER — CITALOPRAM HYDROBROMIDE 20 MG PO TABS: 20.0000 mg | ORAL_TABLET | Freq: Every day | ORAL | 3 refills | Status: DC

## 2019-06-11 MED ORDER — JARDIANCE 25 MG PO TABS: 25.0000 mg | ORAL_TABLET | Freq: Every day | ORAL | 3 refills | Status: DC

## 2019-06-11 MED ORDER — METFORMIN HCL 500 MG PO TABS: ORAL_TABLET | ORAL | 3 refills | Status: DC

## 2019-06-11 MED ORDER — ALBUTEROL SULFATE HFA 108 (90 BASE) MCG/ACT IN AERS: 2.0000 | INHALATION_SPRAY | Freq: Four times a day (QID) | RESPIRATORY_TRACT | 3 refills | Status: DC | PRN

## 2019-06-11 MED ORDER — ATORVASTATIN CALCIUM 10 MG PO TABS: 10.0000 mg | ORAL_TABLET | Freq: Every day | ORAL | 3 refills | Status: DC

## 2019-06-11 MED ORDER — LOSARTAN POTASSIUM 100 MG PO TABS: 100.0000 mg | ORAL_TABLET | Freq: Every day | ORAL | 3 refills | Status: DC

## 2019-06-11 MED ORDER — HYDROCHLOROTHIAZIDE 25 MG PO TABS: 25.0000 mg | ORAL_TABLET | Freq: Every day | ORAL | 3 refills | Status: DC

## 2019-06-11 MED ORDER — GLIPIZIDE ER 10 MG PO TB24: ORAL_TABLET | ORAL | 3 refills | Status: DC

## 2019-06-11 NOTE — Progress Notes (Addendum)
Subjective:    Patient ID: Ethan Ellis, male    DOB: 1960-07-24, 59 y.o.   MRN: [id number]  HPI  Here to f/u; overall doing ok,  Pt denies chest pain, increasing sob or doe, wheezing, orthopnea, PND, increased LE swelling, palpitations, dizziness or syncope.  Pt denies new neurological symptoms such as new headache, or facial or extremity weakness or numbness.  Pt denies polydipsia, polyuria, or low sugar episode.  Pt states overall good compliance with meds, mostly trying to follow appropriate diet, with wt overall stable,  but little exercise however.  BP Readings from Last 3 Encounters:  06/11/19 124/72  04/26/19 (!) 151/72  12/06/18 124/88   Wt Readings from Last 3 Encounters:  06/11/19 201 lb (91.2 kg)  04/26/19 215 lb 2.7 oz (97.6 kg)  12/06/18 180 lb (81.6 kg)  Pt continues to have recurring LBP without change in severity, bowel or bladder change, fever, wt loss,  worsening LE pain/numbness/weakness, gait change or falls.  Now s/p left rot cuf surgury and hernia surgury doing well, but declines back surgury as could not be guaranteed would resolve the pain Past Medical History:  Diagnosis Date  . Depression   . Diabetes mellitus without complication ([facility])    type 2  . History of kidney stones    passed stone - no surgery  . Hyperlipidemia 03/10/2014  . Hypertension   . LBP (low back pain) 2009   Right  . Unspecified viral hepatitis C without hepatic coma 04/04/2015   Past Surgical History:  Procedure Laterality Date  . APPENDECTOMY    . BACK SURGERY     lower back   . INGUINAL HERNIA REPAIR Left 04/26/2019   Procedure: LEFT INGUINAL HERNIA REPAIR WITH MESH;  Surgeon: Donnie Mesa, MD;  Location: [facility];  Service: General;  Laterality: Left;  LMA AND TAP BLOCK  . LESION REMOVAL Left 04/26/2019   Procedure: EXCISION OF SKIN LESION LEFT GROIN AND BASE OF PENIS;  Surgeon: Donnie Mesa, MD;  Location: [facility];  Service: General;   Laterality: Left;  . SHOULDER SURGERY     right - x 3 surgery    reports that he has been smoking cigarettes. He has a 20.00 pack-year smoking history. He has never used smokeless tobacco. He reports previous drug use. He reports that he does not drink alcohol. family history includes Diabetes in his father. Allergies  Allergen Reactions  . Naproxen     REACTION: mouth swelling. He can take advil and some other NSAIDs w/o problems   Current Outpatient Medications on File Prior to Visit  Medication Sig Dispense Refill  . albuterol (PROVENTIL HFA;VENTOLIN HFA) 108 (90 Base) MCG/ACT inhaler Inhale 2 puffs into the lungs every 6 (six) hours as needed for wheezing or shortness of breath. 1 Inhaler 11  . aspirin EC 81 MG tablet Take 1 tablet (81 mg total) by mouth daily. 90 tablet 11  . atorvastatin (LIPITOR) 10 MG tablet Take 1 tablet by mouth once daily 90 tablet 0  . Blood Glucose Monitoring Suppl (ONE TOUCH ULTRA SYSTEM KIT) W/DEVICE KIT Use as directed to check blood sugar.  Diagnosis code E11.9 1 each 0  . citalopram (CELEXA) 20 MG tablet Take 1 tablet (20 mg total) by mouth daily. 90 tablet 3  . clotrimazole-betamethasone (LOTRISONE) cream Use as directed to affected area twice daily as needed 15 g 1  . cyclobenzaprine (FLEXERIL) 5 MG tablet Take 1 tablet (5 mg total) by  mouth 3 (three) times daily as needed for muscle spasms. 30 tablet 1  . glipiZIDE (GLUCOTROL XL) 10 MG 24 hr tablet Take 1 tablet by mouth once daily with breakfast 90 tablet 0  . glucose blood (ONE TOUCH TEST STRIPS) test strip Use as instructed once daily to check blood sugar.  Diagnosis code E11.9 100 each 12  . hydrochlorothiazide (HYDRODIURIL) 25 MG tablet Take 1 tablet by mouth once daily 90 tablet 0  . JARDIANCE 25 MG TABS tablet Take 1 tablet by mouth once daily 90 tablet 1  . Lancets MISC Use as directed once daily to check blood sugar.  Diagnosis code E11.9 100 each 11  . losartan (COZAAR) 100 MG tablet Take 1  tablet by mouth once daily 90 tablet 0  . metFORMIN (GLUCOPHAGE) 500 MG tablet 2 tab by mouth in the AM, and 1 in the PM 270 tablet 3  . nortriptyline (PAMELOR) 50 MG capsule Take 1 capsule (50 mg total) by mouth 3 (three) times daily. 90 capsule 5  . oxyCODONE (OXY IR/ROXICODONE) 5 MG immediate release tablet Take 1 tablet (5 mg total) by mouth every 6 (six) hours as needed for severe pain. 15 tablet 0  . tiZANidine (ZANAFLEX) 4 MG tablet Take 4 mg by mouth 3 (three) times daily.    . Vitamin D, Ergocalciferol, (DRISDOL) 1.25 MG (50000 UT) CAPS capsule Take 1 capsule (50,000 Units total) by mouth every 7 (seven) days. 12 capsule 0   No current facility-administered medications on file prior to visit.   Review of Systems All otherwise neg per pt     Objective:   Physical Exam BP 124/72   Pulse 96   Temp (!) 97.1 F (36.2 C)   Ht 5' 9"  (1.753 m)   Wt 201 lb (91.2 kg)   SpO2 99%   BMI 29.68 kg/m  VS noted,  Constitutional: Pt appears in NAD HENT: Head: NCAT.  Right Ear: External ear normal.  Left Ear: External ear normal.  Eyes: . Pupils are equal, round, and reactive to light. Conjunctivae and EOM are normal Nose: without d/c or deformity Neck: Neck supple. Gross normal ROM Cardiovascular: Normal rate and regular rhythm.   Pulmonary/Chest: Effort normal and breath sounds without rales or wheezing.  Abd:  Soft, NT, ND, + BS, no organomegaly Neurological: Pt is alert. At baseline orientation, motor grossly intact Skin: Skin is warm. No rashes, other new lesions, no LE edema Psychiatric: Pt behavior is normal without agitation  All otherwise neg per pt Lab Results  Component Value Date   WBC 12.4 (H) 12/06/2018   HGB 16.9 12/06/2018   HCT 51.2 12/06/2018   PLT 277.0 12/06/2018   GLUCOSE 232 (H) 04/23/2019   CHOL 138 12/06/2018   TRIG 215.0 (H) 12/06/2018   HDL 42.50 12/06/2018   LDLDIRECT 76.0 12/06/2018   ALT 16 04/23/2019   AST 13 (L) 04/23/2019   NA 136 04/23/2019     K 4.2 04/23/2019   CL 101 04/23/2019   CREATININE 0.83 04/23/2019   BUN 16 04/23/2019   CO2 23 04/23/2019   TSH 0.95 12/06/2018   PSA 1.98 12/06/2018   HGBA1C 7.0 (A) 06/11/2019   MICROALBUR 2.8 (H) 11/24/2017   Contains abnormal data POCT HgB A1C Order: [id number] Status:  Final result Visible to patient:  No (inaccessible in MyChart) Dx:  Uncontrolled type 2 diabetes mellitus...  Ref Range & Units 17:07  (06/11/19) 6 mo ago  (12/06/18) 1 yr ago  (05/25/18) 1  yr ago  (11/24/17) 2 yr ago  (05/23/17) 2 yr ago  (11/23/16) 3 yr ago  (09/30/15)  Hemoglobin A1C 4.0 - 5.6 % 7.0Abnormal   6.9High  R, CM  6.8High  R, CM  7.3High  R, CM  8.0High  R, CM  8.6High  R, CM  8.8           Assessment & Plan:

## 2019-06-11 NOTE — Assessment & Plan Note (Signed)
stable overall by history and exam, recent data reviewed with pt, and pt to continue medical treatment as before,  to f/u any worsening symptoms or concerns  

## 2019-06-11 NOTE — Assessment & Plan Note (Addendum)
stable overall by history and exam, recent data reviewed with pt, and pt to continue medical treatment as before,  to f/u any worsening symptoms or concerns  I spent 32 minutes in preparing to see the patient by review of recent labs, imaging and procedures, obtaining and reviewing separately obtained history, communicating with the patient and family or caregiver, ordering medications, tests or procedures, and documenting clinical information in the EHR including the differential Dx, treatment, and any further evaluation and other management of dm, hTN, HLD

## 2019-06-11 NOTE — Patient Instructions (Signed)
.  Your A1c was OK today  Please continue all other medications as before, and refills have been done if requested.  Please have the pharmacy call with any other refills you may need.  Please continue your efforts at being more active, low cholesterol diet, and weight control.  You are otherwise up to date with prevention measures today.  Please keep your appointments with your specialists as you may have planned  Please make an Appointment to return in 6 months, or sooner if needed, also with Lab Appointment for testing done 3-5 days before at the [facility] (so this is for TWO appointments - please see the scheduling desk as you leave)

## 2019-08-14 ENCOUNTER — Other Ambulatory Visit: Payer: Self-pay | Admitting: *Deleted

## 2019-08-14 DIAGNOSIS — F17210 Nicotine dependence, cigarettes, uncomplicated: Secondary | ICD-10-CM

## 2019-08-14 DIAGNOSIS — Z87891 Personal history of nicotine dependence: Secondary | ICD-10-CM

## 2019-09-05 ENCOUNTER — Other Ambulatory Visit: Payer: Self-pay

## 2019-09-05 ENCOUNTER — Ambulatory Visit (INDEPENDENT_AMBULATORY_CARE_PROVIDER_SITE_OTHER): Payer: Medicare HMO | Admitting: Acute Care

## 2019-09-05 ENCOUNTER — Encounter: Payer: Self-pay | Admitting: Acute Care

## 2019-09-05 ENCOUNTER — Ambulatory Visit
Admission: RE | Admit: 2019-09-05 | Discharge: 2019-09-05 | Disposition: A | Discharge status: Home / Self Care | Payer: Medicare HMO | Source: Ambulatory Visit | Attending: Acute Care | Admitting: Acute Care

## 2019-09-05 ENCOUNTER — Telehealth: Payer: Self-pay | Admitting: Acute Care

## 2019-09-05 DIAGNOSIS — Z122 Encounter for screening for malignant neoplasm of respiratory organs: Secondary | ICD-10-CM

## 2019-09-05 DIAGNOSIS — Z87891 Personal history of nicotine dependence: Secondary | ICD-10-CM

## 2019-09-05 DIAGNOSIS — F17210 Nicotine dependence, cigarettes, uncomplicated: Secondary | ICD-10-CM

## 2019-09-05 NOTE — Progress Notes (Signed)
Shared Decision Making Visit Lung Cancer Screening Program [phone])   Eligibility:  Age 59 y.o.  Pack Years Smoking History Calculation 46 pack year smoking history (# packs/per year x # years smoked)  Recent History of coughing up blood  no  Unexplained weight loss? no ( >Than 15 pounds within the last 6 months )  Prior History Lung / other cancer no (Diagnosis within the last 5 years already requiring surveillance chest CT Scans).  Smoking Status Current Smoker  Former Smokers: Years since quit:NA  Quit Date: NA  Visit Components:  Discussion included one or more decision making aids. yes  Discussion included risk/benefits of screening. yes  Discussion included potential follow up diagnostic testing for abnormal scans. yes  Discussion included meaning and risk of over diagnosis. yes  Discussion included meaning and risk of False Positives. yes  Discussion included meaning of total radiation exposure. yes  Counseling Included:  Importance of adherence to annual lung cancer LDCT screening. yes  Impact of comorbidities on ability to participate in the program. yes  Ability and willingness to under diagnostic treatment. yes  Smoking Cessation Counseling:  Current Smokers:   Discussed importance of smoking cessation. yes  Information about tobacco cessation classes and interventions provided to patient. yes  Patient provided with "ticket" for LDCT Scan. yes  Symptomatic Patient. no  Counseling  Diagnosis Code: Tobacco Use Z72.0  Asymptomatic Patient yes  Counseling (Intermediate counseling: > three minutes counseling) I6270  Former Smokers:   Discussed the importance of maintaining cigarette abstinence. yes  Diagnosis Code: Personal History of Nicotine Dependence. J50.093  Information about tobacco cessation classes and interventions provided to patient. Yes  Patient provided with "ticket" for LDCT Scan. yes  Written Order for Lung Cancer  Screening with LDCT placed in Epic. Yes (CT Chest Lung Cancer Screening Low Dose W/O CM) [id number] Z12.2-Screening of respiratory organs Z87.891-Personal history of nicotine dependence  I have spent 25 minutes of face to face time with Mr. Kujawa discussing the risks and benefits of lung cancer screening. We viewed a power point together that explained in detail the above noted topics. We paused at intervals to allow for questions to be asked and answered to ensure understanding.We discussed that the single most powerful action that he can take to decrease his risk of developing lung cancer is to quit smoking. We discussed whether or not he is ready to commit to setting a quit date. We discussed options for tools to aid in quitting smoking including nicotine replacement therapy, non-nicotine medications, support groups, Quit Smart classes, and behavior modification. We discussed that often times setting smaller, more achievable goals, such as eliminating 1 cigarette a day for a week and then 2 cigarettes a day for a week can be helpful in slowly decreasing the number of cigarettes smoked. This allows for a sense of accomplishment as well as providing a clinical benefit. I gave him the " Be Stronger Than Your Excuses" card with contact information for community resources, classes, free nicotine replacement therapy, and access to mobile apps, text messaging, and on-line smoking cessation help. I have also given him my card and contact information in the event he needs to contact me. We discussed the time and location of the scan, and that either Doroteo Glassman RN or I will call with the results within 24-48 hours of receiving them. I have offered him  a copy of the power point we viewed  as a resource in the event they need reinforcement of the  concepts we discussed today in the office. The patient verbalized understanding of all of  the above and had no further questions upon leaving the office. They have my  contact information in the event they have any further questions.  I spent 3 minutes counseling on smoking cessation and the health risks of continued tobacco abuse.  I explained to the patient that there has been a high incidence of coronary artery disease noted on these exams. I explained that this is a non-gated exam therefore degree or severity cannot be determined. This patient is  Currently on statin therapy. I have asked the patient to follow-up with their PCP regarding any incidental finding of coronary artery disease and management with diet or medication as their PCP  feels is clinically indicated. The patient verbalized understanding of the above and had no further questions upon completion of the visit. Pt is not interested in smoking cessation counseling at present.      Magdalen Spatz, NP 09/05/2019 1:59 PM

## 2019-09-05 NOTE — Patient Instructions (Signed)
Thank you for participating in the [facility] Lung Cancer Screening Program. It was our pleasure to meet you today. We will call you with the results of your scan within the next few days. Your scan will be assigned a Lung RADS category score by the physicians reading the scans.  This Lung RADS score determines follow up scanning.  See below for description of categories, and follow up screening recommendations. We will be in touch to schedule your follow up screening annually or based on recommendations of our providers. We will fax a copy of your scan results to your Primary Care Physician, or the physician who referred you to the program, to ensure they have the results. Please call the office if you have any questions or concerns regarding your scanning experience or results.  Our office number is [phone]. Please speak with Denise Phelps, RN. She is our Lung Cancer Screening RN. If she is unavailable when you call, please have the office staff send her a message. She will return your call at her earliest convenience. Remember, if your scan is normal, we will scan you annually as long as you continue to meet the criteria for the program. (Age 55-77, Current smoker or smoker who has quit within the last 15 years). If you are a smoker, remember, quitting is the single most powerful action that you can take to decrease your risk of lung cancer and other pulmonary, breathing related problems. We know quitting is hard, and we are here to help.  Please let us know if there is anything we can do to help you meet your goal of quitting. If you are a former smoker, congratulations. We are proud of you! Remain smoke free! Remember you can refer friends or family members through the number above.  We will screen them to make sure they meet criteria for the program. Thank you for helping us take better care of you by participating in Lung Screening.  Lung RADS Categories:  Lung RADS 1: no nodules  or definitely non-concerning nodules.  Recommendation is for a repeat annual scan in 12 months.  Lung RADS 2:  nodules that are non-concerning in appearance and behavior with a very low likelihood of becoming an active cancer. Recommendation is for a repeat annual scan in 12 months.  Lung RADS 3: nodules that are probably non-concerning , includes nodules with a low likelihood of becoming an active cancer.  Recommendation is for a 6-month repeat screening scan. Often noted after an upper respiratory illness. We will be in touch to make sure you have no questions, and to schedule your 6-month scan.  Lung RADS 4 A: nodules with concerning findings, recommendation is most often for a follow up scan in 3 months or additional testing based on our provider's assessment of the scan. We will be in touch to make sure you have no questions and to schedule the recommended 3 month follow up scan.  Lung RADS 4 B:  indicates findings that are concerning. We will be in touch with you to schedule additional diagnostic testing based on our provider's  assessment of the scan.   

## 2019-09-05 NOTE — Telephone Encounter (Signed)
I called the patient for his scheduled shared decision making visit. There was no answer. I have left a message on his VM  requesting that he call the office so that we can re-schedule his SDMV and his scan. I explained that his scan could not be done without the [facility] being completed first. I will call again in 10 minutes to see if patient answers, If there is no answer I will cancel the scan and the SDMV will need to be rescheduled.

## 2019-09-06 ENCOUNTER — Other Ambulatory Visit: Payer: Self-pay | Admitting: *Deleted

## 2019-09-06 DIAGNOSIS — F17210 Nicotine dependence, cigarettes, uncomplicated: Secondary | ICD-10-CM

## 2019-09-06 DIAGNOSIS — Z87891 Personal history of nicotine dependence: Secondary | ICD-10-CM

## 2019-09-06 NOTE — Progress Notes (Signed)
Please call patient and let them  know their  low dose Ct was read as a Lung RADS 2: nodules that are benign in appearance and behavior with a very low likelihood of becoming a clinically active cancer due to size or lack of growth. Recommendation per radiology is for a repeat LDCT in 12 months. .Please let them  know we will order and schedule their  annual screening scan for 08/2020. Please let them  know there was notation of CAD on their  scan.  Please remind the patient  that this is a non-gated exam therefore degree or severity of disease  cannot be determined. Please have them  follow up with their PCP regarding potential risk factor modification, dietary therapy or pharmacologic therapy if clinically indicated. Pt.  is  currently on statin therapy. Please place order for annual  screening scan for 08/2020 and fax results to PCP. Thanks so much. 

## 2019-12-06 ENCOUNTER — Other Ambulatory Visit: Payer: Medicare HMO

## 2019-12-06 ENCOUNTER — Other Ambulatory Visit: Payer: Self-pay

## 2019-12-06 DIAGNOSIS — E538 Deficiency of other specified B group vitamins: Secondary | ICD-10-CM

## 2019-12-06 DIAGNOSIS — Z0000 Encounter for general adult medical examination without abnormal findings: Secondary | ICD-10-CM

## 2019-12-06 DIAGNOSIS — E559 Vitamin D deficiency, unspecified: Secondary | ICD-10-CM

## 2019-12-06 NOTE — Addendum Note (Signed)
Addended by: Cresenciano Lick on: 12/06/2019 04:12 PM   Modules accepted: Orders

## 2019-12-06 NOTE — Addendum Note (Signed)
Addended by: Cresenciano Lick on: 12/06/2019 04:13 PM   Modules accepted: Orders

## 2019-12-07 LAB — CBC WITH DIFFERENTIAL/PLATELET
Absolute Monocytes: 442 cells/uL (ref 200–950)
Basophils Absolute: 21 cells/uL (ref 0–200)
Basophils Relative: 0.4 %
Eosinophils Absolute: 21 cells/uL (ref 15–500)
Eosinophils Relative: 0.4 %
HCT: 46.6 % (ref 38.5–50.0)
Hemoglobin: 16.2 g/dL (ref 13.2–17.1)
Lymphs Abs: 1680 cells/uL (ref 850–3900)
MCH: 32 pg (ref 27.0–33.0)
MCHC: 34.8 g/dL (ref 32.0–36.0)
MCV: 92.1 fL (ref 80.0–100.0)
MPV: 10.7 fL (ref 7.5–12.5)
Monocytes Relative: 8.5 %
Neutro Abs: 3037 cells/uL (ref 1500–7800)
Neutrophils Relative %: 58.4 %
Platelets: 117 10*3/uL — ABNORMAL LOW (ref 140–400)
RBC: 5.06 10*6/uL (ref 4.20–5.80)
RDW: 13 % (ref 11.0–15.0)
Total Lymphocyte: 32.3 %
WBC: 5.2 10*3/uL (ref 3.8–10.8)

## 2019-12-07 LAB — HEPATIC FUNCTION PANEL
AG Ratio: 1.9 (calc) (ref 1.0–2.5)
ALT: 25 U/L (ref 9–46)
AST: 30 U/L (ref 10–35)
Albumin: 4.1 g/dL (ref 3.6–5.1)
Alkaline phosphatase (APISO): 82 U/L (ref 35–144)
Bilirubin, Direct: 0.3 mg/dL — ABNORMAL HIGH (ref 0.0–0.2)
Globulin: 2.2 g/dL (calc) (ref 1.9–3.7)
Indirect Bilirubin: 0.8 mg/dL (calc) (ref 0.2–1.2)
Total Bilirubin: 1.1 mg/dL (ref 0.2–1.2)
Total Protein: 6.3 g/dL (ref 6.1–8.1)

## 2019-12-07 LAB — PSA: PSA: 1.49 ng/mL (ref <=4.0)

## 2019-12-07 LAB — LIPID PANEL
Cholesterol: 124 mg/dL (ref <200)
HDL: 28 mg/dL — ABNORMAL LOW (ref >=40)
LDL Cholesterol (Calc): 64 mg/dL (calc)
Non-HDL Cholesterol (Calc): 96 mg/dL (calc) (ref <130)
Total CHOL/HDL Ratio: 4.4 (calc) (ref <5.0)
Triglycerides: 276 mg/dL — ABNORMAL HIGH (ref <150)

## 2019-12-07 LAB — URINALYSIS, ROUTINE W REFLEX MICROSCOPIC
Bacteria, UA: NONE SEEN /HPF
Bilirubin Urine: NEGATIVE
Hgb urine dipstick: NEGATIVE
Hyaline Cast: NONE SEEN /LPF
Leukocytes,Ua: NEGATIVE
Nitrite: NEGATIVE
RBC / HPF: NONE SEEN /HPF (ref 0–2)
Specific Gravity, Urine: 1.031 (ref 1.001–1.03)
Squamous Epithelial / HPF: NONE SEEN /HPF (ref <=5)
WBC, UA: NONE SEEN /HPF (ref 0–5)
pH: 6 (ref 5.0–8.0)

## 2019-12-07 LAB — BASIC METABOLIC PANEL
BUN: 21 mg/dL (ref 7–25)
CO2: 29 mmol/L (ref 20–32)
Calcium: 8.8 mg/dL (ref 8.6–10.3)
Chloride: 101 mmol/L (ref 98–110)
Creat: 0.94 mg/dL (ref 0.70–1.25)
Glucose, Bld: 107 mg/dL — ABNORMAL HIGH (ref 65–99)
Potassium: 3.7 mmol/L (ref 3.5–5.3)
Sodium: 139 mmol/L (ref 135–146)

## 2019-12-07 LAB — VITAMIN B12: Vitamin B-12: 319 pg/mL (ref 200–1100)

## 2019-12-07 LAB — VITAMIN D 25 HYDROXY (VIT D DEFICIENCY, FRACTURES): Vit D, 25-Hydroxy: 39 ng/mL (ref 30–100)

## 2019-12-07 LAB — TSH: TSH: 1.51 mIU/L (ref 0.40–4.50)

## 2019-12-12 ENCOUNTER — Ambulatory Visit: Payer: Medicare HMO | Admitting: Internal Medicine

## 2020-01-07 ENCOUNTER — Ambulatory Visit (INDEPENDENT_AMBULATORY_CARE_PROVIDER_SITE_OTHER): Payer: Medicare HMO | Admitting: Internal Medicine

## 2020-01-07 ENCOUNTER — Other Ambulatory Visit: Payer: Self-pay

## 2020-01-07 ENCOUNTER — Encounter: Payer: Self-pay | Admitting: Internal Medicine

## 2020-01-07 VITALS — BP 118/60 | HR 118 | Temp 98.3°F | Ht 69.0 in | Wt 195.0 lb

## 2020-01-07 DIAGNOSIS — U071 COVID-19: Secondary | ICD-10-CM | POA: Insufficient documentation

## 2020-01-07 DIAGNOSIS — F32A Depression, unspecified: Secondary | ICD-10-CM

## 2020-01-07 DIAGNOSIS — F17200 Nicotine dependence, unspecified, uncomplicated: Secondary | ICD-10-CM

## 2020-01-07 DIAGNOSIS — Z0000 Encounter for general adult medical examination without abnormal findings: Secondary | ICD-10-CM | POA: Diagnosis not present

## 2020-01-07 DIAGNOSIS — E1165 Type 2 diabetes mellitus with hyperglycemia: Secondary | ICD-10-CM

## 2020-01-07 DIAGNOSIS — I10 Essential (primary) hypertension: Secondary | ICD-10-CM | POA: Diagnosis not present

## 2020-01-07 DIAGNOSIS — Z122 Encounter for screening for malignant neoplasm of respiratory organs: Secondary | ICD-10-CM

## 2020-01-07 MED ORDER — LOSARTAN POTASSIUM 100 MG PO TABS
100.0000 mg | ORAL_TABLET | Freq: Every day | ORAL | 3 refills | Status: DC
Start: 2020-01-07 — End: 2020-06-23

## 2020-01-07 MED ORDER — HYDROCHLOROTHIAZIDE 25 MG PO TABS
25.0000 mg | ORAL_TABLET | Freq: Every day | ORAL | 3 refills | Status: DC
Start: 2020-01-07 — End: 2020-07-14

## 2020-01-07 MED ORDER — ATORVASTATIN CALCIUM 10 MG PO TABS
10.0000 mg | ORAL_TABLET | Freq: Every day | ORAL | 3 refills | Status: DC
Start: 2020-01-07 — End: 2020-07-14

## 2020-01-07 MED ORDER — ALBUTEROL SULFATE HFA 108 (90 BASE) MCG/ACT IN AERS: 2.0000 | INHALATION_SPRAY | Freq: Four times a day (QID) | RESPIRATORY_TRACT | 3 refills | Status: DC | PRN

## 2020-01-07 MED ORDER — GLIPIZIDE ER 10 MG PO TB24
ORAL_TABLET | ORAL | 3 refills | Status: DC
Start: 2020-01-07 — End: 2020-07-14

## 2020-01-07 MED ORDER — CITALOPRAM HYDROBROMIDE 20 MG PO TABS
20.0000 mg | ORAL_TABLET | Freq: Every day | ORAL | 3 refills | Status: DC
Start: 2020-01-07 — End: 2020-07-14

## 2020-01-07 MED ORDER — METFORMIN HCL 500 MG PO TABS
ORAL_TABLET | ORAL | 3 refills | Status: DC
Start: 2020-01-07 — End: 2020-07-14

## 2020-01-07 MED ORDER — EMPAGLIFLOZIN 25 MG PO TABS
25.0000 mg | ORAL_TABLET | Freq: Every day | ORAL | 3 refills | Status: DC
Start: 2020-01-07 — End: 2020-07-14

## 2020-01-07 MED ORDER — TAMSULOSIN HCL 0.4 MG PO CAPS
0.4000 mg | ORAL_CAPSULE | Freq: Every day | ORAL | 3 refills | Status: DC
Start: 2020-01-07 — End: 2020-07-14

## 2020-01-07 NOTE — Progress Notes (Signed)
Subjective:    Patient ID: Ethan Ellis, male    DOB: January 05, 1961, 59 y.o.   MRN: [id number]  HPI Here for wellness and f/u;  Overall doing ok;  Pt denies Chest pain, worsening SOB, DOE, wheezing, orthopnea, PND, worsening LE edema, palpitations, dizziness or syncope.  Pt denies neurological change such as new headache, facial or extremity weakness.  Pt denies polydipsia, polyuria, or low sugar symptoms. Pt states overall good compliance with treatment and medications, good tolerability, and has been trying to follow appropriate diet.  Pt denies worsening depressive symptoms, suicidal ideation or panic. No fever, night sweats, wt loss, loss of appetite, or other constitutional symptoms.  Pt states good ability with ADL's, has low fall risk, home safety reviewed and adequate, no other significant changes in hearing or vision, and only occasionally active with exercise.  S/p covid infection 9/20.  Still smoking, not ready to quit. Past Medical History:  Diagnosis Date  . Depression   . Diabetes mellitus without complication ([place])    type 2  . History of kidney stones    passed stone - no surgery  . Hyperlipidemia 03/10/2014  . Hypertension   . LBP (low back pain) 2009   Right  . Unspecified viral hepatitis C without hepatic coma 04/04/2015   Past Surgical History:  Procedure Laterality Date  . APPENDECTOMY    . BACK SURGERY     lower back   . INGUINAL HERNIA REPAIR Left 04/26/2019   Procedure: LEFT INGUINAL HERNIA REPAIR WITH MESH;  Surgeon: Donnie Mesa, MD;  Location: [facility];  Service: General;  Laterality: Left;  LMA AND TAP BLOCK  . LESION REMOVAL Left 04/26/2019   Procedure: EXCISION OF SKIN LESION LEFT GROIN AND BASE OF PENIS;  Surgeon: Donnie Mesa, MD;  Location: [facility];  Service: General;  Laterality: Left;  . SHOULDER SURGERY     right - x 3 surgery    reports that he has been smoking cigarettes. He has a 46.00 pack-year smoking history.  He has never used smokeless tobacco. He reports previous drug use. He reports that he does not drink alcohol. family history includes Diabetes in his father. Allergies  Allergen Reactions  . Naproxen     REACTION: mouth swelling. He can take advil and some other NSAIDs w/o problems   Current Outpatient Medications on File Prior to Visit  Medication Sig Dispense Refill  . aspirin EC 81 MG tablet Take 1 tablet (81 mg total) by mouth daily. 90 tablet 11  . Blood Glucose Monitoring Suppl (ONE TOUCH ULTRA SYSTEM KIT) W/DEVICE KIT Use as directed to check blood sugar.  Diagnosis code E11.9 1 each 0  . clotrimazole-betamethasone (LOTRISONE) cream Use as directed to affected area twice daily as needed 15 g 1  . glucose blood (ONE TOUCH TEST STRIPS) test strip Use as instructed once daily to check blood sugar.  Diagnosis code E11.9 100 each 12  . Lancets MISC Use as directed once daily to check blood sugar.  Diagnosis code E11.9 100 each 11  . nortriptyline (PAMELOR) 50 MG capsule Take by mouth.    Marland Kitchen tiZANidine (ZANAFLEX) 4 MG tablet Take 4 mg by mouth 3 (three) times daily.     No current facility-administered medications on file prior to visit.   Review of Systems All otherwise neg per pt     Objective:   Physical Exam BP 118/60 (BP Location: Left Arm, Patient Position: Sitting, Cuff Size: Large)  Pulse (!) 118   Temp 98.3 F (36.8 C) (Oral)   Ht 5' 9"  (1.753 m)   Wt 195 lb (88.5 kg)   SpO2 92%   BMI 28.80 kg/m  VS noted,  Constitutional: Pt appears in NAD HENT: Head: NCAT.  Right Ear: External ear normal.  Left Ear: External ear normal.  Eyes: . Pupils are equal, round, and reactive to light. Conjunctivae and EOM are normal Nose: without d/c or deformity Neck: Neck supple. Gross normal ROM Cardiovascular: Normal rate and regular rhythm.   Pulmonary/Chest: Effort normal and breath sounds without rales or wheezing.  Abd:  Soft, NT, ND, + BS, no organomegaly Neurological: Pt is  alert. At baseline orientation, motor grossly intact Skin: Skin is warm. No rashes, other new lesions, no LE edema Psychiatric: Pt behavior is normal without agitation  All otherwise neg per pt Lab Results  Component Value Date   WBC 5.2 12/06/2019   HGB 16.2 12/06/2019   HCT 46.6 12/06/2019   PLT 117 (L) 12/06/2019   GLUCOSE 107 (H) 12/06/2019   CHOL 124 12/06/2019   TRIG 276 (H) 12/06/2019   HDL 28 (L) 12/06/2019   LDLDIRECT 76.0 12/06/2018   LDLCALC 64 12/06/2019   ALT 25 12/06/2019   AST 30 12/06/2019   NA 139 12/06/2019   K 3.7 12/06/2019   CL 101 12/06/2019   CREATININE 0.94 12/06/2019   BUN 21 12/06/2019   CO2 29 12/06/2019   TSH 1.51 12/06/2019   PSA 1.49 12/06/2019   HGBA1C 7.0 (A) 06/11/2019   MICROALBUR 2.8 (H) 11/24/2017      Assessment & Plan:

## 2020-01-07 NOTE — Patient Instructions (Signed)
Please to have your A1c done at the [facility] lab about Apr 15 2020 for the DOT physical  Please continue all other medications as before, and refills have been done if requested.  Please have the pharmacy call with any other refills you may need.  Please continue your efforts at being more active, low cholesterol diet, and weight control.  You are otherwise up to date with prevention measures today.  Please keep your appointments with your specialists as you may have planned  Please make an Appointment to return in 6 months, or sooner if needed, also with Lab Appointment for testing done 3-5 days before at the [facility] (so this is for TWO appointments - please see the scheduling desk as you leave)

## 2020-01-08 ENCOUNTER — Encounter: Payer: Self-pay | Admitting: Internal Medicine

## 2020-01-08 DIAGNOSIS — Z0001 Encounter for general adult medical examination with abnormal findings: Secondary | ICD-10-CM | POA: Insufficient documentation

## 2020-01-08 DIAGNOSIS — Z0000 Encounter for general adult medical examination without abnormal findings: Secondary | ICD-10-CM | POA: Insufficient documentation

## 2020-01-08 NOTE — Assessment & Plan Note (Signed)
Resolved,  to f/u any worsening symptoms or concerns  

## 2020-01-08 NOTE — Assessment & Plan Note (Signed)
To check a1c prior to DOT physical soon

## 2020-01-08 NOTE — Assessment & Plan Note (Signed)
Counseled to quit 

## 2020-01-08 NOTE — Assessment & Plan Note (Signed)

## 2020-01-08 NOTE — Assessment & Plan Note (Signed)
stable overall by history and exam, recent data reviewed with pt, and pt to continue medical treatment as before,  to f/u any worsening symptoms or concerns  

## 2020-04-09 ENCOUNTER — Ambulatory Visit (INDEPENDENT_AMBULATORY_CARE_PROVIDER_SITE_OTHER): Payer: Medicare HMO

## 2020-04-09 ENCOUNTER — Ambulatory Visit (INDEPENDENT_AMBULATORY_CARE_PROVIDER_SITE_OTHER): Payer: Medicare HMO | Admitting: Orthopaedic Surgery

## 2020-04-09 DIAGNOSIS — M25512 Pain in left shoulder: Secondary | ICD-10-CM | POA: Diagnosis not present

## 2020-04-09 MED ORDER — METHYLPREDNISOLONE ACETATE 40 MG/ML IJ SUSP
40.0000 mg | INTRAMUSCULAR | Status: AC | PRN
  Administered 2020-04-09: 40 mg via INTRA_ARTICULAR

## 2020-04-09 MED ORDER — HYDROCODONE-ACETAMINOPHEN 5-325 MG PO TABS
1.0000 | ORAL_TABLET | Freq: Four times a day (QID) | ORAL | 0 refills | Status: DC | PRN
Start: 2020-04-09 — End: 2020-04-23

## 2020-04-09 MED ORDER — LIDOCAINE HCL 1 % IJ SOLN
3.0000 mL | INTRAMUSCULAR | Status: AC | PRN
  Administered 2020-04-09: 3 mL

## 2020-04-09 NOTE — Progress Notes (Signed)
Office Visit Note   Patient: Ethan Ellis           Date of Birth: 03-17-1960           MRN: [id number] Visit Date: 04/09/2020              Requested by: Biagio Borg, MD [address] [place],  [place] [address] PCP: Biagio Borg, MD   Assessment & Plan: Visit Diagnoses:  1. Acute pain of left shoulder     Plan: Based on the pain that he is having with his left shoulder combined with his physical exam findings, I am concerned that he is torn his rotator cuff on his left shoulder.  I did provide a steroid injection in the subacromial space and counseled him about watching his blood glucose levels closely.  I will send in some hydrocodone for pain.  I would like to reexamine him in 2 weeks.  If he still has significant weakness then a MRI of the left shoulder would be warranted.  Follow-Up Instructions: Return in about 2 weeks (around 04/23/2020).   Orders:  Orders Placed This Encounter  Procedures  . Large Joint Inj  . XR Shoulder Left   Meds ordered this encounter  Medications  . HYDROcodone-acetaminophen (NORCO/VICODIN) 5-325 MG tablet    Sig: Take 1-2 tablets by mouth every 6 (six) hours as needed for moderate pain.    Dispense:  30 tablet    Refill:  0      Procedures: Large Joint Inj: L subacromial bursa on 04/09/2020 4:00 PM Indications: pain and diagnostic evaluation Details: 22 G 1.5 in needle  Arthrogram: No  Medications: 3 mL lidocaine 1 %; 40 mg methylPREDNISolone acetate 40 MG/ML Outcome: tolerated well, no immediate complications Procedure, treatment alternatives, risks and benefits explained, specific risks discussed. Consent was given by the patient. Immediately prior to procedure a time out was called to verify the correct patient, procedure, equipment, support staff and site/side marked as required. Patient was prepped and draped in the usual sterile fashion.       Clinical Data: No additional findings.   Subjective: Chief Complaint   Patient presents with  . Left Shoulder - Pain  The patient is a 60 year old gentleman who is well over a year out from a left shoulder arthroscopic rotator cuff repair.  He had done very well with that shoulder until last week when he slipped and fell on the ice landing hard on his left shoulder.  Since then he has had difficulty with lifting her arm above his head on the left side.  He denies any numbness and tingling in his hand and denies any neck pain.  He is a diabetic but has not checked his blood glucose in several days.  HPI  Review of Systems He currently denies a headache, chest pain, shortness of breath, fever, chills, nausea, vomiting  Objective: Vital Signs: There were no vitals taken for this visit.  Physical Exam He is alert and oriented x3 and in no acute distress but obvious discomfort Ortho Exam Examination of his left shoulder shows it is well located but he has significant weakness with external rotation and abduction of the left shoulder. Specialty Comments:  No specialty comments available.  Imaging: XR Shoulder Left  Result Date: 04/09/2020 3 views of the left shoulder shows it is well located.  There is no acute findings.    PMFS History: Patient Active Problem List   Diagnosis Date Noted  .  Preventative health care 01/08/2020  . COVID-19 virus infection 01/07/2020  . Encounter for screening for lung cancer 06/11/2019  . Chronic low back pain 06/11/2019  . Status post left rotator cuff repair 03/12/2019  . Cholelithiasis 12/06/2018  . Left inguinal hernia 12/06/2018  . Weight loss 12/06/2018  . Adenomatous polyp 12/06/2018  . Depression 12/06/2018  . Urinary urgency 12/06/2018  . Left upper arm pain 11/24/2017  . Cervical radiculopathy 06/09/2017  . Left lateral epicondylitis 05/23/2017  . Lesion of penis 09/14/2016  . Rash 09/14/2016  . Asthma with acute exacerbation 01/27/2016  . Allergic rhinitis 01/27/2016  . Dyspnea 01/02/2016  .  Unspecified viral hepatitis C without hepatic coma 04/04/2015  . Hyperlipidemia 03/10/2014  . Uncontrolled hypertension 02/22/2014  . Uncontrolled diabetes mellitus ([place]) 02/22/2014  . Tobacco dependence 12/30/2010  . Fatigue 12/30/2010  . Encounter for long-term (current) use of other medications 12/25/2010  . Paresthesia 12/08/2010  . FREQUENCY, URINARY 01/29/2009  . Neoplasm of uncertain behavior of skin 04/24/2008  . Cough 12/04/2007  . LOW BACK PAIN 06/28/2007  . PAIN IN SOFT TISSUES OF LIMB 06/19/2007   Past Medical History:  Diagnosis Date  . Depression   . Diabetes mellitus without complication ([place])    type 2  . History of kidney stones    passed stone - no surgery  . Hyperlipidemia 03/10/2014  . Hypertension   . LBP (low back pain) 2009   Right  . Unspecified viral hepatitis C without hepatic coma 04/04/2015    Family History  Problem Relation Age of Onset  . Diabetes Father   . Colon cancer Neg Hx   . Colon polyps Neg Hx   . Rectal cancer Neg Hx   . Stomach cancer Neg Hx     Past Surgical History:  Procedure Laterality Date  . APPENDECTOMY    . BACK SURGERY     lower back   . INGUINAL HERNIA REPAIR Left 04/26/2019   Procedure: LEFT INGUINAL HERNIA REPAIR WITH MESH;  Surgeon: Donnie Mesa, MD;  Location: [facility];  Service: General;  Laterality: Left;  LMA AND TAP BLOCK  . LESION REMOVAL Left 04/26/2019   Procedure: EXCISION OF SKIN LESION LEFT GROIN AND BASE OF PENIS;  Surgeon: Donnie Mesa, MD;  Location: [facility];  Service: General;  Laterality: Left;  . SHOULDER SURGERY     right - x 3 surgery   Social History   Occupational History  . Not on file  Tobacco Use  . Smoking status: Current Every Day Smoker    Packs/day: 1.00    Years: 46.00    Pack years: 46.00    Types: Cigarettes  . Smokeless tobacco: Never Used  Substance and Sexual Activity  . Alcohol use: No    Alcohol/week: 0.0 standard drinks  . Drug  use: Not Currently  . Sexual activity: Yes

## 2020-04-23 ENCOUNTER — Ambulatory Visit (INDEPENDENT_AMBULATORY_CARE_PROVIDER_SITE_OTHER): Payer: Medicare HMO | Admitting: Orthopaedic Surgery

## 2020-04-23 ENCOUNTER — Encounter: Payer: Self-pay | Admitting: Orthopaedic Surgery

## 2020-04-23 DIAGNOSIS — M25512 Pain in left shoulder: Secondary | ICD-10-CM

## 2020-04-23 MED ORDER — HYDROCODONE-ACETAMINOPHEN 5-325 MG PO TABS: 1.0000 | ORAL_TABLET | Freq: Four times a day (QID) | ORAL | 0 refills | Status: DC | PRN

## 2020-04-23 NOTE — Progress Notes (Signed)
HPI: [name] returns today follow-up of his left shoulder.  He was seen 2 weeks ago and was given a subacromial injection.  He states this helps some and his movement has increased but it still hurts to move his shoulder.  Getting someone that underwent a left shoulder rotator cuff repair in December 2020 and has done well until a fall on the ice approximately 3 weeks ago landing on left shoulder.  Review of systems: Please see HPI otherwise negative.  Physical exam: General well-developed well-nourished male in no acute distress. Left shoulder he has weakness with external rotation against resistance.  Unable to perform empty can test secondary to pain and inability of patient to bring his arm up to 90 degrees.  Forward flexion is less than flexion to 90 degrees actively did not push his shoulder beyond 90 degrees today.  Impression: Left shoulder pain  Plan: Due to the fact the patient has decreased range of motion weakness on exam and has failed conservative treatment recommend MRI of the left shoulder rule out rotator cuff tear.  He will follow-up after the MRI to go over results discuss further treatment.  Did refill his hydrocodone is that he is taking this at night mostly for sleep.

## 2020-05-09 ENCOUNTER — Telehealth: Payer: Self-pay | Admitting: Internal Medicine

## 2020-05-09 NOTE — Telephone Encounter (Signed)
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 Phone [phone]

## 2020-05-09 NOTE — Telephone Encounter (Signed)
[facility] office staff sending fax

## 2020-05-11 ENCOUNTER — Ambulatory Visit
Admission: RE | Admit: 2020-05-11 | Discharge: 2020-05-11 | Disposition: A | Discharge status: Home / Self Care | Payer: Medicare HMO | Source: Ambulatory Visit | Attending: Physician Assistant | Admitting: Physician Assistant

## 2020-05-11 ENCOUNTER — Other Ambulatory Visit: Payer: Self-pay

## 2020-05-11 DIAGNOSIS — M75122 Complete rotator cuff tear or rupture of left shoulder, not specified as traumatic: Secondary | ICD-10-CM | POA: Diagnosis not present

## 2020-05-11 DIAGNOSIS — M25512 Pain in left shoulder: Secondary | ICD-10-CM

## 2020-05-11 DIAGNOSIS — R600 Localized edema: Secondary | ICD-10-CM | POA: Diagnosis not present

## 2020-05-14 ENCOUNTER — Ambulatory Visit (INDEPENDENT_AMBULATORY_CARE_PROVIDER_SITE_OTHER): Payer: Medicare HMO | Admitting: Orthopaedic Surgery

## 2020-05-14 ENCOUNTER — Encounter: Payer: Self-pay | Admitting: Orthopaedic Surgery

## 2020-05-14 DIAGNOSIS — M25512 Pain in left shoulder: Secondary | ICD-10-CM

## 2020-05-14 MED ORDER — HYDROCODONE-ACETAMINOPHEN 5-325 MG PO TABS: 1.0000 | ORAL_TABLET | Freq: Four times a day (QID) | ORAL | 0 refills | Status: DC | PRN

## 2020-05-14 NOTE — Progress Notes (Signed)
The patient is a 60 year old gentleman well-known to me.  I see him for many years now.  We last performed arthroscopic surgery on his left shoulder in 2020.  He had a rotator cuff tear then.  He had done well for a long period time but then a few weeks ago had a hard mechanical fall that shoulder.  He had significant pain and weakness and we sent her for an MRI.  He is here for review this today.  He still having significant pain and weakness with his left shoulder.  He has limited abduction and external rotation secondary to pain.  The shoulder is clinically well located.  There is significant weakness of that shoulder and a positive liftoff exam.  MRI of his left shoulder shows significant edema in the left shoulder joint itself.  There is full-thickness tear of the supraspinatus and infraspinatus as well as subscapularis tendons.  There is some arthritic change in the shoulder as well.  He will still try to get his shoulder moving but avoid any heavy lifting of that shoulder.  I would like to send him to my partner Dr. Marlou Sa to evaluate his shoulder because I do feel that the only surgical option may be a shoulder replacement.  I will send in a little bit of pain medication.  All questions and concerns were answered and addressed.  He agrees with this referral.

## 2020-05-23 ENCOUNTER — Ambulatory Visit: Payer: Medicare HMO | Admitting: Orthopedic Surgery

## 2020-05-30 ENCOUNTER — Ambulatory Visit (INDEPENDENT_AMBULATORY_CARE_PROVIDER_SITE_OTHER): Payer: Medicare HMO | Admitting: Surgical

## 2020-05-30 ENCOUNTER — Other Ambulatory Visit: Payer: Self-pay

## 2020-05-30 ENCOUNTER — Encounter: Payer: Self-pay | Admitting: Surgical

## 2020-05-30 DIAGNOSIS — M12812 Other specific arthropathies, not elsewhere classified, left shoulder: Secondary | ICD-10-CM | POA: Diagnosis not present

## 2020-06-02 ENCOUNTER — Other Ambulatory Visit: Payer: Self-pay

## 2020-06-02 DIAGNOSIS — M25512 Pain in left shoulder: Secondary | ICD-10-CM

## 2020-06-06 ENCOUNTER — Telehealth: Payer: Self-pay | Admitting: Internal Medicine

## 2020-06-06 NOTE — Telephone Encounter (Signed)
Ally from the access nurse line called to inform us that the patient had an fainting episode yesterday. He did not hit his head but he is a diabetic and has not checked his blood sugar in a while because his machine does not work. He has been feeling very fatigue today.   Radonna Ricker suggested for him to go to the ED but he would rather be seen in office.

## 2020-06-06 NOTE — Telephone Encounter (Signed)
Needs ED as we are unable to do the required imaging like CT or MRI

## 2020-06-07 ENCOUNTER — Encounter: Payer: Self-pay | Admitting: Surgical

## 2020-06-07 NOTE — Progress Notes (Signed)
Office Visit Note   Patient: Ethan Ellis           Date of Birth: [birth year]           MRN: [id number] Visit Date: 05/30/2020 Requested by: Biagio Borg, MD [address] [place],  [place] [address] PCP: Biagio Borg, MD  Subjective: Chief Complaint  Patient presents with  . Left Shoulder - Pain    HPI: Ethan Ellis is a 60 y.o. male who presents to the office complaining of left shoulder pain.  Patient reports history of surgery to his left shoulder in 2020 by Dr. Ninfa Linden.  He had rotator cuff repair at that time but fell in January 2022 with acute severe pain of the shoulder and weakness of the shoulder as well.  Unable to lift the shoulder as much as he could prior to the fall.  He was evaluated by Dr. Ninfa Linden who obtained MRI scan and referred [facility] for surgical evaluation for the shoulder.  He does have history of diabetes with last A1c 7.0 which was about 12 months ago.  Vitamin D was 39 in September 2021.  Denies taking any blood thinners.              ROS: All systems reviewed are negative as they relate to the chief complaint within the history of present illness.  Patient denies fevers or chills.  Assessment & Plan: Visit Diagnoses:  1. Rotator cuff arthropathy of left shoulder     Plan: Patient is a 60 year old male who presents complaining of left shoulder pain following a fall.  He has history of rotator cuff repair to the left shoulder performed in 2020.  Did well following this procedure until he had a recent fall in January.  He had injection at that time with no lasting relief and was reevaluated by Dr. Ninfa Linden who obtained MRI scan.  He does have difficulty actively forward flexing and ABducting his shoulder above his head on exam today.  This is causing him significant distress and waking him up at night pretty much every night.  Discussed options available to patient and reviewed the MRI images.  He has full-thickness retracted tears of the supraspinatus and  subscapularis as well as the infraspinatus.  Atrophy of the subscapularis is noted on MRI.  There is also edema throughout the muscle bellies of the supraspinatus and infraspinatus with atrophy of the infraspinatus noted on physical exam today.  He also has significant rotator cuff arthropathy with elevation of the humeral head relative to the glenoid.  With all these findings, really the only option surgically for this patient is reverse shoulder arthroplasty.  Tendon transfer not an option given his moderate glenohumeral arthritis.  Discussed the surgery in depth such as how long the surgery is, recovery time, risks and benefits of the procedure.  He understands that pain is a fairly predictive improvement following surgery and it is reasonable to expect his arm to be able to abduct to 90 degrees and forward flex to 90 degrees.  He understands there is a 15-20 pound lifting restriction on that arm for the rest of his life.  Discussed the risks of the procedure including risk of nerve/vessel damage, shoulder instability, shoulder stiffness, medical complication from the procedure, prosthetic joint infection, need for revision surgery.  After lengthy discussion, patient would like to proceed with surgery.  Ordered thin cut CT scan of the left shoulder for preoperative evaluation.  He will follow-up after CT scan  to meet Dr. Marlou Sa and get posted for procedure.  Follow-Up Instructions: No follow-ups on file.   Orders:  No orders of the defined types were placed in this encounter.  No orders of the defined types were placed in this encounter.     Procedures: No procedures performed   Clinical Data: No additional findings.  Objective: Vital Signs: There were no vitals taken for this visit.  Physical Exam:  Constitutional: Patient appears well-developed HEENT:  Head: Normocephalic Eyes:EOM are normal Neck: Normal range of motion Cardiovascular: Normal rate Pulmonary/chest: Effort  normal Neurologic: Patient is alert Skin: Skin is warm Psychiatric: Patient has normal mood and affect  Ortho Exam: Left shoulder with fairly well-preserved range of motion passively.  Lack of adequate active forward flexion/active abduction.  Positive Hornblower sign.  Positive drop arm test.  Crepitus noted with passive motion of the shoulder.  Pain with lifting the arm passively to terminal forward flexion.  Axillary nerve intact with deltoid firing.  Infraspinatus atrophy noted on visual examination.  Specialty Comments:  No specialty comments available.  Imaging: No results found.   PMFS History: Patient Active Problem List   Diagnosis Date Noted  . Preventative health care 01/08/2020  . COVID-19 virus infection 01/07/2020  . Encounter for screening for lung cancer 06/11/2019  . Chronic low back pain 06/11/2019  . Status post left rotator cuff repair 03/12/2019  . Cholelithiasis 12/06/2018  . Left inguinal hernia 12/06/2018  . Weight loss 12/06/2018  . Adenomatous polyp 12/06/2018  . Depression 12/06/2018  . Urinary urgency 12/06/2018  . Left upper arm pain 11/24/2017  . Cervical radiculopathy 06/09/2017  . Left lateral epicondylitis 05/23/2017  . Lesion of penis 09/14/2016  . Rash 09/14/2016  . Asthma with acute exacerbation 01/27/2016  . Allergic rhinitis 01/27/2016  . Dyspnea 01/02/2016  . Unspecified viral hepatitis C without hepatic coma 04/04/2015  . Hyperlipidemia 03/10/2014  . Uncontrolled hypertension 02/22/2014  . Uncontrolled diabetes mellitus ([place]) 02/22/2014  . Tobacco dependence 12/30/2010  . Fatigue 12/30/2010  . Encounter for long-term (current) use of other medications 12/25/2010  . Paresthesia 12/08/2010  . FREQUENCY, URINARY 01/29/2009  . Neoplasm of uncertain behavior of skin 04/24/2008  . Cough 12/04/2007  . LOW BACK PAIN 06/28/2007  . PAIN IN SOFT TISSUES OF LIMB 06/19/2007   Past Medical History:  Diagnosis Date  . Depression   .  Diabetes mellitus without complication ([place])    type 2  . History of kidney stones    passed stone - no surgery  . Hyperlipidemia 03/10/2014  . Hypertension   . LBP (low back pain) 2009   Right  . Unspecified viral hepatitis C without hepatic coma 04/04/2015    Family History  Problem Relation Age of Onset  . Diabetes Father   . Colon cancer Neg Hx   . Colon polyps Neg Hx   . Rectal cancer Neg Hx   . Stomach cancer Neg Hx     Past Surgical History:  Procedure Laterality Date  . APPENDECTOMY    . BACK SURGERY     lower back   . INGUINAL HERNIA REPAIR Left 04/26/2019   Procedure: LEFT INGUINAL HERNIA REPAIR WITH MESH;  Surgeon: Donnie Mesa, MD;  Location: [facility];  Service: General;  Laterality: Left;  LMA AND TAP BLOCK  . LESION REMOVAL Left 04/26/2019   Procedure: EXCISION OF SKIN LESION LEFT GROIN AND BASE OF PENIS;  Surgeon: Donnie Mesa, MD;  Location: [facility];  Service: General;  Laterality: Left;  . SHOULDER SURGERY     right - x 3 surgery   Social History   Occupational History  . Not on file  Tobacco Use  . Smoking status: Current Every Day Smoker    Packs/day: 1.00    Years: 46.00    Pack years: 46.00    Types: Cigarettes  . Smokeless tobacco: Never Used  Substance and Sexual Activity  . Alcohol use: No    Alcohol/week: 0.0 standard drinks  . Drug use: Not Currently  . Sexual activity: Yes

## 2020-06-09 NOTE — Telephone Encounter (Signed)
[facility] Report/Call: ---Caller states yesterday around 830AM when he stood up became dizzy, blacked out and hit the ground, came to and picked himself up. Still feeling disoriented and out of sorts. Unable to check glucose monitor not working and not [employer]. Takes metformin 500 mg 2 in the morning and one at night.  Advised go to ED now.  See messages below, Patient refused.

## 2020-06-12 ENCOUNTER — Telehealth: Payer: Self-pay | Admitting: Internal Medicine

## 2020-06-12 NOTE — Telephone Encounter (Signed)
Patient notified to see what insurance will cover

## 2020-06-12 NOTE — Telephone Encounter (Signed)
Any chance he know which meter is covered with his insurance, as we dont want to guess and have to this several times

## 2020-06-12 NOTE — Telephone Encounter (Signed)
Patient called and said that his glucose meter is broken and he is needing a new one and test strips be sent to [facility], [place]. His last OV was 01/07/20. His next OV is 07/02/20. Please advise.

## 2020-06-13 MED ORDER — ACCU-CHEK GUIDE ME W/DEVICE KIT: PACK | 0 refills | Status: AC

## 2020-06-13 MED ORDER — ACCU-CHEK GUIDE CONTROL VI LIQD: 5 refills | Status: AC

## 2020-06-13 MED ORDER — ACCU-CHEK GUIDE VI STRP: ORAL_STRIP | 12 refills | Status: AC

## 2020-06-13 NOTE — Addendum Note (Signed)
Addended by: Biagio Borg on: 06/13/2020 09:59 PM   Modules accepted: Orders

## 2020-06-13 NOTE — Telephone Encounter (Signed)
Ok this is done 

## 2020-06-13 NOTE — Telephone Encounter (Signed)
Patient called and said that his insurance will cover [employer]. He can be reached at [phone]

## 2020-06-14 ENCOUNTER — Other Ambulatory Visit: Payer: Self-pay

## 2020-06-14 ENCOUNTER — Ambulatory Visit
Admission: RE | Admit: 2020-06-14 | Discharge: 2020-06-14 | Disposition: A | Discharge status: Home / Self Care | Payer: Medicare HMO | Source: Ambulatory Visit | Attending: Orthopedic Surgery | Admitting: Orthopedic Surgery

## 2020-06-14 DIAGNOSIS — M25812 Other specified joint disorders, left shoulder: Secondary | ICD-10-CM | POA: Diagnosis not present

## 2020-06-14 DIAGNOSIS — M25512 Pain in left shoulder: Secondary | ICD-10-CM

## 2020-06-22 ENCOUNTER — Other Ambulatory Visit: Payer: Self-pay | Admitting: Internal Medicine

## 2020-06-22 NOTE — Telephone Encounter (Signed)
Please refill as per office routine med refill policy (all routine meds refilled for 3 mo or monthly per pt preference up to one year from last visit, then month to month grace period for 3 mo, then further med refills will have to be denied)  

## 2020-06-25 ENCOUNTER — Ambulatory Visit: Payer: Medicare HMO | Admitting: Orthopedic Surgery

## 2020-06-25 DIAGNOSIS — M12812 Other specific arthropathies, not elsewhere classified, left shoulder: Secondary | ICD-10-CM | POA: Diagnosis not present

## 2020-06-25 MED ORDER — HYDROCODONE-ACETAMINOPHEN 5-325 MG PO TABS: ORAL_TABLET | ORAL | 0 refills | Status: DC

## 2020-06-28 ENCOUNTER — Encounter: Payer: Self-pay | Admitting: Orthopedic Surgery

## 2020-06-28 NOTE — Progress Notes (Addendum)
Office Visit Note   Patient: Ethan Ellis           Date of Birth: 10/18/1960           MRN: [id number] Visit Date: 06/25/2020 Requested by: Ethan Borg, MD Ethan Ellis,  Ethan Ellis [id number] PCP: Ethan Borg, MD  Subjective: Chief Complaint  Patient presents with   Other    Scan review    HPI: Ethan Ellis is a 60 year old patient with weakness and pain in his left shoulder.  Had rotator cuff surgery in 2020 and had a fall.  Work-up demonstrates recurrent rotator cuff tear retracted with atrophy of multiple rotator cuff muscles.  He has significant weakness and pain with activities of daily living.  He is tried rehabilitative exercises but to no avail.  He is a smoker and has diabetes but his hemoglobin A1c has been below 8.  No one at home.  He has been taking Norco.              ROS: All systems reviewed are negative as they relate to the chief complaint within the history of present illness.  Patient denies  fevers or chills.   Assessment & Plan: Visit Diagnoses: No diagnosis found.  Plan: Impression is left shoulder rotator cuff arthropathy with no real chance that rotator cuff repair.  His only option even though his age is 28 would be reverse shoulder replacement.  Risk and benefits of the procedure discussed with the patient would not limited to infection nerve vessel damage dislocation lifting limit as well as possible need for revision in his lifetime.  Patient understands risk benefits and would like to proceed.  All questions answered.  Follow-Up Instructions: No follow-ups on file.   Orders:  No orders of the defined types were placed in this encounter.  Meds ordered this encounter  Medications   HYDROcodone-acetaminophen (NORCO/VICODIN) 5-325 MG tablet    Sig: 1 po q 8-12 hrs prn pain    Dispense:  30 tablet    Refill:  0      Procedures: No procedures performed   Clinical Data: No additional findings.  Objective: Vital Signs: There were no  vitals taken for this visit.  Physical Exam:   Constitutional: Patient appears well-developed HEENT:  Head: Normocephalic Eyes:EOM are normal Neck: Normal range of motion Cardiovascular: Normal rate Pulmonary/chest: Effort normal Neurologic: Patient is alert Skin: Skin is warm Psychiatric: Patient has normal mood and affect    Ortho Exam: Ortho exam demonstrates external rotation of 15 degrees of abduction to about 45 degrees bilaterally.  Does have pretty significant weakness to infraspinatus and supraspinatus testing at 3-5 on the right compared to 5- out of 5 on the left.  Isolated glenohumeral forward flexion and AB duction just below 90 degrees.  Subscap strength 4- out of 5 on the right 5 out of 5 on the left EPL FPL interosseous strength is intact along with palpable radial pulse on the left-hand side  Specialty Comments:  No specialty comments available.  Imaging: No results found.   PMFS History: Patient Active Problem List   Diagnosis Date Noted   Preventative health care 01/08/2020   COVID-19 virus infection 01/07/2020   Encounter for screening for lung cancer 06/11/2019   Chronic low back pain 06/11/2019   Status post left rotator cuff repair 03/12/2019   Cholelithiasis 12/06/2018   Left inguinal hernia 12/06/2018   Weight loss 12/06/2018   Adenomatous polyp 12/06/2018   Depression 12/06/2018  Urinary urgency 12/06/2018   Left upper arm pain 11/24/2017   Cervical radiculopathy 06/09/2017   Left lateral epicondylitis 05/23/2017   Lesion of penis 09/14/2016   Rash 09/14/2016   Asthma with acute exacerbation 01/27/2016   Allergic rhinitis 01/27/2016   Dyspnea 01/02/2016   Unspecified viral hepatitis C without hepatic coma 04/04/2015   Hyperlipidemia 03/10/2014   Uncontrolled hypertension 02/22/2014   Uncontrolled diabetes mellitus ([place]) 02/22/2014   Tobacco dependence 12/30/2010   Fatigue 12/30/2010   Encounter for long-term (current) use of other  medications 12/25/2010   Paresthesia 12/08/2010   FREQUENCY, URINARY 01/29/2009   Neoplasm of uncertain behavior of skin 04/24/2008   Cough 12/04/2007   LOW BACK PAIN 06/28/2007   PAIN IN SOFT TISSUES OF LIMB 06/19/2007   Past Medical History:  Diagnosis Date   Depression    Diabetes mellitus without complication ([place])    type 2   History of kidney stones    passed stone - no surgery   Hyperlipidemia 03/10/2014   Hypertension    LBP (low back pain) 2009   Right   Unspecified viral hepatitis C without hepatic coma 04/04/2015    Family History  Problem Relation Age of Onset   Diabetes Father    Colon cancer Neg Hx    Colon polyps Neg Hx    Rectal cancer Neg Hx    Stomach cancer Neg Hx     Past Surgical History:  Procedure Laterality Date   APPENDECTOMY     BACK SURGERY     lower back    INGUINAL HERNIA REPAIR Left 04/26/2019   Procedure: LEFT INGUINAL HERNIA REPAIR WITH MESH;  Surgeon: Donnie Mesa, MD;  Location: [facility];  Service: General;  Laterality: Left;  LMA AND TAP BLOCK   LESION REMOVAL Left 04/26/2019   Procedure: EXCISION OF SKIN LESION LEFT GROIN AND BASE OF PENIS;  Surgeon: Donnie Mesa, MD;  Location: [facility];  Service: General;  Laterality: Left;   SHOULDER SURGERY     right - x 3 surgery   Social History   Occupational History   Not on file  Tobacco Use   Smoking status: Current Every Day Smoker    Packs/day: 1.00    Years: 46.00    Pack years: 46.00    Types: Cigarettes   Smokeless tobacco: Never Used  Substance and Sexual Activity   Alcohol use: No    Alcohol/week: 0.0 standard drinks   Drug use: Not Currently   Sexual activity: Yes

## 2020-07-01 ENCOUNTER — Other Ambulatory Visit: Payer: Self-pay

## 2020-07-02 ENCOUNTER — Ambulatory Visit: Payer: Medicare HMO | Admitting: Internal Medicine

## 2020-07-07 ENCOUNTER — Ambulatory Visit: Payer: Medicare HMO | Admitting: Internal Medicine

## 2020-07-12 DIAGNOSIS — S93401A Sprain of unspecified ligament of right ankle, initial encounter: Secondary | ICD-10-CM | POA: Diagnosis not present

## 2020-07-14 ENCOUNTER — Ambulatory Visit (INDEPENDENT_AMBULATORY_CARE_PROVIDER_SITE_OTHER): Payer: Medicare HMO | Admitting: Internal Medicine

## 2020-07-14 ENCOUNTER — Other Ambulatory Visit: Payer: Self-pay

## 2020-07-14 VITALS — BP 132/82 | HR 93 | Temp 98.0°F | Ht 69.0 in | Wt 185.0 lb

## 2020-07-14 DIAGNOSIS — E1165 Type 2 diabetes mellitus with hyperglycemia: Secondary | ICD-10-CM

## 2020-07-14 DIAGNOSIS — E538 Deficiency of other specified B group vitamins: Secondary | ICD-10-CM | POA: Diagnosis not present

## 2020-07-14 DIAGNOSIS — E785 Hyperlipidemia, unspecified: Secondary | ICD-10-CM | POA: Diagnosis not present

## 2020-07-14 DIAGNOSIS — Z0001 Encounter for general adult medical examination with abnormal findings: Secondary | ICD-10-CM

## 2020-07-14 DIAGNOSIS — R55 Syncope and collapse: Secondary | ICD-10-CM | POA: Insufficient documentation

## 2020-07-14 DIAGNOSIS — I10 Essential (primary) hypertension: Secondary | ICD-10-CM | POA: Diagnosis not present

## 2020-07-14 DIAGNOSIS — E559 Vitamin D deficiency, unspecified: Secondary | ICD-10-CM | POA: Diagnosis not present

## 2020-07-14 DIAGNOSIS — Z125 Encounter for screening for malignant neoplasm of prostate: Secondary | ICD-10-CM

## 2020-07-14 MED ORDER — GLIPIZIDE ER 10 MG PO TB24: ORAL_TABLET | ORAL | 3 refills | Status: DC

## 2020-07-14 MED ORDER — LOSARTAN POTASSIUM 100 MG PO TABS: 1.0000 | ORAL_TABLET | Freq: Every day | ORAL | 3 refills | Status: DC

## 2020-07-14 MED ORDER — EMPAGLIFLOZIN 25 MG PO TABS: 25.0000 mg | ORAL_TABLET | Freq: Every day | ORAL | 3 refills | Status: DC

## 2020-07-14 MED ORDER — ALBUTEROL SULFATE HFA 108 (90 BASE) MCG/ACT IN AERS: 2.0000 | INHALATION_SPRAY | Freq: Four times a day (QID) | RESPIRATORY_TRACT | 3 refills | Status: AC | PRN

## 2020-07-14 MED ORDER — CITALOPRAM HYDROBROMIDE 20 MG PO TABS: 20.0000 mg | ORAL_TABLET | Freq: Every day | ORAL | 3 refills | Status: DC

## 2020-07-14 MED ORDER — ATORVASTATIN CALCIUM 10 MG PO TABS: 10.0000 mg | ORAL_TABLET | Freq: Every day | ORAL | 3 refills | Status: DC

## 2020-07-14 MED ORDER — TAMSULOSIN HCL 0.4 MG PO CAPS: 0.4000 mg | ORAL_CAPSULE | Freq: Every day | ORAL | 3 refills | Status: DC

## 2020-07-14 MED ORDER — METFORMIN HCL 500 MG PO TABS: ORAL_TABLET | ORAL | 3 refills | Status: DC

## 2020-07-14 MED ORDER — HYDROCHLOROTHIAZIDE 25 MG PO TABS: 25.0000 mg | ORAL_TABLET | Freq: Every day | ORAL | 3 refills | Status: DC

## 2020-07-14 NOTE — Progress Notes (Addendum)
Patient ID: Ethan Ellis, male   DOB: 01-22-1961, 60 y.o.   MRN: [id number]         Chief Complaint:: wellness exam and Diabetes (Follow up, pt needs refills of citalopram and metformin.)  , syncope, left shoulder surgury, smoking, hld, htn       HPI:  Ethan Ellis is a 60 y.o. male here for wellness exam; pt declines eye exam referral, covid vaccine, and colonoscopy for now; o/w up to date with preventive referrals and immunizations.  Pt is still smoking, not ready to quit.                          Also pt states had a frank syncopal episode 4 days ago which occurred after he was bending forward while standing, then stood up quickly.  Was out for several second to maybe a minute.  Was in the bathroom and sort of fell backwards but denies apparent injury or bruising or bleeding.  Since then, Pt denies chest pain, increased sob or doe, wheezing, orthopnea, PND, increased LE swelling, palpitations,  Pt denies polydipsia, polyuria, or other new focal neuro s/s.  Due for left shoudler surgury soon per Dr Marlou Sa has an office visit appt for preop soon and may ask to have this postponed due to the passing out.   Pt denies fever, wt loss, night sweats, loss of appetite, or other constitutional symptoms  Denies worsening reflux, abd pain, dysphagia, n/v, bowel change or blood.  Denies urinary symptoms such as dysuria, frequency, urgency, flank pain, hematuria or n/v, fever, chills.  No other new complaints   Wt Readings from Last 3 Encounters:  07/14/20 185 lb (83.9 kg)  01/07/20 195 lb (88.5 kg)  06/11/19 201 lb (91.2 kg)   BP Readings from Last 3 Encounters:  07/14/20 132/82  01/07/20 118/60  06/11/19 124/72   Immunization History  Administered Date(s) Administered  . Influenza,inj,Quad PF,6+ Mos 02/22/2014, 04/01/2015, 11/23/2016, 11/24/2017, 12/06/2018  . Influenza-Unspecified 04/15/2016  . Pneumococcal Conjugate-13 04/22/2016  . Pneumococcal Polysaccharide-23 05/04/2016  . Tdap 09/14/2016    There are no preventive care reminders to display for this patient.  Past Medical History:  Diagnosis Date  . Depression   . Diabetes mellitus without complication ([place])    type 2  . History of kidney stones    passed stone - no surgery  . Hyperlipidemia 03/10/2014  . Hypertension   . LBP (low back pain) 2009   Right  . Unspecified viral hepatitis C without hepatic coma 04/04/2015   Past Surgical History:  Procedure Laterality Date  . APPENDECTOMY    . BACK SURGERY     lower back   . INGUINAL HERNIA REPAIR Left 04/26/2019   Procedure: LEFT INGUINAL HERNIA REPAIR WITH MESH;  Surgeon: Donnie Mesa, MD;  Location: [facility];  Service: General;  Laterality: Left;  LMA AND TAP BLOCK  . LESION REMOVAL Left 04/26/2019   Procedure: EXCISION OF SKIN LESION LEFT GROIN AND BASE OF PENIS;  Surgeon: Donnie Mesa, MD;  Location: [facility];  Service: General;  Laterality: Left;  . SHOULDER SURGERY     right - x 3 surgery    reports that he has been smoking cigarettes. He has a 46.00 pack-year smoking history. He has never used smokeless tobacco. He reports previous drug use. He reports that he does not drink alcohol. family history includes Diabetes in his father. Allergies  Allergen Reactions  .  Naproxen     REACTION: mouth swelling. He can take advil and some other NSAIDs w/o problems   Current Outpatient Medications on File Prior to Visit  Medication Sig Dispense Refill  . aspirin EC 81 MG tablet Take 1 tablet (81 mg total) by mouth daily. 90 tablet 11  . Blood Glucose Calibration (ACCU-CHEK GUIDE CONTROL) LIQD Use as directed once daily e11.9 1 each 5  . Blood Glucose Monitoring Suppl (ACCU-CHEK GUIDE ME) w/Device KIT Use as directed once daily E11.9 1 kit 0  . clotrimazole-betamethasone (LOTRISONE) cream Use as directed to affected area twice daily as needed 15 g 1  . glucose blood (ACCU-CHEK GUIDE) test strip Use as instructed once daily E11.9 100  each 12  . HYDROcodone-acetaminophen (NORCO/VICODIN) 5-325 MG tablet 1 po q 8-12 hrs prn pain 30 tablet 0  . Lancets MISC Use as directed once daily to check blood sugar.  Diagnosis code E11.9 100 each 11  . nortriptyline (PAMELOR) 50 MG capsule Take by mouth.    Marland Kitchen tiZANidine (ZANAFLEX) 4 MG tablet Take 4 mg by mouth 3 (three) times daily.     No current facility-administered medications on file prior to visit.        ROS:  All others reviewed and negative.  Objective        PE:  BP 132/82 (BP Location: Right Arm, Patient Position: Sitting, Cuff Size: Normal)   Pulse 93   Temp 98 F (36.7 C) (Oral)   Ht 5' 9"  (1.753 m)   Wt 185 lb (83.9 kg)   SpO2 95%   BMI 27.32 kg/m                 Constitutional: Pt appears in NAD               HENT: Head: NCAT.                Right Ear: External ear normal.                 Left Ear: External ear normal.                Eyes: . Pupils are equal, round, and reactive to light. Conjunctivae and EOM are normal               Nose: without d/c or deformity               Neck: Neck supple. Gross normal ROM               Cardiovascular: Normal rate and regular rhythm.                 Pulmonary/Chest: Effort normal and breath sounds without rales or wheezing.                Abd:  Soft, NT, ND, + BS, no organomegaly               Neurological: Pt is alert. At baseline orientation, motor grossly intact               Skin: Skin is warm. No rashes, no other new lesions, LE edema - none               Psychiatric: Pt behavior is normal without agitation   Micro: none  Cardiac tracings I have personally interpreted today:  ECG - NSR, RBBB 86  Pertinent Radiological findings (summarize): none   Lab Results  Component Value Date  WBC 10.4 07/14/2020   HGB 16.9 07/14/2020   HCT 50.5 07/14/2020   PLT 287.0 07/14/2020   GLUCOSE 103 (H) 07/14/2020   CHOL 164 07/14/2020   TRIG (H) 07/14/2020    439.0 Triglyceride is over 400; calculations on Lipids are  invalid.   HDL 32.80 (L) 07/14/2020   LDLDIRECT 91.0 07/14/2020   LDLCALC 64 12/06/2019   ALT 14 07/14/2020   AST 11 07/14/2020   NA 141 07/14/2020   K 3.7 07/14/2020   CL 100 07/14/2020   CREATININE 0.97 07/14/2020   BUN 21 07/14/2020   CO2 32 07/14/2020   TSH 0.65 07/14/2020   PSA 1.41 07/14/2020   HGBA1C 6.7 (H) 07/14/2020   MICROALBUR 0.8 07/14/2020   Assessment/Plan:  WARWICK NICK is a 60 y.o. White or Caucasian [1] male with  has a past medical history of Depression, Diabetes mellitus without complication ([place]), History of kidney stones, Hyperlipidemia (03/10/2014), Hypertension, LBP (low back pain) (2009), and Unspecified viral hepatitis C without hepatic coma (04/04/2015).  Encounter for well adult exam with abnormal findings Age and sex appropriate education and counseling updated with regular exercise and diet Referrals for preventative services - declines eye exam, colonoscopy Immunizations addressed - declines covid vax Smoking counseling  - counseled to quit, pt not ready Evidence for depression or other mood disorder - none significant Most recent labs reviewed. I have personally reviewed and have noted: 1) the patient's medical and social history 2) The patient's current medications and supplements 3) The patient's height, weight, and BMI have been recorded in the chart   Uncontrolled hypertension Improved,  BP Readings from Last 3 Encounters:  07/14/20 132/82  01/07/20 118/60  06/11/19 124/72   Stable, pt to continue medical treatment losartan, hct   Uncontrolled diabetes mellitus Lab Results  Component Value Date   HGBA1C 6.7 (H) 07/14/2020   Stable, pt to continue current medical treatment jardiance, glucotrol, metformin   Syncope Possible orthostatic, vasovagal or other - declines ecg today, but for echo, carotid, and MRI brain,  to f/u any worsening symptoms or concerns, consider neuro and/or cardiology referral  Hyperlipidemia Lab Results   Component Value Date   [place] 64 12/06/2019   Stable, pt to continue current statin lipitor 10   Vitamin D deficiency Last vitamin D Lab Results  Component Value Date   VD25OH 25.65 (L) 07/14/2020   Low, to start oral replacement   Followup: Return in about 6 months (around 01/14/2021).  Cathlean Cower, MD 07/16/2020 10:08 PM [facility]

## 2020-07-14 NOTE — Patient Instructions (Addendum)
Please stop smoking  Please continue all other medications as before, and refills have been done if requested.  Please have the pharmacy call with any other refills you may need.  Please continue your efforts at being more active, low cholesterol diet, and weight control.  You are otherwise up to date with prevention measures today.  Please keep your appointments with your specialists as you may have planned  You will be contacted regarding the referral for: Head MRI, carotid testing, and echocardiogram  I agree you should postpone the left shoulder surgury for at least 1 month  Please go to the LAB at the blood drawing area for the tests to be done  You will be contacted by phone if any changes need to be made immediately.  Otherwise, you will receive a letter about your results with an explanation, but please check with MyChart first.  Please remember to sign up for MyChart if you have not done so, as this will be important to you in the future with finding out test results, communicating by private email, and scheduling acute appointments online when needed.  Please make an Appointment to return in 6 months, or sooner if needed

## 2020-07-15 ENCOUNTER — Encounter: Payer: Self-pay | Admitting: Internal Medicine

## 2020-07-15 ENCOUNTER — Telehealth: Payer: Self-pay | Admitting: Orthopedic Surgery

## 2020-07-15 LAB — URINALYSIS, ROUTINE W REFLEX MICROSCOPIC
Bilirubin Urine: NEGATIVE
Hgb urine dipstick: NEGATIVE
Leukocytes,Ua: NEGATIVE
Nitrite: NEGATIVE
RBC / HPF: NONE SEEN (ref 0–?)
Specific Gravity, Urine: 1.02 (ref 1.000–1.030)
Total Protein, Urine: NEGATIVE
Urine Glucose: >=1000 — AB
Urobilinogen, UA: 0.2 (ref 0.0–1.0)
WBC, UA: NONE SEEN (ref 0–?)
pH: 6 (ref 5.0–8.0)

## 2020-07-15 LAB — CBC WITH DIFFERENTIAL/PLATELET
Basophils Absolute: 0.1 10*3/uL (ref 0.0–0.1)
Basophils Relative: 0.9 % (ref 0.0–3.0)
Eosinophils Absolute: 0.4 10*3/uL (ref 0.0–0.7)
Eosinophils Relative: 3.9 % (ref 0.0–5.0)
HCT: 50.5 % (ref 39.0–52.0)
Hemoglobin: 16.9 g/dL (ref 13.0–17.0)
Lymphocytes Relative: 25.3 % (ref 12.0–46.0)
Lymphs Abs: 2.6 10*3/uL (ref 0.7–4.0)
MCHC: 33.4 g/dL (ref 30.0–36.0)
MCV: 96.8 fl (ref 78.0–100.0)
Monocytes Absolute: 0.6 10*3/uL (ref 0.1–1.0)
Monocytes Relative: 5.6 % (ref 3.0–12.0)
Neutro Abs: 6.7 10*3/uL (ref 1.4–7.7)
Neutrophils Relative %: 64.3 % (ref 43.0–77.0)
Platelets: 287 10*3/uL (ref 150.0–400.0)
RBC: 5.22 Mil/uL (ref 4.22–5.81)
RDW: 12.4 % (ref 11.5–15.5)
WBC: 10.4 10*3/uL (ref 4.0–10.5)

## 2020-07-15 LAB — BASIC METABOLIC PANEL
BUN: 21 mg/dL (ref 6–23)
CO2: 32 mEq/L (ref 19–32)
Calcium: 9.3 mg/dL (ref 8.4–10.5)
Chloride: 100 mEq/L (ref 96–112)
Creatinine, Ser: 0.97 mg/dL (ref 0.40–1.50)
GFR: 83.79 mL/min (ref >60.00)
Glucose, Bld: 103 mg/dL — ABNORMAL HIGH (ref 70–99)
Potassium: 3.7 mEq/L (ref 3.5–5.1)
Sodium: 141 mEq/L (ref 135–145)

## 2020-07-15 LAB — LIPID PANEL
Cholesterol: 164 mg/dL (ref 0–200)
HDL: 32.8 mg/dL — ABNORMAL LOW (ref >39.00)
Total CHOL/HDL Ratio: 5
Triglycerides: 439 mg/dL — ABNORMAL HIGH (ref 0.0–149.0)

## 2020-07-15 LAB — HEPATIC FUNCTION PANEL
ALT: 14 U/L (ref 0–53)
AST: 11 U/L (ref 0–37)
Albumin: 4.2 g/dL (ref 3.5–5.2)
Alkaline Phosphatase: 72 U/L (ref 39–117)
Bilirubin, Direct: 0.1 mg/dL (ref 0.0–0.3)
Total Bilirubin: 0.5 mg/dL (ref 0.2–1.2)
Total Protein: 6.4 g/dL (ref 6.0–8.3)

## 2020-07-15 LAB — TSH: TSH: 0.65 u[IU]/mL (ref 0.35–4.50)

## 2020-07-15 LAB — MICROALBUMIN / CREATININE URINE RATIO
Creatinine,U: 91.8 mg/dL
Microalb Creat Ratio: 0.9 mg/g (ref 0.0–30.0)
Microalb, Ur: 0.8 mg/dL (ref 0.0–1.9)

## 2020-07-15 LAB — HEMOGLOBIN A1C: Hgb A1c MFr Bld: 6.7 % — ABNORMAL HIGH (ref 4.6–6.5)

## 2020-07-15 LAB — VITAMIN D 25 HYDROXY (VIT D DEFICIENCY, FRACTURES): VITD: 25.65 ng/mL — ABNORMAL LOW (ref 30.00–100.00)

## 2020-07-15 LAB — VITAMIN B12: Vitamin B-12: 261 pg/mL (ref 211–911)

## 2020-07-15 LAB — LDL CHOLESTEROL, DIRECT: Direct LDL: 91 mg/dL

## 2020-07-15 LAB — PSA: PSA: 1.41 ng/mL (ref 0.10–4.00)

## 2020-07-15 NOTE — Telephone Encounter (Signed)
   Patient is scheduled for left reverse shoulder arthroplasty on 07-24-20 at [facility] with Dr. Marlou Sa.  Patient called and said he needs to delay his surgery due to a fainting spell.  He said his PCP Cathlean Cower will let him know when he can reschedule it.   pts' cb  [phone]

## 2020-07-16 ENCOUNTER — Encounter: Payer: Self-pay | Admitting: Internal Medicine

## 2020-07-16 DIAGNOSIS — E559 Vitamin D deficiency, unspecified: Secondary | ICD-10-CM | POA: Insufficient documentation

## 2020-07-16 NOTE — Telephone Encounter (Signed)
thx

## 2020-07-16 NOTE — Assessment & Plan Note (Addendum)
Possible orthostatic, vasovagal or other -  ecg reviewed, but for echo, carotid, and MRI brain,  to f/u any worsening symptoms or concerns, consider neuro and/or cardiology referral

## 2020-07-16 NOTE — Assessment & Plan Note (Signed)
Age and sex appropriate education and counseling updated with regular exercise and diet Referrals for preventative services - declines eye exam, colonoscopy Immunizations addressed - declines covid vax Smoking counseling  - counseled to quit, pt not ready Evidence for depression or other mood disorder - none significant Most recent labs reviewed. I have personally reviewed and have noted: 1) the patient's medical and social history 2) The patient's current medications and supplements 3) The patient's height, weight, and BMI have been recorded in the chart

## 2020-07-16 NOTE — Assessment & Plan Note (Signed)
Lab Results  Component Value Date   LDLCALC 64 12/06/2019   Stable, pt to continue current statin lipitor 10

## 2020-07-16 NOTE — Assessment & Plan Note (Signed)
Improved,  BP Readings from Last 3 Encounters:  07/14/20 132/82  01/07/20 118/60  06/11/19 124/72   Stable, pt to continue medical treatment losartan, hct

## 2020-07-16 NOTE — Assessment & Plan Note (Signed)
Last vitamin D Lab Results  Component Value Date   VD25OH 25.65 (L) 07/14/2020   Low, to start oral replacement

## 2020-07-16 NOTE — Telephone Encounter (Signed)
FYI

## 2020-07-16 NOTE — Assessment & Plan Note (Signed)
Lab Results  Component Value Date   HGBA1C 6.7 (H) 07/14/2020   Stable, pt to continue current medical treatment jardiance, glucotrol, metformin

## 2020-07-21 ENCOUNTER — Other Ambulatory Visit (HOSPITAL_COMMUNITY): Payer: Medicare HMO

## 2020-07-23 ENCOUNTER — Encounter: Payer: Self-pay | Admitting: Internal Medicine

## 2020-07-23 ENCOUNTER — Ambulatory Visit (HOSPITAL_BASED_OUTPATIENT_CLINIC_OR_DEPARTMENT_OTHER): Payer: Medicare HMO

## 2020-07-23 ENCOUNTER — Other Ambulatory Visit: Payer: Self-pay

## 2020-07-23 ENCOUNTER — Ambulatory Visit (HOSPITAL_COMMUNITY)
Admission: RE | Admit: 2020-07-23 | Discharge: 2020-07-23 | Disposition: A | Discharge status: Home / Self Care | Payer: Medicare HMO | Source: Ambulatory Visit | Attending: Cardiovascular Disease | Admitting: Cardiovascular Disease

## 2020-07-23 DIAGNOSIS — R55 Syncope and collapse: Secondary | ICD-10-CM

## 2020-07-23 LAB — ECHOCARDIOGRAM COMPLETE
Area-P 1/2: 3.03 cm2
S' Lateral: 3.3 cm

## 2020-07-24 ENCOUNTER — Encounter: Payer: Self-pay | Admitting: Internal Medicine

## 2020-07-24 ENCOUNTER — Ambulatory Visit (HOSPITAL_COMMUNITY): Admission: RE | Admit: 2020-07-24 | Payer: Medicare HMO | Source: Home / Self Care | Admitting: Orthopedic Surgery

## 2020-07-24 ENCOUNTER — Encounter (HOSPITAL_COMMUNITY): Admission: RE | Payer: Self-pay | Source: Home / Self Care

## 2020-07-24 SURGERY — ARTHROPLASTY, SHOULDER, TOTAL, REVERSE
Anesthesia: General | Site: Shoulder | Laterality: Left

## 2020-07-29 NOTE — Addendum Note (Signed)
Addended by: Terence Lux A on: 07/29/2020 02:43 PM   Modules accepted: Orders

## 2020-07-31 DIAGNOSIS — M5126 Other intervertebral disc displacement, lumbar region: Secondary | ICD-10-CM | POA: Diagnosis not present

## 2020-07-31 DIAGNOSIS — G894 Chronic pain syndrome: Secondary | ICD-10-CM | POA: Diagnosis not present

## 2020-07-31 DIAGNOSIS — M545 Low back pain, unspecified: Secondary | ICD-10-CM | POA: Diagnosis not present

## 2020-08-04 ENCOUNTER — Encounter: Payer: Self-pay | Admitting: Internal Medicine

## 2020-08-07 ENCOUNTER — Encounter: Payer: Medicare HMO | Admitting: Orthopedic Surgery

## 2020-08-30 ENCOUNTER — Inpatient Hospital Stay: Admission: RE | Admit: 2020-08-30 | Payer: Medicare HMO | Source: Ambulatory Visit

## 2020-09-02 ENCOUNTER — Other Ambulatory Visit: Payer: Self-pay

## 2020-09-02 ENCOUNTER — Ambulatory Visit
Admission: RE | Admit: 2020-09-02 | Discharge: 2020-09-02 | Disposition: A | Discharge status: Home / Self Care | Payer: Medicare HMO | Source: Ambulatory Visit | Attending: Internal Medicine | Admitting: Internal Medicine

## 2020-09-02 DIAGNOSIS — G319 Degenerative disease of nervous system, unspecified: Secondary | ICD-10-CM | POA: Diagnosis not present

## 2020-09-02 DIAGNOSIS — R55 Syncope and collapse: Secondary | ICD-10-CM | POA: Diagnosis not present

## 2020-09-02 DIAGNOSIS — J321 Chronic frontal sinusitis: Secondary | ICD-10-CM | POA: Diagnosis not present

## 2020-09-02 DIAGNOSIS — I6782 Cerebral ischemia: Secondary | ICD-10-CM | POA: Diagnosis not present

## 2020-09-03 ENCOUNTER — Encounter: Payer: Self-pay | Admitting: Internal Medicine

## 2020-10-16 ENCOUNTER — Other Ambulatory Visit: Payer: Self-pay | Admitting: Internal Medicine

## 2020-10-16 ENCOUNTER — Telehealth: Payer: Self-pay | Admitting: Internal Medicine

## 2020-10-16 NOTE — Telephone Encounter (Signed)
Please refill as per office routine med refill policy (all routine meds refilled for 3 mo or monthly per pt preference up to one year from last visit, then month to month grace period for 3 mo, then further med refills will have to be denied)  

## 2020-10-16 NOTE — Telephone Encounter (Signed)
   Pt c/o medication issue:  1. Name of Medication: empagliflozin (JARDIANCE) 25 MG TABS tablet  2. How are you currently taking this medication (dosage and times per day)? As written  3. Are you having a reaction (difficulty breathing--STAT)? N/A  4. What is your medication issue? Medication too costly, $140 per refill each month. Requesting alternative

## 2020-10-17 NOTE — Telephone Encounter (Signed)
Very sorry about this  As the pt probably knows, the doctors are not allowed to know how much things cost in our computer system, even though this is apparently possible at the pharmacy  Please ask pt to call his insurance or check at the pharmacy about other meds that may be better covered such as:  Invokana, farxiga, or DPP4 such as nesina,tradjenta, onglyza or [place], or GLP1 such as trulicty, ozempic

## 2020-10-17 NOTE — Telephone Encounter (Signed)
After speaking with patient's plan, Invokana would be covered with only a $45 copayment.

## 2020-10-19 MED ORDER — CANAGLIFLOZIN 300 MG PO TABS: 300.0000 mg | ORAL_TABLET | Freq: Every day | ORAL | 2 refills | Status: DC

## 2020-10-19 NOTE — Telephone Encounter (Signed)
Ok the [place] is changed to invokana  - done erx

## 2020-10-20 ENCOUNTER — Other Ambulatory Visit: Payer: Self-pay

## 2020-10-20 NOTE — Telephone Encounter (Signed)
Patient notified via voicemail.

## 2020-11-03 ENCOUNTER — Other Ambulatory Visit: Payer: Self-pay | Admitting: Orthopedic Surgery

## 2020-11-04 ENCOUNTER — Other Ambulatory Visit: Payer: Self-pay

## 2020-11-04 ENCOUNTER — Encounter (HOSPITAL_COMMUNITY)
Admission: RE | Admit: 2020-11-04 | Discharge: 2020-11-04 | Disposition: A | Discharge status: Home / Self Care | Payer: Medicare HMO | Source: Ambulatory Visit | Attending: Orthopedic Surgery | Admitting: Orthopedic Surgery

## 2020-11-04 ENCOUNTER — Encounter (HOSPITAL_COMMUNITY): Payer: Self-pay

## 2020-11-04 DIAGNOSIS — Z01812 Encounter for preprocedural laboratory examination: Secondary | ICD-10-CM | POA: Diagnosis not present

## 2020-11-04 DIAGNOSIS — Z131 Encounter for screening for diabetes mellitus: Secondary | ICD-10-CM | POA: Insufficient documentation

## 2020-11-04 LAB — HEMOGLOBIN A1C
Hgb A1c MFr Bld: 6.4 % — ABNORMAL HIGH (ref 4.8–5.6)
Mean Plasma Glucose: 136.98 mg/dL

## 2020-11-04 LAB — BASIC METABOLIC PANEL
Anion gap: 10 (ref 5–15)
BUN: 16 mg/dL (ref 8–23)
CO2: 25 mmol/L (ref 22–32)
Calcium: 9.7 mg/dL (ref 8.9–10.3)
Chloride: 101 mmol/L (ref 98–111)
Creatinine, Ser: 0.74 mg/dL (ref 0.61–1.24)
GFR, Estimated: >60 mL/min (ref >60)
Glucose, Bld: 114 mg/dL — ABNORMAL HIGH (ref 70–99)
Potassium: 3.6 mmol/L (ref 3.5–5.1)
Sodium: 136 mmol/L (ref 135–145)

## 2020-11-04 LAB — CBC
HCT: 48.8 % (ref 39.0–52.0)
Hemoglobin: 16.3 g/dL (ref 13.0–17.0)
MCH: 32.7 pg (ref 26.0–34.0)
MCHC: 33.4 g/dL (ref 30.0–36.0)
MCV: 97.8 fL (ref 80.0–100.0)
Platelets: 308 10*3/uL (ref 150–400)
RBC: 4.99 MIL/uL (ref 4.22–5.81)
RDW: 12.8 % (ref 11.5–15.5)
WBC: 14.9 10*3/uL — ABNORMAL HIGH (ref 4.0–10.5)
nRBC: 0 % (ref 0.0–0.2)

## 2020-11-04 LAB — GLUCOSE, CAPILLARY: Glucose-Capillary: 121 mg/dL — ABNORMAL HIGH (ref 70–99)

## 2020-11-04 LAB — SARS CORONAVIRUS 2 (TAT 6-24 HRS): SARS Coronavirus 2: NEGATIVE

## 2020-11-04 LAB — SURGICAL PCR SCREEN
MRSA, PCR: NEGATIVE
Staphylococcus aureus: NEGATIVE

## 2020-11-04 NOTE — Pre-Procedure Instructions (Addendum)
Surgical Instructions    Your procedure is scheduled on Tuesday, August 30th.  Report to [facility] Main Entrance "A" at 5:30 A.M., then check in with the Admitting office.  Call this number if you have problems the morning of surgery:  [phone]   If you have any questions prior to your surgery date call [phone]: Open Monday-Friday 8am-4pm    Remember:  Do not eat after midnight the night before your surgery  You may drink clear liquids until 4:30 a.m. the morning of your surgery.   Clear liquids allowed are: Water, Non-Citrus Juices (without pulp), Carbonated Beverages, Clear Tea, Black Coffee Only, and Gatorade.  Please complete your 10 oz bottle of water that was provided to you by 4:30 a.m. the morning of surgery.  Please, if able, drink it in one setting.     Take these medicines the morning of surgery with A SIP OF WATER  atorvastatin (LIPITOR)  citalopram (CELEXA) nortriptyline (PAMELOR) tamsulosin (FLOMAX) tiZANidine (ZANAFLEX)    Take these medicines as needed the morning of surgery: Albuterol inhaler-please bring with you on the day of surgery. HYDROcodone-acetaminophen (NORCO/VICODIN)   Follow your surgeon's instructions on when to stop Aspirin.  If no instructions were given by your surgeon then you will need to call the office to get those instructions.    As of today, STOP taking any Aspirin (unless otherwise instructed by your surgeon) Aleve, Naproxen, Ibuprofen, Motrin, Advil, Goody's, BC's, all herbal medications, fish oil, and all vitamins.  WHAT DO I DO ABOUT MY DIABETES MEDICATION?   Do not take oral metFORMIN (GLUCOPHAGE) or glipiZIDE (GLUCOTROL XL) the morning of surgery.    HOW TO MANAGE YOUR DIABETES BEFORE AND AFTER SURGERY  Why is it important to control my blood sugar before and after surgery? Improving blood sugar levels before and after surgery helps healing and can limit problems. A way of improving blood sugar control is eating a  healthy diet by:  Eating less sugar and carbohydrates  Increasing activity/exercise  Talking with your doctor about reaching your blood sugar goals High blood sugars (greater than 180 mg/dL) can raise your risk of infections and slow your recovery, so you will need to focus on controlling your diabetes during the weeks before surgery. Make sure that the doctor who takes care of your diabetes knows about your planned surgery including the date and location.  How do I manage my blood sugar before surgery? Check your blood sugar at least 4 times a day, starting 2 days before surgery, to make sure that the level is not too high or low.  Check your blood sugar the morning of your surgery when you wake up and every 2 hours until you get to the Short Stay unit.  If your blood sugar is less than 70 mg/dL, you will need to treat for low blood sugar: Do not take insulin. Treat a low blood sugar (less than 70 mg/dL) with  cup of clear juice (cranberry or apple), 4 glucose tablets, OR glucose gel. Recheck blood sugar in 15 minutes after treatment (to make sure it is greater than 70 mg/dL). If your blood sugar is not greater than 70 mg/dL on recheck, call [phone] for further instructions. Report your blood sugar to the short stay nurse when you get to Short Stay.  If you are admitted to the hospital after surgery: Your blood sugar will be checked by the staff and you will probably be given insulin after surgery (instead of oral diabetes medicines)  to make sure you have good blood sugar levels. The goal for blood sugar control after surgery is 80-180 mg/dL.                      Do NOT Smoke (Tobacco/Vaping) or drink Alcohol 24 hours prior to your procedure.  If you use a CPAP at night, you may bring all equipment for your overnight stay.   Contacts, glasses, piercing's, hearing aid's, dentures or partials may not be worn into surgery, please bring cases for these belongings.    For patients  admitted to the hospital, discharge time will be determined by your treatment team.   Patients discharged the day of surgery will not be allowed to drive home, and someone needs to stay with them for 24 hours.  ONLY 1 SUPPORT PERSON MAY BE PRESENT WHILE YOU ARE IN SURGERY. IF YOU ARE TO BE ADMITTED ONCE YOU ARE IN YOUR ROOM YOU WILL BE ALLOWED TWO (2) VISITORS.  Minor children may have two parents present. Special consideration for safety and communication needs will be reviewed on a case by case basis.                                    [place]- Preparing for Total Shoulder Arthroplasty   Before surgery, you can play an important role. Because skin is not sterile, your skin needs to be as free of germs as possible. You can reduce the number of germs on your skin by using the following products. Benzoyl Peroxide Gel Reduces the number of germs present on the skin Applied twice a day to shoulder area starting two days before surgery   Chlorhexidine Gluconate (CHG) Soap An antiseptic cleaner that kills germs and bonds with the skin to continue killing germs even after washing Used for showering the night before surgery and morning of surgery   Oral Hygiene is also important to reduce your risk of infection.                                    Remember - BRUSH YOUR TEETH THE MORNING OF SURGERY WITH YOUR REGULAR TOOTHPASTE  ==================================================================  Please follow these instructions carefully:  BENZOYL PEROXIDE 5% GEL  Please do not use if you have an allergy to benzoyl peroxide.   If your skin becomes reddened/irritated stop using the benzoyl peroxide.  Starting two days before surgery, apply as follows: Apply benzoyl peroxide in the morning and at night. Apply after taking a shower. If you are not taking a shower clean entire shoulder front, back, and side along with the armpit with a clean wet washcloth.  Place a quarter-sized dollop on your  shoulder and rub in thoroughly, making sure to cover the front, back, and side of your shoulder, along with the armpit.   2 days before ____ AM   ____ PM              1 day before ____ AM   ____ PM                             Do this twice a day for two days.  (Last application is the night before surgery, AFTER using the CHG soap as described below).  Do NOT apply benzoyl peroxide  gel on the day of surgery.  CHLORHEXIDINE GLUCONATE (CHG) SOAP  Please do not use if you have an allergy to CHG or antibacterial soaps. If your skin becomes reddened/irritated stop using the CHG.   Do not shave (including legs and underarms) for at least 48 hours prior to first CHG shower. It is OK to shave your face.  Starting the night before surgery, use CHG soap as follows:  Shower the NIGHT BEFORE SURGERY and MORNING OF SURGERY with CHG.  If you choose to wash your hair, wash your hair first as usual with your normal shampoo.  After shampooing, rinse your hair and body thoroughly to remove the shampoo.  Use CHG as you would any other liquid soap.  You can apply CHG directly to the skin and wash gently with a scrungie or a clean washcloth.  Apply the CHG soap to your body ONLY FROM THE NECK DOWN.  Do not use on open wounds or open sores.  Avoid contact with your eyes, ears, mouth, and genitals (private parts).  Wash face and genitals (private parts) with your normal soap.  Wash thoroughly, paying special attention to the area where your surgery will be performed.  Thoroughly rinse your body with warm water from the neck down.  DO NOT shower/wash with your normal soap after using and rinsing off the CHG soap.   Pat yourself dry with a CLEAN TOWEL.    Apply benzoyl peroxide.   Wear CLEAN PAJAMAS to bed the night before surgery; wear comfortable clothes the morning of surgery.  Place CLEAN SHEETS on your bed the night of your first shower and DO NOT SLEEP WITH PETS.   Day of Surgery: Shower  with CHG soap. Do not wear jewelry. Do not wear lotions, powders, colognes, or deodorant. Men may shave face and neck. Do not bring valuables to the hospital. [facility] is not responsible for any belongings or valuables. Wear Clean/Comfortable clothing the morning of surgery Remember to brush your teeth WITH YOUR REGULAR TOOTHPASTE.   Please read over the following fact sheets that you were given.

## 2020-11-04 NOTE — Progress Notes (Signed)
PCP - Dr. Cathlean Cower Cardiologist - denies  Chest x-ray - n/a EKG - 07/14/20 Stress Test - 20+ years ago ECHO - 07/23/20 Cardiac Cath - denies  Sleep Study - denies CPAP - denies  Fasting Blood Sugar - 89-120 Checks Blood Sugar 1 time a day CBG at PAT 121, last A1C 6.7-07/14/20 Will collect A1C today  Blood Thinner Instructions: n/a Aspirin Instructions:hold 7 days  ERAS Protcol -Clears until 0430 DOS. PRE-SURGERY Ensure or G2- pt given 10 oz bottle of water  COVID TEST- Pt will be tested on Friday, 8/26 at [facility]. Pt went for testing on 11/03/20 but unfortunately was too early.   Anesthesia review: No  Patient denies shortness of breath, fever, cough and chest pain at PAT appointment   All instructions explained to the patient, with a verbal understanding of the material. Patient agrees to go over the instructions while at home for a better understanding. Patient also instructed to self quarantine after being tested for COVID-19. The opportunity to ask questions was provided.

## 2020-11-07 ENCOUNTER — Other Ambulatory Visit: Payer: Self-pay | Admitting: Orthopedic Surgery

## 2020-11-08 LAB — SARS CORONAVIRUS 2 (TAT 6-24 HRS): SARS Coronavirus 2: POSITIVE — AB

## 2020-11-08 NOTE — Progress Notes (Signed)
Patient was canceled due to positive COVID result.  Can you reschedule him for sometime in the near future.  May be on 22 September?

## 2020-11-08 NOTE — Progress Notes (Signed)
Notified Dr. Sharol Given, on call for Dr. Karle Barr positive covid test.

## 2020-11-10 DIAGNOSIS — J3489 Other specified disorders of nose and nasal sinuses: Secondary | ICD-10-CM | POA: Diagnosis not present

## 2020-11-10 DIAGNOSIS — Z20828 Contact with and (suspected) exposure to other viral communicable diseases: Secondary | ICD-10-CM | POA: Diagnosis not present

## 2020-11-10 DIAGNOSIS — R051 Acute cough: Secondary | ICD-10-CM | POA: Diagnosis not present

## 2020-11-10 DIAGNOSIS — J209 Acute bronchitis, unspecified: Secondary | ICD-10-CM | POA: Diagnosis not present

## 2020-11-10 DIAGNOSIS — F17200 Nicotine dependence, unspecified, uncomplicated: Secondary | ICD-10-CM | POA: Diagnosis not present

## 2020-11-12 ENCOUNTER — Other Ambulatory Visit: Payer: Self-pay | Admitting: Internal Medicine

## 2020-11-13 ENCOUNTER — Other Ambulatory Visit: Payer: Self-pay

## 2020-11-26 ENCOUNTER — Encounter: Payer: Medicare HMO | Admitting: Orthopedic Surgery

## 2020-12-22 ENCOUNTER — Other Ambulatory Visit: Payer: Self-pay | Admitting: Orthopedic Surgery

## 2020-12-22 NOTE — Progress Notes (Signed)
Surgical Instructions    Your procedure is scheduled on 12/25/20.  Report to [facility] Main Entrance "A" at 10:15 A.M., then check in with the Admitting office.  Call this number if you have problems the morning of surgery:  [phone]   If you have any questions prior to your surgery date call [phone]: Open Monday-Friday 8am-4pm    Remember:  Do not eat after midnight the night before your surgery  You may drink clear liquids until 09:15am the morning of your surgery.   Clear liquids allowed are: Water, Non-Citrus Juices (without pulp), Carbonated Beverages, Clear Tea, Black Coffee ONLY (NO MILK, CREAM OR POWDERED CREAMER of any kind), and Gatorade  Patient Instructions  The night before surgery:  No food after midnight. ONLY clear liquids after midnight  The day of surgery (if you have diabetes): Drink ONE (1) 12 oz G2 given to you in your pre admission testing appointment by 09:15am the morning of surgery. Drink in one sitting. Do not sip.  This drink was given to you during your hospital  pre-op appointment visit.  Nothing else to drink after completing the  12 oz bottle of G2.         If you have questions, please contact your surgeon's office.     Take these medicines the morning of surgery with A SIP OF WATER  acetaminophen (TYLENOL) atorvastatin (LIPITOR)  citalopram (CELEXA) nortriptyline (PAMELOR) silodosin (RAPAFLO)  IF NEEDED: albuterol (VENTOLIN HFA)   As of today, STOP taking any Aspirin (unless otherwise instructed by your surgeon) Aleve, Naproxen, Ibuprofen, Motrin, Advil, Goody's, BC's, all herbal medications, fish oil, and all vitamins.  WHAT DO I DO ABOUT MY DIABETES MEDICATION?   Do not take oral diabetes medicines (pills) the morning of surgery.     THE MORNING OF SURGERY, do not take metFORMIN (GLUCOPHAGE) or glipiZIDE (GLUCOTROL XL).  The day of surgery, do not take other diabetes injectables, including Byetta (exenatide), Bydureon  (exenatide ER), Victoza (liraglutide), or Trulicity (dulaglutide).  If your CBG is greater than 220 mg/dL, you may take  of your sliding scale (correction) dose of insulin.   HOW TO MANAGE YOUR DIABETES BEFORE AND AFTER SURGERY  Why is it important to control my blood sugar before and after surgery? Improving blood sugar levels before and after surgery helps healing and can limit problems. A way of improving blood sugar control is eating a healthy diet by:  Eating less sugar and carbohydrates  Increasing activity/exercise  Talking with your doctor about reaching your blood sugar goals High blood sugars (greater than 180 mg/dL) can raise your risk of infections and slow your recovery, so you will need to focus on controlling your diabetes during the weeks before surgery. Make sure that the doctor who takes care of your diabetes knows about your planned surgery including the date and location.  How do I manage my blood sugar before surgery? Check your blood sugar at least 4 times a day, starting 2 days before surgery, to make sure that the level is not too high or low.  Check your blood sugar the morning of your surgery when you wake up and every 2 hours until you get to the Short Stay unit.  If your blood sugar is less than 70 mg/dL, you will need to treat for low blood sugar: Do not take insulin. Treat a low blood sugar (less than 70 mg/dL) with  cup of clear juice (cranberry or apple), 4 glucose tablets, OR glucose gel. Recheck  blood sugar in 15 minutes after treatment (to make sure it is greater than 70 mg/dL). If your blood sugar is not greater than 70 mg/dL on recheck, call [phone] for further instructions. Report your blood sugar to the short stay nurse when you get to Short Stay.  If you are admitted to the hospital after surgery: Your blood sugar will be checked by the staff and you will probably be given insulin after surgery (instead of oral diabetes medicines) to make  sure you have good blood sugar levels. The goal for blood sugar control after surgery is 80-180 mg/dL.      After your COVID test   You are not required to quarantine however you are required to wear a well-fitting mask when you are out and around people not in your household.  If your mask becomes wet or soiled, replace with a new one.  Wash your hands often with soap and water for 20 seconds or clean your hands with an alcohol-based hand sanitizer that contains at least 60% alcohol.  Do not share personal items.  Notify your provider: if you are in close contact with someone who has COVID  or if you develop a fever of 100.4 or greater, sneezing, cough, sore throat, shortness of breath or body aches.             Do not wear jewelry or makeup Do not wear lotions, powders, perfumes/colognes, or deodorant. Do not shave 48 hours prior to surgery.  Men may shave face and neck. Do not bring valuables to the hospital. DO Not wear nail polish, gel polish, artificial nails, or any other type of covering on natural nails including finger and toenails. If patients have artificial nails, gel coating, etc. that need to be removed by a nail salon, please have this removed prior to surgery or surgery may need to be canceled/delayed if the surgeon/ anesthesia feels like the patient is unable to be adequately monitored.             [facility] [place] is not responsible for any belongings or valuables.  Do NOT Smoke (Tobacco/Vaping)  24 hours prior to your procedure  If you use a CPAP at night, you may bring your mask for your overnight stay.   Contacts, glasses, hearing aids, dentures or partials may not be worn into surgery, please bring cases for these belongings   For patients admitted to the hospital, discharge time will be determined by your treatment team.   Patients discharged the day of surgery will not be allowed to drive home, and someone needs to stay with them for 24 hours.  NO VISITORS  WILL BE ALLOWED IN PRE-OP WHERE PATIENTS ARE PREPPED FOR SURGERY.  ONLY 1 SUPPORT PERSON MAY BE PRESENT IN THE WAITING ROOM WHILE YOU ARE IN SURGERY.  IF YOU ARE TO BE ADMITTED, ONCE YOU ARE IN YOUR ROOM YOU WILL BE ALLOWED TWO (2) VISITORS. 1 (ONE) VISITOR MAY STAY OVERNIGHT BUT MUST ARRIVE TO THE ROOM BY 8pm.  Minor children may have two parents present. Special consideration for safety and communication needs will be reviewed on a case by case basis.  Oral Hygiene is also important to reduce your risk of infection.  Remember - BRUSH YOUR TEETH THE MORNING OF SURGERY WITH YOUR REGULAR TOOTHPASTE  [place]- Preparing for Total Shoulder Arthroplasty  Before surgery, you can play an important role. Because skin is not sterile, your skin needs to be as free of germs as possible. You  can reduce the number of germs on your skin by using the following products.   Benzoyl Peroxide Gel  o Reduces the number of germs present on the skin  o Applied twice a day to shoulder area starting two days before surgery   Chlorhexidine Gluconate (CHG) Soap (instructions listed above on how to wash with CHG Soap)  o An antiseptic cleaner that kills germs and bonds with the skin to continue killing germs even after washing  o Used for showering the night before surgery and morning of surgery   ==================================================================  Please follow these instructions carefully:  BENZOYL PEROXIDE 5% GEL  Please do not use if you have an allergy to benzoyl peroxide. If your skin becomes reddened/irritated stop using the benzoyl peroxide.  Starting two days before surgery, apply as follows:  1. Apply benzoyl peroxide in the morning and at night. Apply after taking a shower. If you are not taking a shower clean entire shoulder front, back, and side along with the armpit with a clean wet washcloth.  2. Place a quarter-sized dollop on your SHOULDER and rub in thoroughly, making  sure to cover the front, back, and side of your shoulder, along with the armpit.   2 Days prior to Surgery First Dose on 12/23/20 Morning Second Dose on 12/23/20 Night  Day Before Surgery First Dose on 12/24/20 Morning Night before surgery wash (entire body except face and private areas) with CHG Soap THEN Second Dose on 12/24/20 Night   Morning of Surgery  wash BODY AGAIN with CHG Soap   4. Do NOT apply benzoyl peroxide gel on the day of surgery   [place]- Preparing For Surgery  Before surgery, you can play an important role. Because skin is not sterile, your skin needs to be as free of germs as possible. You can reduce the number of germs on your skin by washing with CHG (chlorahexidine gluconate) Soap before surgery.  CHG is an antiseptic cleaner which kills germs and bonds with the skin to continue killing germs even after washing.     Please do not use if you have an allergy to CHG or antibacterial soaps. If your skin becomes reddened/irritated stop using the CHG.  Do not shave (including legs and underarms) for at least 48 hours prior to first CHG shower. It is OK to shave your face.  Please follow these instructions carefully.     Shower the NIGHT BEFORE SURGERY and the MORNING OF SURGERY with CHG Soap.   If you chose to wash your hair, wash your hair first as usual with your normal shampoo. After you shampoo, rinse your hair and body thoroughly to remove the shampoo.  Then [employer] and genitals (private parts) with your normal soap and rinse thoroughly to remove soap.  After that Use CHG Soap as you would any other liquid soap. You can apply CHG directly to the skin and wash gently with a scrungie or a clean washcloth.   Apply the CHG Soap to your body ONLY FROM THE NECK DOWN.  Do not use on open wounds or open sores. Avoid contact with your eyes, ears, mouth and genitals (private parts). Wash Face and genitals (private parts)  with your normal soap.   Wash  thoroughly, paying special attention to the area where your surgery will be performed.  Thoroughly rinse your body with warm water from the neck down.  DO NOT shower/wash with your normal soap after using and rinsing off the CHG Soap.  Pat yourself dry with a CLEAN TOWEL.  8. Apply the Benzoyl Peroxide only the night before surgery.  Do Not use it the morning of surgery.  Wear CLEAN PAJAMAS to bed the night before surgery  Place CLEAN SHEETS on your bed the night before your surgery  DO NOT SLEEP WITH PETS.   Day of Surgery: Take a shower with CHG soap. Wear Clean/Comfortable clothing the morning of surgery Do not apply any deodorants/lotions.   Remember to brush your teeth WITH YOUR REGULAR TOOTHPASTE.   Please read over the following fact sheets that you were given.

## 2020-12-23 ENCOUNTER — Encounter (HOSPITAL_COMMUNITY)
Admission: RE | Admit: 2020-12-23 | Discharge: 2020-12-23 | Disposition: A | Discharge status: Home / Self Care | Payer: Medicare HMO | Source: Ambulatory Visit | Attending: Orthopedic Surgery | Admitting: Orthopedic Surgery

## 2020-12-23 ENCOUNTER — Encounter (HOSPITAL_COMMUNITY): Payer: Self-pay

## 2020-12-23 ENCOUNTER — Other Ambulatory Visit: Payer: Self-pay

## 2020-12-23 DIAGNOSIS — R7309 Other abnormal glucose: Secondary | ICD-10-CM | POA: Insufficient documentation

## 2020-12-23 DIAGNOSIS — Z01812 Encounter for preprocedural laboratory examination: Secondary | ICD-10-CM | POA: Diagnosis not present

## 2020-12-23 LAB — BASIC METABOLIC PANEL
Anion gap: 9 (ref 5–15)
BUN: 17 mg/dL (ref 8–23)
CO2: 27 mmol/L (ref 22–32)
Calcium: 9.4 mg/dL (ref 8.9–10.3)
Chloride: 100 mmol/L (ref 98–111)
Creatinine, Ser: 0.9 mg/dL (ref 0.61–1.24)
GFR, Estimated: >60 mL/min (ref >60)
Glucose, Bld: 116 mg/dL — ABNORMAL HIGH (ref 70–99)
Potassium: 4.3 mmol/L (ref 3.5–5.1)
Sodium: 136 mmol/L (ref 135–145)

## 2020-12-23 LAB — CBC
HCT: 46.2 % (ref 39.0–52.0)
Hemoglobin: 15.7 g/dL (ref 13.0–17.0)
MCH: 33.3 pg (ref 26.0–34.0)
MCHC: 34 g/dL (ref 30.0–36.0)
MCV: 97.9 fL (ref 80.0–100.0)
Platelets: 276 10*3/uL (ref 150–400)
RBC: 4.72 MIL/uL (ref 4.22–5.81)
RDW: 11.9 % (ref 11.5–15.5)
WBC: 12.4 10*3/uL — ABNORMAL HIGH (ref 4.0–10.5)
nRBC: 0 % (ref 0.0–0.2)

## 2020-12-23 LAB — SURGICAL PCR SCREEN
MRSA, PCR: NEGATIVE
Staphylococcus aureus: NEGATIVE

## 2020-12-23 LAB — GLUCOSE, CAPILLARY: Glucose-Capillary: 137 mg/dL — ABNORMAL HIGH (ref 70–99)

## 2020-12-23 LAB — SARS CORONAVIRUS 2 (TAT 6-24 HRS): SARS Coronavirus 2: NEGATIVE

## 2020-12-23 NOTE — Progress Notes (Signed)
PCP - Dr Cathlean Cower Cardiologist - denies  PPM/ICD - denies   Chest x-ray - 01/02/16 EKG - 07/14/20 Stress Test - pt states "20 + years ago" ECHO - 07/23/20 Cardiac Cath - denies  Sleep Study - denies  DM- Type 2 Fasting Blood Sugar - 120's Checks Blood Sugar once every other day  Blood Thinner Instructions: n/a Aspirin Instructions: pt says he was instructed to stop it so he hasn't taken it since last week  ERAS Protcol - yes PRE-SURGERY G2-  given  COVID TEST- n/a, pt was positive on 11/07/20. Pt was re-tested on 12/22/20 and result was negative   Anesthesia review: no  Patient denies shortness of breath, fever, cough and chest pain at PAT appointment   All instructions explained to the patient, with a verbal understanding of the material. Patient agrees to go over the instructions while at home for a better understanding. Patient also instructed to wear a mask in public after being tested for COVID-19. The opportunity to ask questions was provided.

## 2020-12-25 ENCOUNTER — Observation Stay (HOSPITAL_COMMUNITY)
Admission: RE | Admit: 2020-12-25 | Discharge: 2020-12-26 | Disposition: A | Discharge status: Home / Self Care | Payer: Medicare HMO | Attending: Orthopedic Surgery | Admitting: Orthopedic Surgery

## 2020-12-25 ENCOUNTER — Other Ambulatory Visit: Payer: Self-pay

## 2020-12-25 ENCOUNTER — Ambulatory Visit (HOSPITAL_COMMUNITY): Payer: Medicare HMO | Admitting: Anesthesiology

## 2020-12-25 ENCOUNTER — Observation Stay (HOSPITAL_COMMUNITY): Payer: Medicare HMO

## 2020-12-25 ENCOUNTER — Encounter (HOSPITAL_COMMUNITY)
Admission: RE | Disposition: A | Discharge status: Home / Self Care | Payer: Self-pay | Source: Home / Self Care | Attending: Orthopedic Surgery

## 2020-12-25 ENCOUNTER — Encounter (HOSPITAL_COMMUNITY): Payer: Self-pay | Admitting: Orthopedic Surgery

## 2020-12-25 DIAGNOSIS — Z9889 Other specified postprocedural states: Secondary | ICD-10-CM

## 2020-12-25 DIAGNOSIS — M25712 Osteophyte, left shoulder: Secondary | ICD-10-CM | POA: Diagnosis not present

## 2020-12-25 DIAGNOSIS — Z471 Aftercare following joint replacement surgery: Secondary | ICD-10-CM | POA: Diagnosis not present

## 2020-12-25 DIAGNOSIS — I10 Essential (primary) hypertension: Secondary | ICD-10-CM | POA: Insufficient documentation

## 2020-12-25 DIAGNOSIS — Z96612 Presence of left artificial shoulder joint: Secondary | ICD-10-CM

## 2020-12-25 DIAGNOSIS — Z79899 Other long term (current) drug therapy: Secondary | ICD-10-CM | POA: Insufficient documentation

## 2020-12-25 DIAGNOSIS — G8918 Other acute postprocedural pain: Secondary | ICD-10-CM | POA: Diagnosis not present

## 2020-12-25 DIAGNOSIS — M12812 Other specific arthropathies, not elsewhere classified, left shoulder: Secondary | ICD-10-CM | POA: Diagnosis not present

## 2020-12-25 DIAGNOSIS — M75102 Unspecified rotator cuff tear or rupture of left shoulder, not specified as traumatic: Secondary | ICD-10-CM | POA: Diagnosis not present

## 2020-12-25 DIAGNOSIS — F17210 Nicotine dependence, cigarettes, uncomplicated: Secondary | ICD-10-CM | POA: Insufficient documentation

## 2020-12-25 DIAGNOSIS — M19012 Primary osteoarthritis, left shoulder: Secondary | ICD-10-CM | POA: Diagnosis not present

## 2020-12-25 DIAGNOSIS — Z7982 Long term (current) use of aspirin: Secondary | ICD-10-CM | POA: Diagnosis not present

## 2020-12-25 DIAGNOSIS — Z96642 Presence of left artificial hip joint: Secondary | ICD-10-CM | POA: Diagnosis not present

## 2020-12-25 DIAGNOSIS — E119 Type 2 diabetes mellitus without complications: Secondary | ICD-10-CM | POA: Diagnosis not present

## 2020-12-25 DIAGNOSIS — Z7984 Long term (current) use of oral hypoglycemic drugs: Secondary | ICD-10-CM | POA: Insufficient documentation

## 2020-12-25 DIAGNOSIS — E785 Hyperlipidemia, unspecified: Secondary | ICD-10-CM | POA: Diagnosis not present

## 2020-12-25 DIAGNOSIS — E559 Vitamin D deficiency, unspecified: Secondary | ICD-10-CM | POA: Diagnosis not present

## 2020-12-25 DIAGNOSIS — J9811 Atelectasis: Secondary | ICD-10-CM | POA: Diagnosis not present

## 2020-12-25 HISTORY — PX: REVERSE SHOULDER ARTHROPLASTY: SHX5054

## 2020-12-25 LAB — GLUCOSE, CAPILLARY
Glucose-Capillary: 95 mg/dL (ref 70–99)
Glucose-Capillary: 131 mg/dL — ABNORMAL HIGH (ref 70–99)
Glucose-Capillary: 241 mg/dL — ABNORMAL HIGH (ref 70–99)
Glucose-Capillary: 237 mg/dL — ABNORMAL HIGH (ref 70–99)

## 2020-12-25 SURGERY — ARTHROPLASTY, SHOULDER, TOTAL, REVERSE
Anesthesia: General | Site: Shoulder | Laterality: Left

## 2020-12-25 MED ORDER — ONDANSETRON HCL 4 MG/2ML IJ SOLN: 4.0000 mg | Freq: Four times a day (QID) | INTRAMUSCULAR | Status: DC | PRN

## 2020-12-25 MED ORDER — TRANEXAMIC ACID-NACL 1000-0.7 MG/100ML-% IV SOLN
1000.0000 mg | INTRAVENOUS | Status: AC
  Administered 2020-12-25: 1000 mg via INTRAVENOUS
  Filled 2020-12-25: qty 100

## 2020-12-25 MED ORDER — BUPIVACAINE LIPOSOME 1.3 % IJ SUSP
INTRAMUSCULAR | Status: DC | PRN
  Administered 2020-12-25: 10 mL via PERINEURAL

## 2020-12-25 MED ORDER — MIDAZOLAM HCL 2 MG/2ML IJ SOLN: 1.0000 mg | Freq: Once | INTRAMUSCULAR | Status: AC

## 2020-12-25 MED ORDER — FENTANYL CITRATE (PF) 100 MCG/2ML IJ SOLN: 50.0000 ug | Freq: Once | INTRAMUSCULAR | Status: AC

## 2020-12-25 MED ORDER — PHENYLEPHRINE 40 MCG/ML (10ML) SYRINGE FOR IV PUSH (FOR BLOOD PRESSURE SUPPORT)
PREFILLED_SYRINGE | INTRAVENOUS | Status: DC | PRN
  Administered 2020-12-25 (×5): 80 ug via INTRAVENOUS

## 2020-12-25 MED ORDER — PROMETHAZINE HCL 25 MG/ML IJ SOLN
6.2500 mg | INTRAMUSCULAR | Status: DC | PRN
Start: 2020-12-25 — End: 2020-12-25

## 2020-12-25 MED ORDER — GLYCOPYRROLATE PF 0.2 MG/ML IJ SOSY
PREFILLED_SYRINGE | INTRAMUSCULAR | Status: AC
  Filled 2020-12-25: qty 1

## 2020-12-25 MED ORDER — FENTANYL CITRATE (PF) 100 MCG/2ML IJ SOLN
INTRAMUSCULAR | Status: AC
  Administered 2020-12-25: 50 ug via INTRAVENOUS
  Filled 2020-12-25: qty 2

## 2020-12-25 MED ORDER — POVIDONE-IODINE 7.5 % EX SOLN: Freq: Once | CUTANEOUS | Status: DC

## 2020-12-25 MED ORDER — VANCOMYCIN HCL 1000 MG IV SOLR
INTRAVENOUS | Status: AC
  Filled 2020-12-25: qty 20

## 2020-12-25 MED ORDER — CHLORHEXIDINE GLUCONATE 0.12 % MT SOLN
15.0000 mL | Freq: Once | OROMUCOSAL | Status: DC
  Filled 2020-12-25: qty 15

## 2020-12-25 MED ORDER — INSULIN ASPART 100 UNIT/ML IJ SOLN
0.0000 [IU] | Freq: Three times a day (TID) | INTRAMUSCULAR | Status: DC
  Administered 2020-12-25: 2 [IU] via SUBCUTANEOUS
  Administered 2020-12-26: 3 [IU] via SUBCUTANEOUS

## 2020-12-25 MED ORDER — ONDANSETRON HCL 4 MG/2ML IJ SOLN
INTRAMUSCULAR | Status: DC | PRN
  Administered 2020-12-25: 4 mg via INTRAVENOUS

## 2020-12-25 MED ORDER — BUPIVACAINE-EPINEPHRINE (PF) 0.5% -1:200000 IJ SOLN
INTRAMUSCULAR | Status: DC | PRN
  Administered 2020-12-25: 15 mL via PERINEURAL

## 2020-12-25 MED ORDER — METFORMIN HCL 500 MG PO TABS
1000.0000 mg | ORAL_TABLET | Freq: Every day | ORAL | Status: DC
  Administered 2020-12-26: 1000 mg via ORAL
  Filled 2020-12-25: qty 2

## 2020-12-25 MED ORDER — OXYCODONE HCL 5 MG/5ML PO SOLN: 5.0000 mg | Freq: Once | ORAL | Status: DC | PRN

## 2020-12-25 MED ORDER — ONDANSETRON HCL 4 MG/2ML IJ SOLN
INTRAMUSCULAR | Status: AC
  Filled 2020-12-25: qty 2

## 2020-12-25 MED ORDER — METOCLOPRAMIDE HCL 5 MG PO TABS
5.0000 mg | ORAL_TABLET | Freq: Three times a day (TID) | ORAL | Status: DC | PRN
Start: 2020-12-25 — End: 2020-12-27

## 2020-12-25 MED ORDER — VANCOMYCIN HCL 1000 MG IV SOLR
INTRAVENOUS | Status: DC | PRN
  Administered 2020-12-25: 1000 mg via TOPICAL

## 2020-12-25 MED ORDER — FENTANYL CITRATE (PF) 250 MCG/5ML IJ SOLN
INTRAMUSCULAR | Status: AC
  Filled 2020-12-25: qty 5

## 2020-12-25 MED ORDER — ORAL CARE MOUTH RINSE: 15.0000 mL | Freq: Once | OROMUCOSAL | Status: DC

## 2020-12-25 MED ORDER — PROPOFOL 10 MG/ML IV BOLUS
INTRAVENOUS | Status: AC
  Filled 2020-12-25: qty 40

## 2020-12-25 MED ORDER — VASOPRESSIN 20 UNIT/ML IV SOLN
INTRAVENOUS | Status: DC | PRN
  Administered 2020-12-25: 2 [IU] via INTRAVENOUS
  Administered 2020-12-25: 1 [IU] via INTRAVENOUS
  Administered 2020-12-25: 2 [IU] via INTRAVENOUS
  Administered 2020-12-25: 1 [IU] via INTRAVENOUS
  Administered 2020-12-25 (×3): 2 [IU] via INTRAVENOUS

## 2020-12-25 MED ORDER — PHENYLEPHRINE HCL (PRESSORS) 10 MG/ML IV SOLN
INTRAVENOUS | Status: AC
  Filled 2020-12-25: qty 2

## 2020-12-25 MED ORDER — GLIPIZIDE ER 10 MG PO TB24
10.0000 mg | ORAL_TABLET | Freq: Every day | ORAL | Status: DC
  Administered 2020-12-26: 10 mg via ORAL
  Filled 2020-12-25 (×2): qty 1

## 2020-12-25 MED ORDER — PHENYLEPHRINE 40 MCG/ML (10ML) SYRINGE FOR IV PUSH (FOR BLOOD PRESSURE SUPPORT)
PREFILLED_SYRINGE | INTRAVENOUS | Status: AC
  Filled 2020-12-25: qty 10

## 2020-12-25 MED ORDER — ACETAMINOPHEN 325 MG PO TABS: 325.0000 mg | ORAL_TABLET | Freq: Four times a day (QID) | ORAL | Status: DC | PRN

## 2020-12-25 MED ORDER — ONDANSETRON HCL 4 MG PO TABS: 4.0000 mg | ORAL_TABLET | Freq: Four times a day (QID) | ORAL | Status: DC | PRN

## 2020-12-25 MED ORDER — LIDOCAINE 2% (20 MG/ML) 5 ML SYRINGE
INTRAMUSCULAR | Status: DC | PRN
  Administered 2020-12-25: 100 mg via INTRAVENOUS

## 2020-12-25 MED ORDER — MIDAZOLAM HCL 2 MG/2ML IJ SOLN
INTRAMUSCULAR | Status: AC
  Administered 2020-12-25: 1 mg via INTRAVENOUS
  Filled 2020-12-25: qty 2

## 2020-12-25 MED ORDER — METFORMIN HCL 500 MG PO TABS: 500.0000 mg | ORAL_TABLET | ORAL | Status: DC

## 2020-12-25 MED ORDER — INSULIN ASPART 100 UNIT/ML IJ SOLN
INTRAMUSCULAR | Status: AC
  Filled 2020-12-25: qty 1

## 2020-12-25 MED ORDER — ASPIRIN EC 81 MG PO TBEC
81.0000 mg | DELAYED_RELEASE_TABLET | Freq: Every day | ORAL | Status: DC
  Administered 2020-12-25 – 2020-12-26 (×2): 81 mg via ORAL
  Filled 2020-12-25 (×2): qty 1

## 2020-12-25 MED ORDER — LACTATED RINGERS IV SOLN
INTRAVENOUS | Status: DC
Start: 2020-12-25 — End: 2020-12-25

## 2020-12-25 MED ORDER — ACETAMINOPHEN 500 MG PO TABS
1000.0000 mg | ORAL_TABLET | Freq: Four times a day (QID) | ORAL | Status: AC
  Administered 2020-12-25 – 2020-12-26 (×4): 1000 mg via ORAL
  Filled 2020-12-25 (×4): qty 2

## 2020-12-25 MED ORDER — GLYCOPYRROLATE PF 0.2 MG/ML IJ SOSY
PREFILLED_SYRINGE | INTRAMUSCULAR | Status: DC | PRN
  Administered 2020-12-25: .2 mg via INTRAVENOUS

## 2020-12-25 MED ORDER — ALBUMIN HUMAN 5 % IV SOLN: INTRAVENOUS | Status: DC | PRN

## 2020-12-25 MED ORDER — DEXAMETHASONE SODIUM PHOSPHATE 10 MG/ML IJ SOLN
INTRAMUSCULAR | Status: DC | PRN
  Administered 2020-12-25: 5 mg via INTRAVENOUS

## 2020-12-25 MED ORDER — METFORMIN HCL 500 MG PO TABS
500.0000 mg | ORAL_TABLET | Freq: Every day | ORAL | Status: DC
  Administered 2020-12-25: 500 mg via ORAL
  Filled 2020-12-25: qty 1

## 2020-12-25 MED ORDER — ROCURONIUM BROMIDE 10 MG/ML (PF) SYRINGE
PREFILLED_SYRINGE | INTRAVENOUS | Status: AC
  Filled 2020-12-25: qty 10

## 2020-12-25 MED ORDER — DEXAMETHASONE SODIUM PHOSPHATE 10 MG/ML IJ SOLN
INTRAMUSCULAR | Status: AC
  Filled 2020-12-25: qty 1

## 2020-12-25 MED ORDER — METHOCARBAMOL 1000 MG/10ML IJ SOLN
500.0000 mg | Freq: Four times a day (QID) | INTRAVENOUS | Status: DC | PRN
  Filled 2020-12-25: qty 5

## 2020-12-25 MED ORDER — VASOPRESSIN 20 UNIT/ML IV SOLN
INTRAVENOUS | Status: AC
  Filled 2020-12-25: qty 1

## 2020-12-25 MED ORDER — 0.9 % SODIUM CHLORIDE (POUR BTL) OPTIME
TOPICAL | Status: DC | PRN
  Administered 2020-12-25: 1000 mL

## 2020-12-25 MED ORDER — HYDROCHLOROTHIAZIDE 25 MG PO TABS
25.0000 mg | ORAL_TABLET | Freq: Every day | ORAL | Status: DC
  Administered 2020-12-26: 25 mg via ORAL
  Filled 2020-12-25: qty 1

## 2020-12-25 MED ORDER — METOCLOPRAMIDE HCL 5 MG/ML IJ SOLN: 5.0000 mg | Freq: Three times a day (TID) | INTRAMUSCULAR | Status: DC | PRN

## 2020-12-25 MED ORDER — PROPOFOL 10 MG/ML IV BOLUS
INTRAVENOUS | Status: DC | PRN
  Administered 2020-12-25: 150 mg via INTRAVENOUS
  Administered 2020-12-25: 50 mg via INTRAVENOUS

## 2020-12-25 MED ORDER — HYDROMORPHONE HCL 1 MG/ML IJ SOLN: 0.5000 mg | INTRAMUSCULAR | Status: DC | PRN

## 2020-12-25 MED ORDER — PHENOL 1.4 % MT LIQD: 1.0000 | OROMUCOSAL | Status: DC | PRN

## 2020-12-25 MED ORDER — EPHEDRINE SULFATE-NACL 50-0.9 MG/10ML-% IV SOSY
PREFILLED_SYRINGE | INTRAVENOUS | Status: DC | PRN
  Administered 2020-12-25: 10 mg via INTRAVENOUS
  Administered 2020-12-25 (×2): 5 mg via INTRAVENOUS
  Administered 2020-12-25 (×2): 10 mg via INTRAVENOUS
  Administered 2020-12-25 (×2): 5 mg via INTRAVENOUS

## 2020-12-25 MED ORDER — TAMSULOSIN HCL 0.4 MG PO CAPS
0.4000 mg | ORAL_CAPSULE | Freq: Every day | ORAL | Status: DC
  Administered 2020-12-26: 0.4 mg via ORAL
  Filled 2020-12-25: qty 1

## 2020-12-25 MED ORDER — DOCUSATE SODIUM 100 MG PO CAPS
100.0000 mg | ORAL_CAPSULE | Freq: Two times a day (BID) | ORAL | Status: DC
  Administered 2020-12-25 – 2020-12-26 (×2): 100 mg via ORAL
  Filled 2020-12-25 (×2): qty 1

## 2020-12-25 MED ORDER — OXYCODONE HCL 5 MG PO TABS
5.0000 mg | ORAL_TABLET | ORAL | Status: DC | PRN
  Administered 2020-12-25 – 2020-12-26 (×4): 5 mg via ORAL
  Filled 2020-12-25 (×4): qty 1

## 2020-12-25 MED ORDER — FENTANYL CITRATE (PF) 250 MCG/5ML IJ SOLN
INTRAMUSCULAR | Status: DC | PRN
  Administered 2020-12-25: 25 ug via INTRAVENOUS
  Administered 2020-12-25: 100 ug via INTRAVENOUS
  Administered 2020-12-25: 50 ug via INTRAVENOUS

## 2020-12-25 MED ORDER — CEFAZOLIN SODIUM-DEXTROSE 2-4 GM/100ML-% IV SOLN
2.0000 g | Freq: Four times a day (QID) | INTRAVENOUS | Status: AC
  Administered 2020-12-25 – 2020-12-26 (×3): 2 g via INTRAVENOUS
  Filled 2020-12-25 (×3): qty 100

## 2020-12-25 MED ORDER — PHENYLEPHRINE HCL-NACL 20-0.9 MG/250ML-% IV SOLN
INTRAVENOUS | Status: DC | PRN
  Administered 2020-12-25: 40 ug/min via INTRAVENOUS
  Administered 2020-12-25: 120 ug/min via INTRAVENOUS

## 2020-12-25 MED ORDER — LACTATED RINGERS IV SOLN: INTRAVENOUS | Status: DC

## 2020-12-25 MED ORDER — FENTANYL CITRATE (PF) 100 MCG/2ML IJ SOLN: 25.0000 ug | INTRAMUSCULAR | Status: DC | PRN

## 2020-12-25 MED ORDER — MENTHOL 3 MG MT LOZG: 1.0000 | LOZENGE | OROMUCOSAL | Status: DC | PRN

## 2020-12-25 MED ORDER — CEFAZOLIN SODIUM-DEXTROSE 2-4 GM/100ML-% IV SOLN
2.0000 g | INTRAVENOUS | Status: AC
  Administered 2020-12-25: 2 g via INTRAVENOUS
  Filled 2020-12-25: qty 100

## 2020-12-25 MED ORDER — LOSARTAN POTASSIUM 50 MG PO TABS
100.0000 mg | ORAL_TABLET | Freq: Every day | ORAL | Status: DC
  Administered 2020-12-26: 100 mg via ORAL
  Filled 2020-12-25: qty 2

## 2020-12-25 MED ORDER — CHLORHEXIDINE GLUCONATE 0.12 % MT SOLN
15.0000 mL | Freq: Once | OROMUCOSAL | Status: AC
  Administered 2020-12-25: 15 mL via OROMUCOSAL

## 2020-12-25 MED ORDER — EPHEDRINE 5 MG/ML INJ
INTRAVENOUS | Status: AC
  Filled 2020-12-25: qty 5

## 2020-12-25 MED ORDER — STERILE WATER FOR IRRIGATION IR SOLN
Status: DC | PRN
  Administered 2020-12-25: 1000 mL

## 2020-12-25 MED ORDER — ALBUTEROL SULFATE (2.5 MG/3ML) 0.083% IN NEBU: 2.5000 mg | INHALATION_SOLUTION | Freq: Four times a day (QID) | RESPIRATORY_TRACT | Status: DC | PRN

## 2020-12-25 MED ORDER — SUGAMMADEX SODIUM 200 MG/2ML IV SOLN
INTRAVENOUS | Status: DC | PRN
  Administered 2020-12-25: 200 mg via INTRAVENOUS

## 2020-12-25 MED ORDER — OXYCODONE HCL 5 MG PO TABS: 5.0000 mg | ORAL_TABLET | Freq: Once | ORAL | Status: DC | PRN

## 2020-12-25 MED ORDER — METHOCARBAMOL 500 MG PO TABS
500.0000 mg | ORAL_TABLET | Freq: Four times a day (QID) | ORAL | Status: DC | PRN
  Administered 2020-12-25 – 2020-12-26 (×3): 500 mg via ORAL
  Filled 2020-12-25 (×3): qty 1

## 2020-12-25 MED ORDER — LIDOCAINE 2% (20 MG/ML) 5 ML SYRINGE
INTRAMUSCULAR | Status: AC
  Filled 2020-12-25: qty 5

## 2020-12-25 MED ORDER — ROCURONIUM BROMIDE 10 MG/ML (PF) SYRINGE
PREFILLED_SYRINGE | INTRAVENOUS | Status: DC | PRN
Start: 2020-12-25 — End: 2020-12-25
  Administered 2020-12-25: 20 mg via INTRAVENOUS
  Administered 2020-12-25: 60 mg via INTRAVENOUS

## 2020-12-25 MED ORDER — ORAL CARE MOUTH RINSE: 15.0000 mL | Freq: Once | OROMUCOSAL | Status: AC

## 2020-12-25 MED ORDER — ALBUTEROL SULFATE HFA 108 (90 BASE) MCG/ACT IN AERS: 2.0000 | INHALATION_SPRAY | Freq: Four times a day (QID) | RESPIRATORY_TRACT | Status: DC | PRN

## 2020-12-25 MED ORDER — INSULIN ASPART 100 UNIT/ML IJ SOLN
0.0000 [IU] | Freq: Every day | INTRAMUSCULAR | Status: DC
  Administered 2020-12-25: 2 [IU] via SUBCUTANEOUS

## 2020-12-25 MED ORDER — MIDAZOLAM HCL 2 MG/2ML IJ SOLN
INTRAMUSCULAR | Status: AC
  Filled 2020-12-25: qty 2

## 2020-12-25 MED ORDER — POVIDONE-IODINE 10 % EX SWAB: 2.0000 "application " | Freq: Once | CUTANEOUS | Status: DC

## 2020-12-25 MED ORDER — ACETAMINOPHEN 500 MG PO TABS
1000.0000 mg | ORAL_TABLET | Freq: Once | ORAL | Status: AC
  Administered 2020-12-25: 1000 mg via ORAL
  Filled 2020-12-25: qty 2

## 2020-12-25 MED ORDER — NORTRIPTYLINE HCL 25 MG PO CAPS
50.0000 mg | ORAL_CAPSULE | Freq: Three times a day (TID) | ORAL | Status: DC
  Administered 2020-12-25 – 2020-12-26 (×2): 50 mg via ORAL
  Filled 2020-12-25 (×4): qty 2

## 2020-12-25 MED ORDER — CITALOPRAM HYDROBROMIDE 20 MG PO TABS
20.0000 mg | ORAL_TABLET | Freq: Every day | ORAL | Status: DC
  Administered 2020-12-26: 20 mg via ORAL
  Filled 2020-12-25: qty 1

## 2020-12-25 SURGICAL SUPPLY — 88 items
AID PSTN UNV HD RSTRNT DISP (MISCELLANEOUS) ×1
ALCOHOL 70% 16 OZ (MISCELLANEOUS) ×2 IMPLANT
APL PRP STRL LF DISP 70% ISPRP (MISCELLANEOUS) ×1
AUG COMP REV MI TAPER ADAPTER (Joint) ×2 IMPLANT
AUGMENT COMP REV MI TAPR ADPTR (Joint) IMPLANT
BAG COUNTER SPONGE SURGICOUNT (BAG) ×2 IMPLANT
BAG SPNG CNTER NS LX DISP (BAG) ×1
BIT DRILL 2.7 W/STOP DISP (BIT) ×1 IMPLANT
BIT DRILL QUICK REL 1/8 2PK SL (DRILL) IMPLANT
BIT DRILL TWIST 2.7 (BIT) ×1 IMPLANT
BLADE SAW SGTL 13X75X1.27 (BLADE) ×2 IMPLANT
BRNG HUM +3 36 RVRS SHLDR (Shoulder) ×1 IMPLANT
BSPLAT GLND SM AUG TPR ADPR (Joint) ×1 IMPLANT
CHLORAPREP W/TINT 26 (MISCELLANEOUS) ×2 IMPLANT
CLSR STERI-STRIP ANTIMIC 1/2X4 (GAUZE/BANDAGES/DRESSINGS) ×1 IMPLANT
COOLER ICEMAN CLASSIC (MISCELLANEOUS) ×2 IMPLANT
COVER SURGICAL LIGHT HANDLE (MISCELLANEOUS) ×2 IMPLANT
DRAPE INCISE IOBAN 66X45 STRL (DRAPES) ×3 IMPLANT
DRAPE U-SHAPE 47X51 STRL (DRAPES) ×4 IMPLANT
DRILL QUICK RELEASE 1/8 INCH (DRILL) ×2
DRSG AQUACEL AG ADV 3.5X10 (GAUZE/BANDAGES/DRESSINGS) ×2 IMPLANT
ELECT BLADE 4.0 EZ CLEAN MEGAD (MISCELLANEOUS) ×2
ELECT REM PT RETURN 9FT ADLT (ELECTROSURGICAL) ×2
ELECTRODE BLDE 4.0 EZ CLN MEGD (MISCELLANEOUS) ×1 IMPLANT
ELECTRODE REM PT RTRN 9FT ADLT (ELECTROSURGICAL) ×1 IMPLANT
GAUZE SPONGE 4X4 12PLY STRL LF (GAUZE/BANDAGES/DRESSINGS) ×2 IMPLANT
GLENOID SPHERE 36MM CVD +3 (Orthopedic Implant) ×1 IMPLANT
GLOVE SRG 8 PF TXTR STRL LF DI (GLOVE) ×1 IMPLANT
GLOVE SURG LTX SZ7 (GLOVE) ×2 IMPLANT
GLOVE SURG LTX SZ8 (GLOVE) ×2 IMPLANT
GLOVE SURG UNDER POLY LF SZ7 (GLOVE) ×2 IMPLANT
GLOVE SURG UNDER POLY LF SZ8 (GLOVE) ×2
GOWN STRL REUS W/ TWL LRG LVL3 (GOWN DISPOSABLE) ×2 IMPLANT
GOWN STRL REUS W/ TWL XL LVL3 (GOWN DISPOSABLE) IMPLANT
GOWN STRL REUS W/TWL LRG LVL3 (GOWN DISPOSABLE) ×4
GOWN STRL REUS W/TWL XL LVL3 (GOWN DISPOSABLE)
GUIDE MODEL REV SHLD LT (ORTHOPEDIC DISPOSABLE SUPPLIES) ×1 IMPLANT
HYDROGEN PEROXIDE 16OZ (MISCELLANEOUS) ×2 IMPLANT
JET LAVAGE IRRISEPT WOUND (IRRIGATION / IRRIGATOR) ×2
KIT BASIN OR (CUSTOM PROCEDURE TRAY) ×2 IMPLANT
KIT TURNOVER KIT B (KITS) ×2 IMPLANT
LAVAGE JET IRRISEPT WOUND (IRRIGATION / IRRIGATOR) ×1 IMPLANT
LOOP VESSEL MAXI BLUE (MISCELLANEOUS) ×2 IMPLANT
MANIFOLD NEPTUNE II (INSTRUMENTS) ×2 IMPLANT
NDL SUT 6 .5 CRC .975X.05 MAYO (NEEDLE) IMPLANT
NDL TAPERED W/ NITINOL LOOP (MISCELLANEOUS) ×1 IMPLANT
NEEDLE MAYO TAPER (NEEDLE)
NEEDLE TAPERED W/ NITINOL LOOP (MISCELLANEOUS) ×2 IMPLANT
NS IRRIG 1000ML POUR BTL (IV SOLUTION) ×2 IMPLANT
PACK SHOULDER (CUSTOM PROCEDURE TRAY) ×2 IMPLANT
PAD ARMBOARD 7.5X6 YLW CONV (MISCELLANEOUS) ×4 IMPLANT
PAD COLD SHLDR WRAP-ON (PAD) ×2 IMPLANT
PASSER SUT SWANSON 36MM LOOP (INSTRUMENTS) ×2 IMPLANT
PIN THREADED REVERSE (PIN) ×2 IMPLANT
REAMER GUIDE BUSHING SURG DISP (MISCELLANEOUS) ×1 IMPLANT
REAMER GUIDE W/SCREW AUG (MISCELLANEOUS) ×1 IMPLANT
RESTRAINT HEAD UNIVERSAL NS (MISCELLANEOUS) ×2 IMPLANT
SCREW BONE LOCKING 4.75X30X3.5 (Screw) ×1 IMPLANT
SCREW BONE STRL 6.5MMX25MM (Screw) ×1 IMPLANT
SCREW LOCKING 4.75MMX15MM (Screw) ×2 IMPLANT
SCREW LOCKING STRL 4.75X25X3.5 (Screw) ×1 IMPLANT
SLING ARM FOAM STRAP LRG (SOFTGOODS) ×1 IMPLANT
SLING ARM IMMOBILIZER LRG (SOFTGOODS) ×2 IMPLANT
SOL PREP POV-IOD 4OZ 10% (MISCELLANEOUS) ×2 IMPLANT
SPONGE T-LAP 18X18 ~~LOC~~+RFID (SPONGE) ×2 IMPLANT
STEM HUMERAL STRL 15MMX140MM (Stem) ×1 IMPLANT
STRIP CLOSURE SKIN 1/2X4 (GAUZE/BANDAGES/DRESSINGS) ×2 IMPLANT
SUCTION FRAZIER HANDLE 10FR (MISCELLANEOUS) ×2
SUCTION TUBE FRAZIER 10FR DISP (MISCELLANEOUS) ×1 IMPLANT
SUT BROADBAND TAPE 2PK 1.5 (SUTURE) ×2 IMPLANT
SUT FIBERWIRE #2 38 T-5 BLUE (SUTURE)
SUT MAXBRAID (SUTURE) IMPLANT
SUT MNCRL AB 3-0 PS2 18 (SUTURE) ×2 IMPLANT
SUT SILK 2 0 TIES 10X30 (SUTURE) ×2 IMPLANT
SUT VIC AB 0 CT1 27 (SUTURE) ×8
SUT VIC AB 0 CT1 27XBRD ANBCTR (SUTURE) ×4 IMPLANT
SUT VIC AB 1 CT1 27 (SUTURE) ×4
SUT VIC AB 1 CT1 27XBRD ANBCTR (SUTURE) ×2 IMPLANT
SUT VIC AB 2-0 CT1 27 (SUTURE) ×6
SUT VIC AB 2-0 CT1 TAPERPNT 27 (SUTURE) ×3 IMPLANT
SUT VICRYL 0 UR6 27IN ABS (SUTURE) ×4 IMPLANT
SUTURE FIBERWR #2 38 T-5 BLUE (SUTURE) IMPLANT
TOWEL GREEN STERILE (TOWEL DISPOSABLE) ×2 IMPLANT
TRAY FOL W/BAG SLVR 16FR STRL (SET/KITS/TRAYS/PACK) IMPLANT
TRAY FOLEY W/BAG SLVR 16FR LF (SET/KITS/TRAYS/PACK)
TRAY HUM MINI SHOULDER +3 40 (Joint) ×1 IMPLANT
TRAY HUM REV SHOULDER 36 +3 (Shoulder) ×1 IMPLANT
WATER STERILE IRR 1000ML POUR (IV SOLUTION) ×3 IMPLANT

## 2020-12-25 NOTE — Brief Op Note (Signed)
   12/25/2020  4:40 PM  PATIENT:  Casilda Carls  60 y.o. male  PRE-OPERATIVE DIAGNOSIS:  left shoulder rotator cuff arthropathy  POST-OPERATIVE DIAGNOSIS:  same  PROCEDURE:  Procedure(s): LEFT REVERSE SHOULDER REPLACEMENT  SURGEON:  Surgeon(s): Meredith Pel, MD  ASSISTANT: magnant pa  ANESTHESIA:   general  EBL: 75 ml    Total I/O In: 1500 [I.V.:1000; IV Piggyback:500] Out: 200 [Blood:200]  BLOOD ADMINISTERED: none  DRAINS: none   LOCAL MEDICATIONS USED:  none  SPECIMEN:  No Specimen  COUNTS:  YES  TOURNIQUET:  * No tourniquets in log *  DICTATION: .Other Dictation: Dictation Number [id number]  PLAN OF CARE: Admit for overnight observation  PATIENT DISPOSITION:  PACU - hemodynamically stable

## 2020-12-25 NOTE — Progress Notes (Signed)
Pacu RN Report to floor given  Gave report to  RN. [phone]. Discussed surgery, meds given in OR and Pac[facility], VS, IV fluids given, EBL, urine output, pain and other pertinent information. Also discussed if pt had any family or friends here or belongings with them. Patient has on a sling, he had a block. He is able to move fingers, able to feel touch to fingers, no pain.   Instructed pt to let RN know when he starts to have numbness and tingling/pain to arm/shoulder and that he should start to ask for pain medications before the block completely wears off and that pain is hard to get control of after it gets too high. He verbalized understanding.   Pt exits my care.

## 2020-12-25 NOTE — Anesthesia Procedure Notes (Signed)
Anesthesia Regional Block: Interscalene brachial plexus block   Pre-Anesthetic Checklist: , timeout performed,  Correct Patient, Correct Site, Correct Laterality,  Correct Procedure, Correct Position, site marked,  Risks and benefits discussed,  Pre-op evaluation,  At surgeon's request and post-op pain management  Laterality: Left  Prep: Maximum Sterile Barrier Precautions used, chloraprep       Needles:  Injection technique: Single-shot  Needle Type: Echogenic Stimulator Needle     Needle Length: 9cm  Needle Gauge: 22     Additional Needles:   Procedures:,,,, ultrasound used (permanent image in chart),,    Narrative:  Start time: 12/25/2020 11:42 AM End time: 12/25/2020 11:45 AM Injection made incrementally with aspirations every 5 mL.  Performed by: Personally  Anesthesiologist: Brennan Bailey, MD  Additional Notes: Risks, benefits, and alternative discussed. Patient gave consent for procedure. Patient prepped and draped in sterile fashion. Sedation administered, patient remains easily responsive to voice. Relevant anatomy identified with ultrasound guidance. Local anesthetic given in 5cc increments with no signs or symptoms of intravascular injection. No pain or paraesthesias with injection. Patient monitored throughout procedure with signs of LAST or immediate complications. Tolerated well. Ultrasound image placed in chart.  Tawny Asal, MD

## 2020-12-25 NOTE — Anesthesia Preprocedure Evaluation (Addendum)
Anesthesia Evaluation  Patient identified by MRN, date of birth, ID band Patient awake    Reviewed: Allergy & Precautions, NPO status , Patient's Chart, lab work & pertinent test results  History of Anesthesia Complications Negative for: history of anesthetic complications  Airway Mallampati: II  TM Distance: >3 FB Neck ROM: Full    Dental  (+) Missing,    Pulmonary asthma , Current Smoker,    Pulmonary exam normal        Cardiovascular hypertension, Pt. on medications Normal cardiovascular exam  TTE 07/2020: EF 60-65%, grade I DD, valves ok   Neuro/Psych Depression negative neurological ROS     GI/Hepatic negative GI ROS, (+) Hepatitis -, C  Endo/Other  diabetes, Type 2, Oral Hypoglycemic Agents  Renal/GU negative Renal ROS  negative genitourinary   Musculoskeletal  (+) Arthritis ,   Abdominal   Peds  Hematology negative hematology ROS (+)   Anesthesia Other Findings Day of surgery medications reviewed with patient.  Reproductive/Obstetrics negative OB ROS                            Anesthesia Physical Anesthesia Plan  ASA: 2  Anesthesia Plan: General   Post-op Pain Management: GA combined w/ Regional for post-op pain   Induction: Intravenous  PONV Risk Score and Plan: Treatment may vary due to age or medical condition, Ondansetron, Dexamethasone and Midazolam  Airway Management Planned: Oral ETT  Additional Equipment: None  Intra-op Plan:   Post-operative Plan: Extubation in OR  Informed Consent: I have reviewed the patients History and Physical, chart, labs and discussed the procedure including the risks, benefits and alternatives for the proposed anesthesia with the patient or authorized representative who has indicated his/her understanding and acceptance.     Dental advisory given  Plan Discussed with: CRNA  Anesthesia Plan Comments:        Anesthesia Quick  Evaluation

## 2020-12-25 NOTE — H&P (Signed)
Ethan Ellis is an 60 y.o. male.   Chief Complaint: left shoulder pain HPI: Ethan Ellis is a 60 year old patient with weakness and pain in his left shoulder.  Had rotator cuff surgery in 2020 and had a fall.  Work-up demonstrates recurrent rotator cuff tear retracted with atrophy of multiple rotator cuff muscles.  He has significant weakness and pain with activities of daily living.  He is tried rehabilitative exercises but to no avail.  He is a smoker and has diabetes but his hemoglobin A1c has been below 8.  No one at home.  He has been taking Norco patient had COVID over the summer which required cancellation of his surgery.  His symptoms have persisted and he presents now for operative management.  Past Medical History:  Diagnosis Date   Depression    Diabetes mellitus without complication ([place])    type 2   History of kidney stones    passed stone - no surgery   Hyperlipidemia 03/10/2014   Hypertension    LBP (low back pain) 2009   Right   Unspecified viral hepatitis C without hepatic coma 04/04/2015    Past Surgical History:  Procedure Laterality Date   APPENDECTOMY     BACK SURGERY     lower back    INGUINAL HERNIA REPAIR Left 04/26/2019   Procedure: LEFT INGUINAL HERNIA REPAIR WITH MESH;  Surgeon: Donnie Mesa, MD;  Location: [facility];  Service: General;  Laterality: Left;  LMA AND TAP BLOCK   LESION REMOVAL Left 04/26/2019   Procedure: EXCISION OF SKIN LESION LEFT GROIN AND BASE OF PENIS;  Surgeon: Donnie Mesa, MD;  Location: [facility];  Service: General;  Laterality: Left;   SHOULDER SURGERY     right - x 3 surgery    Family History  Problem Relation Age of Onset   Diabetes Father    Colon cancer Neg Hx    Colon polyps Neg Hx    Rectal cancer Neg Hx    Stomach cancer Neg Hx    Social History:  reports that he has been smoking cigarettes. He has a 46.00 pack-year smoking history. He has never used smokeless tobacco. He reports that he  does not currently use drugs. He reports that he does not drink alcohol.  Allergies:  Allergies  Allergen Reactions   Naproxen Swelling and Other (See Comments)    Mouth swelling- patient can take advil and some other NSAIDs w/o problems, however    Medications Prior to Admission  Medication Sig Dispense Refill   acetaminophen (TYLENOL) 650 MG CR tablet Take 1,950 mg by mouth 2 (two) times daily.     albuterol (VENTOLIN HFA) 108 (90 Base) MCG/ACT inhaler Inhale 2 puffs into the lungs every 6 (six) hours as needed for wheezing or shortness of breath. 54 g 3   aspirin EC 81 MG tablet Take 1 tablet (81 mg total) by mouth daily. 90 tablet 11   atorvastatin (LIPITOR) 10 MG tablet Take 1 tablet (10 mg total) by mouth daily. 90 tablet 3   Cholecalciferol (VITAMIN D-3 PO) Take 1 capsule by mouth daily.     citalopram (CELEXA) 20 MG tablet Take 1 tablet (20 mg total) by mouth daily. 90 tablet 3   glipiZIDE (GLUCOTROL XL) 10 MG 24 hr tablet Take 1 tablet by mouth once daily with breakfast (Patient taking differently: Take 10 mg by mouth daily with breakfast.) 90 tablet 3   hydrochlorothiazide (HYDRODIURIL) 25 MG tablet Take 1 tablet (  25 mg total) by mouth daily. 90 tablet 3   losartan (COZAAR) 100 MG tablet Take 1 tablet by mouth once daily (Patient taking differently: Take 100 mg by mouth daily.) 90 tablet 0   metFORMIN (GLUCOPHAGE) 500 MG tablet 2 tab by mouth in the AM, and 1 in the PM (Patient taking differently: Take 500-1,000 mg by mouth See admin instructions. Take 1,000 mg by mouth in the morning after breakfast and 500 mg with supper/evening meal) 270 tablet 3   nortriptyline (PAMELOR) 50 MG capsule Take 50 mg by mouth 3 (three) times daily.     tamsulosin (FLOMAX) 0.4 MG CAPS capsule Take 0.4 mg by mouth daily.     tiZANidine (ZANAFLEX) 4 MG tablet Take 12 mg by mouth at bedtime.     Blood Glucose Calibration (ACCU-CHEK GUIDE CONTROL) LIQD Use as directed once daily e11.9 1 each 5   Blood  Glucose Monitoring Suppl (ACCU-CHEK GUIDE ME) w/Device KIT Use as directed once daily E11.9 1 kit 0   canagliflozin (INVOKANA) 300 MG TABS tablet Take 1 tablet (300 mg total) by mouth daily before breakfast. (Patient not taking: No sig reported) 90 tablet 2   clotrimazole-betamethasone (LOTRISONE) cream Use as directed to affected area twice daily as needed (Patient not taking: No sig reported) 15 g 1   glucose blood (ACCU-CHEK GUIDE) test strip Use as instructed once daily E11.9 100 each 12   Lancets MISC Use as directed once daily to check blood sugar.  Diagnosis code E11.9 100 each 11    Results for orders placed or performed during the hospital encounter of 12/25/20 (from the past 48 hour(s))  Glucose, capillary     Status: None   Collection Time: 12/25/20 10:18 AM  Result Value Ref Range   Glucose-Capillary 95 70 - 99 mg/dL    Comment: Glucose reference range applies only to samples taken after fasting for at least 8 hours.   Comment 1 Notify RN    Comment 2 Document in Chart    No results found.  Review of Systems  Musculoskeletal:  Positive for arthralgias.  All other systems reviewed and are negative.  Blood pressure (!) 141/75, pulse 96, temperature 97.6 F (36.4 C), resp. rate 18, height 5' 9.5" (1.765 m), weight 82.6 kg, SpO2 96 %. Physical Exam Vitals reviewed.  HENT:     Head: Normocephalic.     Nose: Nose normal.     Mouth/Throat:     Mouth: Mucous membranes are moist.  Eyes:     Pupils: Pupils are equal, round, and reactive to light.  Cardiovascular:     Rate and Rhythm: Normal rate.     Pulses: Normal pulses.  Pulmonary:     Effort: Pulmonary effort is normal.  Abdominal:     General: Abdomen is flat.  Musculoskeletal:     Cervical back: Normal range of motion.  Skin:    General: Skin is warm.     Capillary Refill: Capillary refill takes less than 2 seconds.  Neurological:     General: No focal deficit present.     Mental Status: He is alert.   Psychiatric:        Mood and Affect: Mood normal.   Ortho exam demonstrates external rotation of 15 degrees of abduction to about 45 degrees bilaterally.  Does have pretty significant weakness to infraspinatus and supraspinatus testing at 3-5 on the right compared to 5- out of 5 on the left.  Isolated glenohumeral forward flexion and AB duction  just below 90 degrees.  Subscap strength 4- out of 5 on the right 5 out of 5 on the left EPL FPL interosseous strength is intact along with palpable radial pulse on the left-hand side  Assessment/Plan Impression is left shoulder rotator cuff arthropathy with no real chance that rotator cuff repair.  His only option even though his age is 52 would be reverse shoulder replacement.  Risk and benefits of the procedure discussed with the patient would not limited to infection nerve vessel damage dislocation lifting limit as well as possible need for revision in his lifetime.  Patient understands risk benefits and would like to proceed.  All questions answered.  Anderson Malta, MD 12/25/2020, 11:19 AM

## 2020-12-25 NOTE — Transfer of Care (Signed)
Immediate Anesthesia Transfer of Care Note  Patient: Ethan Ellis  Procedure(s) Performed: LEFT REVERSE SHOULDER REPLACEMENT (Left: Shoulder)  Patient Location: PACU  Anesthesia Type:General and Regional  Level of Consciousness: awake, alert , oriented and patient cooperative  Airway & Oxygen Therapy: Patient Spontanous Breathing and Patient connected to face mask oxygen  Post-op Assessment: Report given to RN, Post -op Vital signs reviewed and stable and Patient moving all extremities  Post vital signs: Reviewed and stable  Last Vitals:  Vitals Value Taken Time  BP 96/55 12/25/20 1635  Temp    Pulse 102 12/25/20 1638  Resp 19 12/25/20 1638  SpO2 98 % 12/25/20 1638  Vitals shown include unvalidated device data.  Last Pain:  Vitals:   12/25/20 1033  PainSc: 8       Patients Stated Pain Goal: 3 (81/19/14 7829)  Complications: No notable events documented.

## 2020-12-25 NOTE — Anesthesia Procedure Notes (Signed)
Procedure Name: Intubation Date/Time: 12/25/2020 12:45 PM Performed by: Ardyth Harps, CRNA Pre-anesthesia Checklist: Patient identified, Emergency Drugs available, Suction available and Patient being monitored Patient Re-evaluated:Patient Re-evaluated prior to induction Oxygen Delivery Method: Circle System Utilized Preoxygenation: Pre-oxygenation with 100% oxygen Induction Type: IV induction Ventilation: Mask ventilation without difficulty Laryngoscope Size: Mac and 4 Grade View: Grade III Tube type: Oral Tube size: 7.5 mm Number of attempts: 1 Airway Equipment and Method: Stylet and Oral airway Placement Confirmation: ETT inserted through vocal cords under direct vision, positive ETCO2 and breath sounds checked- equal and bilateral Secured at: 23 cm Tube secured with: Tape Dental Injury: Teeth and Oropharynx as per pre-operative assessment

## 2020-12-26 ENCOUNTER — Encounter (HOSPITAL_COMMUNITY): Payer: Self-pay | Admitting: Orthopedic Surgery

## 2020-12-26 DIAGNOSIS — Z79899 Other long term (current) drug therapy: Secondary | ICD-10-CM | POA: Diagnosis not present

## 2020-12-26 DIAGNOSIS — F17210 Nicotine dependence, cigarettes, uncomplicated: Secondary | ICD-10-CM | POA: Diagnosis not present

## 2020-12-26 DIAGNOSIS — E119 Type 2 diabetes mellitus without complications: Secondary | ICD-10-CM | POA: Diagnosis not present

## 2020-12-26 DIAGNOSIS — Z7984 Long term (current) use of oral hypoglycemic drugs: Secondary | ICD-10-CM | POA: Diagnosis not present

## 2020-12-26 DIAGNOSIS — M12812 Other specific arthropathies, not elsewhere classified, left shoulder: Secondary | ICD-10-CM | POA: Diagnosis not present

## 2020-12-26 DIAGNOSIS — M75102 Unspecified rotator cuff tear or rupture of left shoulder, not specified as traumatic: Secondary | ICD-10-CM | POA: Diagnosis not present

## 2020-12-26 DIAGNOSIS — I10 Essential (primary) hypertension: Secondary | ICD-10-CM | POA: Diagnosis not present

## 2020-12-26 DIAGNOSIS — Z7982 Long term (current) use of aspirin: Secondary | ICD-10-CM | POA: Diagnosis not present

## 2020-12-26 LAB — GLUCOSE, CAPILLARY
Glucose-Capillary: 150 mg/dL — ABNORMAL HIGH (ref 70–99)
Glucose-Capillary: 77 mg/dL (ref 70–99)

## 2020-12-26 MED ORDER — METHOCARBAMOL 500 MG PO TABS: 500.0000 mg | ORAL_TABLET | Freq: Three times a day (TID) | ORAL | 0 refills | Status: DC | PRN

## 2020-12-26 MED ORDER — OXYCODONE HCL 5 MG PO TABS: 5.0000 mg | ORAL_TABLET | ORAL | 0 refills | Status: DC | PRN

## 2020-12-26 NOTE — Plan of Care (Signed)
Patient alert and oriented, voiding adequately, skin clean, dry and intact without evidence of skin break down, or symptoms of complications - no redness or edema noted, only slight tenderness at site.  Patient states pain is manageable at time of discharge. Patient has an appointment with MD in 2 weeks 

## 2020-12-26 NOTE — Op Note (Signed)
NAMEANTRON, Ethan Ellis MEDICAL RECORD NO: [id number] ACCOUNT NO: [id number] DATE OF BIRTH: [birth year] FACILITY: [facility] LOCATION: [facility] PHYSICIAN: Yetta Barre. Marlou Sa, MD  Operative Report   DATE OF PROCEDURE: 12/25/2020  PREOPERATIVE DIAGNOSIS:  Left shoulder rotator cuff arthropathy.  POSTOPERATIVE DIAGNOSIS:  Left shoulder rotator cuff arthropathy.  PROCEDURE:  Left shoulder reverse shoulder replacement using Biomet small augmented baseplate with 1 central compression screw, 4 peripheral locking screws; 36+3 glenosphere with micro shoulder stem 15 x 55; and mini humeral tray standard thickness 40 mm  diameter +3 taper offset with +3 thickness 36 mm diameter bearing.  SURGEON:  Meredith Pel, MD  ASSISTANTS:  Annie Main and Alena Bills, MS3  INDICATIONS:  The patient is a 61 year old patient with severe left shoulder pain and dysfunction due to rotator cuff arthropathy who presents for operative management after explanation of risks and benefits.  DESCRIPTION OF PROCEDURE:  The patient was brought to the operating room where general endotracheal anesthesia was induced.  Preoperative antibiotics were administered.  The patient was placed in the beach chair position with the head in neutral  position.  Left shoulder was prescrubbed with hydrogen peroxide followed by alcohol and Betadine, which was allowed to air dry, prepped with ChloraPrep solution, draped in a sterile manner.  Ethan Ellis was used to cover the operative field and seal the  axilla.  Timeout was called.  Next, a deltopectoral approach was made.  Cephalic vein was mobilized medially.  Deltopectoral interval was developed.  Release of the anterior portion of the deltoid attachment was performed manually.  The biceps tendon had  already been torn and retracted.  Circumflex vessels were ligated.  Subacromial space was cleared of adhesions.  Axillary nerve was visualized, palpated, and protected at all times during the case.   Subscap was then detached, and the capsule was then  detached around to the 7 o'clock position on that left shoulder.  Using blunt dissection inferiorly, the capsule was dissected off 2 cm below the humeral neck.  Head was dislocated.  No evidence of infection. Significant rotator cuff arthropathy was  present.  Humeral head was reamed up to a size 12 and then broached.  Based on the mismatch between his cancellous bone proximally and shaft size distally, a micro stem was chosen.  Micro stem broaching performed up to size 15, which gave a very nice  fit.  Next, the broach was placed and the cap was placed.  The broach matched his native version, which was approximately 30 degrees.  At this time, osteophytes were removed circumferentially around the humerus.  Posterior retractor was placed.  Pec  tendon was released about 1.5 cm.  With retractors in position, circumferential labral release and resection was performed.  Care was taken to avoid injury to the axillary nerve.  Next, using the patient-specific instrumentation, the glenoid was reamed.   Good bleeding bone was encountered.  Superior reaming was performed in accordance with preoperative templating.  The baseplate was placed, and a central screw was placed with very good purchase obtained.  Four peripheral locking screws were placed.   Next, size was determined using trials to be optimized at 36+3 with 1.5 mm inferior offset.  This true glenosphere was placed and tapped into position.  With the broach in place, reduction was performed with the +3 trial, which gave very good stability  with full extension and adduction and forward motion on the shoulder along with good overhead motion with internal and external rotation.  Next, trial  components were removed on the humeral side. True components were placed after drilling through  drilling holes for subscap repair.  These holes were medialized due to diminished subscap mobility.  Subscap was mobilized  with release of the coracohumeral ligament to the base of the coracoid. Vancomycin powder and IrriSept solution were used in the  humeral canal.  IrriSept solution was used after the incision as well as after the arthrotomy.  With true components in position, same stability parameters were maintained.  Thorough irrigation was performed with 3 liters of pouring irrigation. IrriSept  solution and vancomycin powder were then again placed into the joint.  The deltopectoral interval was closed using #1 Vicryl suture followed by interrupted inverted 0 Vicryl suture, 2-0 Vicryl suture, and 3-0 Monocryl.  Steri-Strips and Aquacel dressing  applied.  The patient tolerated the procedure well without immediate complication.  Ethan Ellis's assistance was required at all times for retraction, mobilization of the tissue, opening, and closing.  His assistance was a medical necessity.   ROH D: 12/25/2020 4:46:52 pm T: 12/25/2020 11:58:00 pm  JOB: [id number]/ [id number]

## 2020-12-26 NOTE — Evaluation (Signed)
Physical Therapy Evaluation Patient Details Name: Ethan Ellis MRN: [id number] DOB: [birth year] Today's Date: 12/26/2020  History of Present Illness  60 year old patient with weakness and pain in his left shoulder.  S/p L TSA Reverse.  PMH includes: Depression, Diabetes mellitus, Hyperlipidemia, Hypertension.  Clinical Impression  Pt presents to PT with deficits in LUE strength and ROM due to L reverse total shoulder replacement. Pt is currently able to  mobilize independently, ambulating and negotiating stairs. PT provides explanation of shoulder precautions and sling use, with OT to follow for HEP and ADL management. At this time pt demonstrates no need for further acute PT intervention. Pt will benefit from outpatient PT once cleared by surgeon for further LUE mobility. Acute PT signing off.     Recommendations for follow up therapy are one component of a multi-disciplinary discharge planning process, led by the attending physician.  Recommendations may be updated based on patient status, additional functional criteria and insurance authorization.  Follow Up Recommendations No PT follow up    Equipment Recommendations  None recommended by PT    Recommendations for Other Services       Precautions / Restrictions Precautions Precautions: Shoulder Type of Shoulder Precautions: Reverse total shoulder precautions: limit AROM to L shoulder.  Elbow distal HEP. Shoulder Interventions: Shoulder sling/immobilizer;Off for dressing/bathing/exercises Precaution Booklet Issued: Yes (comment) Precaution Comments: PT issued, OT reviewed to ensure compliance. Restrictions Weight Bearing Restrictions: Yes LUE Weight Bearing: Non weight bearing Other Position/Activity Restrictions: Pillow behing shoulder in recliner and bed to prevent rotation at the shoulder.      Mobility  Bed Mobility Overal bed mobility: Independent             General bed mobility comments: Sitting EOB     Transfers Overall transfer level: Independent                  Ambulation/Gait Ambulation/Gait assistance: Independent Gait Distance (Feet): 300 Feet Assistive device: None Gait Pattern/deviations: WFL(Within Functional Limits) Gait velocity: functional Gait velocity interpretation: >2.62 ft/sec, indicative of community ambulatory General Gait Details: steady step-through gait, no balance deviations noted  Stairs Stairs: Yes Stairs assistance: Modified independent (Device/Increase time) Stair Management: One rail Right;Alternating pattern Number of Stairs: 4    Wheelchair Mobility    Modified Rankin (Stroke Patients Only)       Balance Overall balance assessment: Mild deficits observed, not formally tested                                           Pertinent Vitals/Pain Pain Assessment: 0-10 Pain Score: 5  Pain Location: L shoulder Pain Descriptors / Indicators: Operative site guarding Pain Intervention(s): Monitored during session    Home Living Family/patient expects to be discharged to:: Private residence Living Arrangements: Alone Available Help at Discharge: Friend(s);Available PRN/intermittently;Other (Comment) (SO that will assist as well.) Type of Home: House Home Access: Stairs to enter Entrance Stairs-Rails: Can reach both Entrance Stairs-Number of Steps: 3 Home Layout: One level Home Equipment: None Additional Comments: cane, walker, bedside commode, shower seat all available to utilize from neighbor    Prior Function Level of Independence: Independent               Hand Dominance   Dominant Hand: Right    Extremity/Trunk Assessment   Upper Extremity Assessment Upper Extremity Assessment: LUE deficits/detail;RUE deficits/detail RUE Deficits / Details: Overall WFL for  tasks assessed LUE Deficits / Details: s/p shoulder surgery.  Elbow distal WFL for AROM. LUE Sensation: decreased light touch LUE Coordination:  WNL    Lower Extremity Assessment Lower Extremity Assessment: Defer to PT evaluation    Cervical / Trunk Assessment Cervical / Trunk Assessment: Normal  Communication   Communication: No difficulties  Cognition Arousal/Alertness: Awake/alert Behavior During Therapy: WFL for tasks assessed/performed Overall Cognitive Status: Within Functional Limits for tasks assessed                                        General Comments General comments (skin integrity, edema, etc.): VSS on RA    Exercises General Exercises - Upper Extremity Elbow Flexion: AAROM;Left;Seated Elbow Extension: AAROM;Left;Seated Wrist Flexion: AROM;Left;Seated Wrist Extension: AROM;Left;Seated Digit Composite Flexion: AROM;Left;Seated Composite Extension: AROM;Left;Seated   Assessment/Plan    PT Assessment Patent does not need any further PT services  PT Problem List         PT Treatment Interventions      PT Goals (Current goals can be found in the Care Plan section)  Acute Rehab PT Goals Patient Stated Goal: Return home    Frequency     Barriers to discharge        Co-evaluation               AM-PAC PT "6 Clicks" Mobility  Outcome Measure Help needed turning from your back to your side while in a flat bed without using bedrails?: None Help needed moving from lying on your back to sitting on the side of a flat bed without using bedrails?: None Help needed moving to and from a bed to a chair (including a wheelchair)?: None Help needed standing up from a chair using your arms (e.g., wheelchair or bedside chair)?: None Help needed to walk in hospital room?: None Help needed climbing 3-5 steps with a railing? : None 6 Click Score: 24    End of Session Equipment Utilized During Treatment: Other (comment) (sling) Activity Tolerance: Patient tolerated treatment well Patient left: in bed;with call bell/phone within reach Nurse Communication: Mobility status PT Visit  Diagnosis: Muscle weakness (generalized) (M62.81)    Time: 2482-5003 PT Time Calculation (min) (ACUTE ONLY): 9 min   Charges:   PT Evaluation $PT Eval Low Complexity: [place], PT, DPT Acute Rehabilitation Pager: [phone] Office [phone]   Zenaida Niece 12/26/2020, 10:22 AM

## 2020-12-26 NOTE — Anesthesia Postprocedure Evaluation (Signed)
Anesthesia Post Note  Patient: Ethan Ellis  Procedure(s) Performed: LEFT REVERSE SHOULDER REPLACEMENT (Left: Shoulder)     Patient location during evaluation: PACU Anesthesia Type: General Level of consciousness: awake and alert and oriented Pain management: pain level controlled Vital Signs Assessment: post-procedure vital signs reviewed and stable Respiratory status: spontaneous breathing, nonlabored ventilation and respiratory function stable Cardiovascular status: blood pressure returned to baseline Postop Assessment: no apparent nausea or vomiting Anesthetic complications: no   No notable events documented.           Marthenia Rolling

## 2020-12-26 NOTE — Evaluation (Signed)
Occupational Therapy Evaluation Patient Details Name: Ethan Ellis MRN: [id number] DOB: 01-Jul-1960 Today's Date: 12/26/2020   History of Present Illness 60 year old patient with weakness and pain in his left shoulder.  S/p L TSA Reverse.  PMH includes: Depression, Diabetes mellitus, Hyperlipidemia, Hypertension.   Clinical Impression   Patient admitted for the diagnosis and procedure above.  PTA he lives alone, but has friends, neighbors, and a SO that can assist as needed.  L shoulder pain and shoulder restrictions are the deficit to independence.  Currently he is needing mild cues to prevent use of L shoulder during functional activities.  Reviewed all MD prescribed precautions, positioning of L shoulder in sitting and supine, sling use, HEP to L elbow distal as prescribed, and ADL completion.     Recommendations for follow up therapy are one component of a multi-disciplinary discharge planning process, led by the attending physician.  Recommendations may be updated based on patient status, additional functional criteria and insurance authorization.   Follow Up Recommendations  Follow surgeon's recommendation for DC plan and follow-up therapies    Equipment Recommendations  None recommended by OT    Recommendations for Other Services       Precautions / Restrictions Precautions Precautions: Shoulder Type of Shoulder Precautions: Reverse total shoulder precautions: limit AROM to L shoulder.  Elbow distal HEP. Shoulder Interventions: Shoulder sling/immobilizer;Off for dressing/bathing/exercises Precaution Booklet Issued: Yes (comment) Precaution Comments: PT issued, OT reviewed to ensure compliance. Restrictions Weight Bearing Restrictions: Yes LUE Weight Bearing: Non weight bearing Other Position/Activity Restrictions: Pillow behing shoulder in recliner and bed to prevent rotation at the shoulder.      Mobility Bed Mobility               General bed mobility comments:  Sitting EOB    Transfers Overall transfer level: Independent                    Balance Overall balance assessment: Mild deficits observed, not formally tested                                         ADL either performed or assessed with clinical judgement   ADL Overall ADL's : Modified independent                                       General ADL Comments: General cueing to limit Active use of L shoulder during dressing.     Vision Baseline Vision/History: 1 Wears glasses Patient Visual Report: No change from baseline       Perception     Praxis      Pertinent Vitals/Pain Pain Assessment: 0-10 Pain Score: 5  Pain Location: L shoulder Pain Descriptors / Indicators: Operative site guarding Pain Intervention(s): Monitored during session     Hand Dominance Right   Extremity/Trunk Assessment Upper Extremity Assessment Upper Extremity Assessment: LUE deficits/detail;RUE deficits/detail RUE Deficits / Details: Overall WFL for tasks assessed LUE Deficits / Details: s/p shoulder surgery.  Elbow distal WFL for AROM. LUE Sensation: decreased light touch LUE Coordination: WNL   Lower Extremity Assessment Lower Extremity Assessment: Defer to PT evaluation   Cervical / Trunk Assessment Cervical / Trunk Assessment: Normal   Communication Communication Communication: No difficulties   Cognition Arousal/Alertness: Awake/alert Behavior During Therapy: WFL for tasks  assessed/performed Overall Cognitive Status: Within Functional Limits for tasks assessed                                     General Comments       Exercises General Exercises - Upper Extremity Elbow Flexion: AAROM;Left;Seated Elbow Extension: AAROM;Left;Seated Wrist Flexion: AROM;Left;Seated Wrist Extension: AROM;Left;Seated Digit Composite Flexion: AROM;Left;Seated Composite Extension: AROM;Left;Seated   Shoulder Instructions      Home  Living Family/patient expects to be discharged to:: Private residence Living Arrangements: Alone Available Help at Discharge: Friend(s);Available PRN/intermittently;Other (Comment) (SO that will assist as well.) Type of Home: House       Home Layout: One level         Bathroom Toilet: Standard     Home Equipment: None          Prior Functioning/Environment Level of Independence: Independent                 OT Problem List: Pain      OT Treatment/Interventions:      OT Goals(Current goals can be found in the care plan section) Acute Rehab OT Goals Patient Stated Goal: Return home OT Goal Formulation: With patient Time For Goal Achievement: 12/26/20 Potential to Achieve Goals: Good  OT Frequency:     Barriers to D/C:  None noted          Co-evaluation              AM-PAC OT "6 Clicks" Daily Activity     Outcome Measure Help from another person eating meals?: None Help from another person taking care of personal grooming?: None Help from another person toileting, which includes using toliet, bedpan, or urinal?: None Help from another person bathing (including washing, rinsing, drying)?: None Help from another person to put on and taking off regular upper body clothing?: A Little Help from another person to put on and taking off regular lower body clothing?: None 6 Click Score: 23   End of Session    Activity Tolerance: Patient tolerated treatment well Patient left: in chair;with call bell/phone within reach  OT Visit Diagnosis: Pain Pain - Right/Left: Left Pain - part of body: Shoulder                Time: 4481-8563 OT Time Calculation (min): 23 min Charges:  OT General Charges $OT Visit: 1 Visit OT Evaluation $OT Eval Moderate Complexity: 1 Mod OT Treatments $Self Care/Home Management : 8-22 mins  12/26/2020  RP, OTR/L  Acute Rehabilitation Services  Office:  [phone]   Metta Clines 12/26/2020, 10:04 AM

## 2020-12-27 DIAGNOSIS — M12812 Other specific arthropathies, not elsewhere classified, left shoulder: Secondary | ICD-10-CM

## 2021-01-06 ENCOUNTER — Other Ambulatory Visit: Payer: Self-pay | Admitting: Internal Medicine

## 2021-01-06 NOTE — Telephone Encounter (Signed)
Please refill as per office routine med refill policy (all routine meds to be refilled for 3 mo or monthly (per pt preference) up to one year from last visit, then month to month grace period for 3 mo, then further med refills will have to be denied) ? ?

## 2021-01-07 ENCOUNTER — Encounter (HOSPITAL_COMMUNITY): Payer: Self-pay | Admitting: Orthopedic Surgery

## 2021-01-08 ENCOUNTER — Other Ambulatory Visit: Payer: Self-pay

## 2021-01-08 ENCOUNTER — Ambulatory Visit (INDEPENDENT_AMBULATORY_CARE_PROVIDER_SITE_OTHER): Payer: Medicare HMO | Admitting: Orthopedic Surgery

## 2021-01-08 ENCOUNTER — Encounter: Payer: Self-pay | Admitting: Orthopedic Surgery

## 2021-01-08 ENCOUNTER — Ambulatory Visit: Payer: Self-pay

## 2021-01-08 DIAGNOSIS — Z96612 Presence of left artificial shoulder joint: Secondary | ICD-10-CM | POA: Diagnosis not present

## 2021-01-08 MED ORDER — OXYCODONE HCL 5 MG PO TABS: 5.0000 mg | ORAL_TABLET | ORAL | 0 refills | Status: DC | PRN

## 2021-01-08 MED ORDER — METHOCARBAMOL 500 MG PO TABS: 500.0000 mg | ORAL_TABLET | Freq: Three times a day (TID) | ORAL | 0 refills | Status: DC | PRN

## 2021-01-08 NOTE — Progress Notes (Signed)
Post-Op Visit Note   Patient: Ethan Ellis           Date of Birth: 1960/04/05           MRN: [id number] Visit Date: 01/08/2021 PCP: Ethan Borg, MD   Assessment & Plan:  Chief Complaint:  Chief Complaint  Patient presents with   Left Shoulder - Routine Post Op   Visit Diagnoses:  1. S/P shoulder replacement, left     Plan: Ethan Ellis is a 60 year old patient is 2 weeks out left reverse shoulder replacements.  Overall doing reasonably well.  Using a home shoulder motion brace.  On exam deltoid fires.  Incision intact.  Has no redness around the incision.  He is got slight erythema around the posterior lateral aspect of the acromion which is 8 cm away from the incision.  Does not really look infected.  No lymphadenopathy present.  Plan is to discontinue sling.  No lifting more than 3 pounds.  Refill oxycodone and Robaxin.  4-week return.  Start physical therapy plus home exercise program.  Follow-Up Instructions: No follow-ups on file.   Orders:  Orders Placed This Encounter  Procedures   XR Shoulder Left   No orders of the defined types were placed in this encounter.   Imaging: XR Shoulder Left  Result Date: 01/08/2021 AP axillary outlet radiographs left shoulder reviewed.  Reverse shoulder replacement in good position alignment with no complicating features.  Shoulder is reduced.  Visualized lung fields clear.   PMFS History: Patient Active Problem List   Diagnosis Date Noted   Rotator cuff arthropathy, left    S/P reverse total shoulder arthroplasty, left 12/25/2020   Vitamin D deficiency 07/16/2020   Syncope 07/14/2020   Encounter for well adult exam with abnormal findings 01/08/2020   COVID-19 virus infection 01/07/2020   Encounter for screening for lung cancer 06/11/2019   Chronic low back pain 06/11/2019   Status post left rotator cuff repair 03/12/2019   Cholelithiasis 12/06/2018   Left inguinal hernia 12/06/2018   Weight loss 12/06/2018   Adenomatous  polyp 12/06/2018   Depression 12/06/2018   Urinary urgency 12/06/2018   Left upper arm pain 11/24/2017   Cervical radiculopathy 06/09/2017   Left lateral epicondylitis 05/23/2017   Lesion of penis 09/14/2016   Rash 09/14/2016   Asthma with acute exacerbation 01/27/2016   Allergic rhinitis 01/27/2016   Dyspnea 01/02/2016   Unspecified viral hepatitis C without hepatic coma 04/04/2015   Hyperlipidemia 03/10/2014   Uncontrolled hypertension 02/22/2014   Uncontrolled diabetes mellitus 02/22/2014   Tobacco dependence 12/30/2010   Fatigue 12/30/2010   Encounter for long-term (current) use of other medications 12/25/2010   Paresthesia 12/08/2010   FREQUENCY, URINARY 01/29/2009   Neoplasm of uncertain behavior of skin 04/24/2008   Cough 12/04/2007   LOW BACK PAIN 06/28/2007   PAIN IN SOFT TISSUES OF LIMB 06/19/2007   Past Medical History:  Diagnosis Date   Depression    Diabetes mellitus without complication ([place])    type 2   History of kidney stones    passed stone - no surgery   Hyperlipidemia 03/10/2014   Hypertension    LBP (low back pain) 2009   Right   Unspecified viral hepatitis C without hepatic coma 04/04/2015    Family History  Problem Relation Age of Onset   Diabetes Father    Colon cancer Neg Hx    Colon polyps Neg Hx    Rectal cancer Neg Hx    Stomach cancer  Neg Hx     Past Surgical History:  Procedure Laterality Date   APPENDECTOMY     BACK SURGERY     lower back    INGUINAL HERNIA REPAIR Left 04/26/2019   Procedure: LEFT INGUINAL HERNIA REPAIR WITH MESH;  Surgeon: Ethan Mesa, MD;  Location: [facility];  Service: General;  Laterality: Left;  LMA AND TAP BLOCK   LESION REMOVAL Left 04/26/2019   Procedure: EXCISION OF SKIN LESION LEFT GROIN AND BASE OF PENIS;  Surgeon: Ethan Mesa, MD;  Location: [facility];  Service: General;  Laterality: Left;   REVERSE SHOULDER ARTHROPLASTY Left 12/25/2020   Procedure: LEFT REVERSE  SHOULDER REPLACEMENT;  Surgeon: Ethan Pel, MD;  Location: [facility];  Service: Orthopedics;  Laterality: Left;   SHOULDER SURGERY     right - x 3 surgery   Social History   Occupational History   Not on file  Tobacco Use   Smoking status: Every Day    Packs/day: 1.00    Years: 46.00    Pack years: 46.00    Types: Cigarettes   Smokeless tobacco: Never  Vaping Use   Vaping Use: Never used  Substance and Sexual Activity   Alcohol use: No    Alcohol/week: 0.0 standard drinks   Drug use: Not Currently   Sexual activity: Yes

## 2021-01-14 NOTE — Discharge Summary (Signed)
Physician Discharge Summary      Patient ID: Ethan Ellis MRN: [id number] DOB/AGE: 06-22-1960 60 y.o.  Admit date: 12/25/2020 Discharge date: 12/26/20  Admission Diagnoses:  Active Problems:   S/P reverse total shoulder arthroplasty, left   Rotator cuff arthropathy, left   Discharge Diagnoses:  Same  Surgeries: Procedure(s): LEFT REVERSE SHOULDER REPLACEMENT on 12/25/2020   Consultants:   Discharged Condition: Stable  Hospital Course: Ethan Ellis is an 60 y.o. male who was admitted 12/25/2020 with a chief complaint of left shoulder pain, and found to have a diagnosis of left shoulder OA.  They were brought to the operating room on 12/25/2020 and underwent the above named procedures.  Pt awoke from anesthesia without complication and was transferred to the floor. On POD1, patients pain was controlled and he was cleared by PT.  He was discharged home on POD1.  Pt will f/u with Dr. Marlou Sa in clinic in ~2 weeks.   Antibiotics given:  Anti-infectives (From admission, onward)    Start     Dose/Rate Route Frequency Ordered Stop   12/25/20 1915  ceFAZolin (ANCEF) IVPB 2g/100 mL premix        2 g 200 mL/hr over 30 Minutes Intravenous Every 6 hours 12/25/20 1825 12/26/20 0656   12/25/20 1449  vancomycin (VANCOCIN) powder  Status:  Discontinued          As needed 12/25/20 1319 12/25/20 1631   12/25/20 1015  ceFAZolin (ANCEF) IVPB 2g/100 mL premix        2 g 200 mL/hr over 30 Minutes Intravenous On call to O.R. 12/25/20 1005 12/25/20 1247     .  Recent vital signs:  Vitals:   12/26/20 0721 12/26/20 1123  BP: 126/65 130/64  Pulse: 75 70  Resp: 18 18  Temp: (!) 97.4 F (36.3 C) (!) 97.5 F (36.4 C)  SpO2: 94% 95%    Recent laboratory studies:  Results for orders placed or performed during the hospital encounter of 12/25/20  Glucose, capillary  Result Value Ref Range   Glucose-Capillary 95 70 - 99 mg/dL   Comment 1 Notify RN    Comment 2 Document in Chart    Glucose, capillary  Result Value Ref Range   Glucose-Capillary 131 (H) 70 - 99 mg/dL  Glucose, capillary  Result Value Ref Range   Glucose-Capillary 241 (H) 70 - 99 mg/dL  Glucose, capillary  Result Value Ref Range   Glucose-Capillary 237 (H) 70 - 99 mg/dL   Comment 1 Notify RN    Comment 2 Document in Chart   Glucose, capillary  Result Value Ref Range   Glucose-Capillary 150 (H) 70 - 99 mg/dL   Comment 1 Notify RN    Comment 2 Document in Chart   Glucose, capillary  Result Value Ref Range   Glucose-Capillary 77 70 - 99 mg/dL    Discharge Medications:   Allergies as of 12/26/2020       Reactions   Naproxen Swelling, Other (See Comments)   Mouth swelling- patient can take advil and some other NSAIDs w/o problems, however        Medication List     TAKE these medications    Accu-Chek Guide Control Liqd Use as directed once daily e11.9   Accu-Chek Guide Me w/Device Kit Use as directed once daily E11.9   Accu-Chek Guide test strip Generic drug: glucose blood Use as instructed once daily E11.9   acetaminophen 650 MG CR tablet Commonly known as: TYLENOL Take 1,950 mg  by mouth 2 (two) times daily.   albuterol 108 (90 Base) MCG/ACT inhaler Commonly known as: VENTOLIN HFA Inhale 2 puffs into the lungs every 6 (six) hours as needed for wheezing or shortness of breath.   aspirin EC 81 MG tablet Take 1 tablet (81 mg total) by mouth daily.   atorvastatin 10 MG tablet Commonly known as: LIPITOR Take 1 tablet (10 mg total) by mouth daily.   canagliflozin 300 MG Tabs tablet Commonly known as: INVOKANA Take 1 tablet (300 mg total) by mouth daily before breakfast.   citalopram 20 MG tablet Commonly known as: CELEXA Take 1 tablet (20 mg total) by mouth daily.   clotrimazole-betamethasone cream Commonly known as: Lotrisone Use as directed to affected area twice daily as needed   glipiZIDE 10 MG 24 hr tablet Commonly known as: GLUCOTROL XL Take 1 tablet by  mouth once daily with breakfast What changed:  how much to take how to take this when to take this additional instructions   hydrochlorothiazide 25 MG tablet Commonly known as: HYDRODIURIL Take 1 tablet (25 mg total) by mouth daily.   Lancets Misc Use as directed once daily to check blood sugar.  Diagnosis code E11.9   metFORMIN 500 MG tablet Commonly known as: GLUCOPHAGE 2 tab by mouth in the AM, and 1 in the PM What changed:  how much to take how to take this when to take this additional instructions   methocarbamol 500 MG tablet Commonly known as: ROBAXIN Take 1 tablet (500 mg total) by mouth every 8 (eight) hours as needed for muscle spasms.   nortriptyline 50 MG capsule Commonly known as: PAMELOR Take 50 mg by mouth 3 (three) times daily.   oxyCODONE 5 MG immediate release tablet Commonly known as: Oxy IR/ROXICODONE Take 1 tablet (5 mg total) by mouth every 4 (four) hours as needed for moderate pain (pain score 4-6).   tamsulosin 0.4 MG Caps capsule Commonly known as: FLOMAX Take 0.4 mg by mouth daily.   tiZANidine 4 MG tablet Commonly known as: ZANAFLEX Take 12 mg by mouth at bedtime.   VITAMIN D-3 PO Take 1 capsule by mouth daily.        Diagnostic Studies: DG Shoulder Left Port  Result Date: 12/25/2020 CLINICAL DATA:  Status post left total shoulder arthroplasty. EXAM: LEFT SHOULDER COMPARISON:  None. FINDINGS: Single view radiograph the left shoulder demonstrates surgical changes of reverse total shoulder arthroplasty. Intra-articular gas is in keeping with recent surgical intervention. Mild degenerative changes are seen at the acromioclavicular articulation. No unexpected fracture or dislocation. Limited evaluation of the left hemithorax demonstrates mild left basilar atelectasis. IMPRESSION: Status post left reversed total shoulder arthroplasty. No unexpected fracture or dislocation. Electronically Signed   By: Fidela Salisbury M.D.   On: 12/25/2020 19:46    XR Shoulder Left  Result Date: 01/08/2021 AP axillary outlet radiographs left shoulder reviewed.  Reverse shoulder replacement in good position alignment with no complicating features.  Shoulder is reduced.  Visualized lung fields clear.   Disposition: Discharge disposition: 01-Home or Self Care       Discharge Instructions     Call MD / Call 911   Complete by: As directed    If you experience chest pain or shortness of breath, CALL 911 and be transported to the hospital emergency room.  If you develope a fever above 101 F, pus (white drainage) or increased drainage or redness at the wound, or calf pain, call your surgeon's office.   Constipation  Prevention   Complete by: As directed    Drink plenty of fluids.  Prune juice may be helpful.  You may use a stool softener, such as Colace (over the counter) 100 mg twice a day.  Use MiraLax (over the counter) for constipation as needed.   Diet - low sodium heart healthy   Complete by: As directed    Discharge instructions   Complete by: As directed    You may shower, dressing is waterproof.  Do not bathe or soak the operative shoulder in a tub, pool.  Use the CPM machine 3 times a day for one hour each time, increasing the degrees of range of motion daily.  No lifting with the operative shoulder. Continue use of the sling.  Follow-up with Dr. Marlou Sa in ~2 weeks on your given appointment date.  We will remove your adhesive bandage at that time.    Dental Antibiotics:  In most cases prophylactic antibiotics for Dental procdeures after total joint surgery are not necessary.  Exceptions are as follows:  1. History of prior total joint infection  2. Severely immunocompromised (Organ Transplant, cancer chemotherapy, Rheumatoid biologic meds such as [place])  3. Poorly controlled diabetes (A1C &gt; 8.0, blood glucose over 200)  If you have one of these conditions, contact your surgeon for an antibiotic prescription, prior to your dental  procedure.   Increase activity slowly as tolerated   Complete by: As directed    Post-operative opioid taper instructions:   Complete by: As directed    POST-OPERATIVE OPIOID TAPER INSTRUCTIONS: It is important to wean off of your opioid medication as soon as possible. If you do not need pain medication after your surgery it is ok to stop day one. Opioids include: Codeine, Hydrocodone(Norco, Vicodin), Oxycodone(Percocet, oxycontin) and hydromorphone amongst others.  Long term and even short term use of opiods can cause: Increased pain response Dependence Constipation Depression Respiratory depression And more.  Withdrawal symptoms can include Flu like symptoms Nausea, vomiting And more Techniques to manage these symptoms Hydrate well Eat regular healthy meals Stay active Use relaxation techniques(deep breathing, meditating, yoga) Do Not substitute Alcohol to help with tapering If you have been on opioids for less than two weeks and do not have pain than it is ok to stop all together.  Plan to wean off of opioids This plan should start within one week post op of your joint replacement. Maintain the same interval or time between taking each dose and first decrease the dose.  Cut the total daily intake of opioids by one tablet each day Next start to increase the time between doses. The last dose that should be eliminated is the evening dose.             SignedDonella Stade 01/14/2021, 9:24 PM

## 2021-01-27 DIAGNOSIS — H0102B Squamous blepharitis left eye, upper and lower eyelids: Secondary | ICD-10-CM | POA: Diagnosis not present

## 2021-01-27 DIAGNOSIS — H0102A Squamous blepharitis right eye, upper and lower eyelids: Secondary | ICD-10-CM | POA: Diagnosis not present

## 2021-01-27 DIAGNOSIS — Z0100 Encounter for examination of eyes and vision without abnormal findings: Secondary | ICD-10-CM | POA: Diagnosis not present

## 2021-01-27 DIAGNOSIS — H524 Presbyopia: Secondary | ICD-10-CM | POA: Diagnosis not present

## 2021-01-27 DIAGNOSIS — E119 Type 2 diabetes mellitus without complications: Secondary | ICD-10-CM | POA: Diagnosis not present

## 2021-01-27 DIAGNOSIS — H2513 Age-related nuclear cataract, bilateral: Secondary | ICD-10-CM | POA: Diagnosis not present

## 2021-01-27 LAB — HM DIABETES EYE EXAM

## 2021-01-28 DIAGNOSIS — G894 Chronic pain syndrome: Secondary | ICD-10-CM | POA: Diagnosis not present

## 2021-01-28 DIAGNOSIS — M5126 Other intervertebral disc displacement, lumbar region: Secondary | ICD-10-CM | POA: Diagnosis not present

## 2021-01-28 DIAGNOSIS — M545 Low back pain, unspecified: Secondary | ICD-10-CM | POA: Diagnosis not present

## 2021-02-04 ENCOUNTER — Encounter: Payer: Self-pay | Admitting: Orthopedic Surgery

## 2021-02-04 ENCOUNTER — Ambulatory Visit (INDEPENDENT_AMBULATORY_CARE_PROVIDER_SITE_OTHER): Payer: Medicare HMO | Admitting: Orthopedic Surgery

## 2021-02-04 ENCOUNTER — Other Ambulatory Visit: Payer: Self-pay

## 2021-02-04 DIAGNOSIS — Z96612 Presence of left artificial shoulder joint: Secondary | ICD-10-CM

## 2021-02-04 MED ORDER — OXYCODONE HCL 5 MG PO TABS: 5.0000 mg | ORAL_TABLET | Freq: Two times a day (BID) | ORAL | 0 refills | Status: DC | PRN

## 2021-02-04 MED ORDER — METHOCARBAMOL 500 MG PO TABS: 500.0000 mg | ORAL_TABLET | Freq: Two times a day (BID) | ORAL | 0 refills | Status: DC | PRN

## 2021-02-04 NOTE — Progress Notes (Signed)
Post-Op Visit Note   Patient: Ethan Ellis           Date of Birth: [birth year]           MRN: [id number] Visit Date: 02/04/2021 PCP: Ethan Borg, MD   Assessment & Plan:  Chief Complaint:  Chief Complaint  Patient presents with   Left Shoulder - Routine Post Op    12/25/20 (5w 6d) Left Reverse Shoulder Replacement - Left     Visit Diagnoses:  1. S/P shoulder replacement, left     Plan: Ethan Ellis is a 60 year old patient who is now about 5 weeks out left reverse shoulder replacement.  He is doing a home exercise program for strengthening and range of motion.  Does not really want to do therapy.  Hard for him to rake leaves in the yard.  On exam he is able to get his hand behind his head.  Passive range of motion on the left is 50/100/160.  Oxycodone muscle relaxer refilled but he is going to start tapering off that.  6-week return for final check.  Follow-Up Instructions: Return in about 6 weeks (around 03/18/2021).   Orders:  No orders of the defined types were placed in this encounter.  Meds ordered this encounter  Medications   oxyCODONE (OXY IR/ROXICODONE) 5 MG immediate release tablet    Sig: Take 1 tablet (5 mg total) by mouth every 12 (twelve) hours as needed for moderate pain (pain score 4-6).    Dispense:  30 tablet    Refill:  0   methocarbamol (ROBAXIN) 500 MG tablet    Sig: Take 1 tablet (500 mg total) by mouth every 12 (twelve) hours as needed for muscle spasms.    Dispense:  30 tablet    Refill:  0    Imaging: No results found.  PMFS History: Patient Active Problem List   Diagnosis Date Noted   Rotator cuff arthropathy, left    S/P reverse total shoulder arthroplasty, left 12/25/2020   Vitamin D deficiency 07/16/2020   Syncope 07/14/2020   Encounter for well adult exam with abnormal findings 01/08/2020   COVID-19 virus infection 01/07/2020   Encounter for screening for lung cancer 06/11/2019   Chronic low back pain 06/11/2019   Status post left  rotator cuff repair 03/12/2019   Cholelithiasis 12/06/2018   Left inguinal hernia 12/06/2018   Weight loss 12/06/2018   Adenomatous polyp 12/06/2018   Depression 12/06/2018   Urinary urgency 12/06/2018   Left upper arm pain 11/24/2017   Cervical radiculopathy 06/09/2017   Left lateral epicondylitis 05/23/2017   Lesion of penis 09/14/2016   Rash 09/14/2016   Asthma with acute exacerbation 01/27/2016   Allergic rhinitis 01/27/2016   Dyspnea 01/02/2016   Unspecified viral hepatitis C without hepatic coma 04/04/2015   Hyperlipidemia 03/10/2014   Uncontrolled hypertension 02/22/2014   Uncontrolled diabetes mellitus 02/22/2014   Tobacco dependence 12/30/2010   Fatigue 12/30/2010   Encounter for long-term (current) use of other medications 12/25/2010   Paresthesia 12/08/2010   FREQUENCY, URINARY 01/29/2009   Neoplasm of uncertain behavior of skin 04/24/2008   Cough 12/04/2007   LOW BACK PAIN 06/28/2007   PAIN IN SOFT TISSUES OF LIMB 06/19/2007   Past Medical History:  Diagnosis Date   Depression    Diabetes mellitus without complication ([place])    type 2   History of kidney stones    passed stone - no surgery   Hyperlipidemia 03/10/2014   Hypertension    LBP (low  back pain) 2009   Right   Unspecified viral hepatitis C without hepatic coma 04/04/2015    Family History  Problem Relation Age of Onset   Diabetes Father    Colon cancer Neg Hx    Colon polyps Neg Hx    Rectal cancer Neg Hx    Stomach cancer Neg Hx     Past Surgical History:  Procedure Laterality Date   APPENDECTOMY     BACK SURGERY     lower back    INGUINAL HERNIA REPAIR Left 04/26/2019   Procedure: LEFT INGUINAL HERNIA REPAIR WITH MESH;  Surgeon: Donnie Mesa, MD;  Location: [facility];  Service: General;  Laterality: Left;  LMA AND TAP BLOCK   LESION REMOVAL Left 04/26/2019   Procedure: EXCISION OF SKIN LESION LEFT GROIN AND BASE OF PENIS;  Surgeon: Donnie Mesa, MD;  Location: [facility];  Service: General;  Laterality: Left;   REVERSE SHOULDER ARTHROPLASTY Left 12/25/2020   Procedure: LEFT REVERSE SHOULDER REPLACEMENT;  Surgeon: Meredith Pel, MD;  Location: [facility];  Service: Orthopedics;  Laterality: Left;   SHOULDER SURGERY     right - x 3 surgery   Social History   Occupational History   Not on file  Tobacco Use   Smoking status: Every Day    Packs/day: 1.00    Years: 46.00    Pack years: 46.00    Types: Cigarettes   Smokeless tobacco: Never  Vaping Use   Vaping Use: Never used  Substance and Sexual Activity   Alcohol use: No    Alcohol/week: 0.0 standard drinks   Drug use: Not Currently   Sexual activity: Yes

## 2021-02-27 ENCOUNTER — Ambulatory Visit (INDEPENDENT_AMBULATORY_CARE_PROVIDER_SITE_OTHER): Payer: Medicare HMO | Admitting: Internal Medicine

## 2021-02-27 ENCOUNTER — Encounter: Payer: Self-pay | Admitting: Internal Medicine

## 2021-02-27 ENCOUNTER — Other Ambulatory Visit: Payer: Self-pay

## 2021-02-27 VITALS — BP 132/82 | HR 100 | Temp 98.1°F | Ht 69.5 in | Wt 184.0 lb

## 2021-02-27 DIAGNOSIS — E785 Hyperlipidemia, unspecified: Secondary | ICD-10-CM | POA: Diagnosis not present

## 2021-02-27 DIAGNOSIS — E1165 Type 2 diabetes mellitus with hyperglycemia: Secondary | ICD-10-CM | POA: Diagnosis not present

## 2021-02-27 DIAGNOSIS — I10 Essential (primary) hypertension: Secondary | ICD-10-CM

## 2021-02-27 DIAGNOSIS — M545 Low back pain, unspecified: Secondary | ICD-10-CM | POA: Diagnosis not present

## 2021-02-27 DIAGNOSIS — G8929 Other chronic pain: Secondary | ICD-10-CM

## 2021-02-27 DIAGNOSIS — E559 Vitamin D deficiency, unspecified: Secondary | ICD-10-CM

## 2021-02-27 DIAGNOSIS — F17200 Nicotine dependence, unspecified, uncomplicated: Secondary | ICD-10-CM | POA: Diagnosis not present

## 2021-02-27 MED ORDER — ATORVASTATIN CALCIUM 10 MG PO TABS: 10.0000 mg | ORAL_TABLET | Freq: Every day | ORAL | 3 refills | Status: DC

## 2021-02-27 MED ORDER — LOSARTAN POTASSIUM 100 MG PO TABS: 100.0000 mg | ORAL_TABLET | Freq: Every day | ORAL | 3 refills | Status: DC

## 2021-02-27 NOTE — Progress Notes (Signed)
Patient ID: Ethan Ellis, male   DOB: 11-25-1960, 60 y.o.   MRN: [id number]        Chief Complaint: follow up HTN, HLD and hyperglycemia, chronic back pain       HPI:  ZOHAIR Ellis is a 60 y.o. male here overall doing ok, nmentions his insurance no longer covers his provider for his nortryptilene and tizanidine, has been working well, asks me to provide ongoing rx, Pt continues to have recurring LBP without change in severity, bowel or bladder change, fever, wt loss,  worsening LE pain/numbness/weakness, gait change or falls.  Pt denies chest pain, increased sob or doe, wheezing, orthopnea, PND, increased LE swelling, palpitations, dizziness or syncope.   Pt denies polydipsia, polyuria, or new focal neuro s/s.   Not taking Vit D   Pt denies fever, wt loss, night sweats, loss of appetite, or other constitutional symptoms  No other new complaints  Left shoulder reverse shoulder surgury is quite successful per pt - has essentially little to no pain, though has some persistent expected decreased ROM       Wt Readings from Last 3 Encounters:  02/27/21 184 lb (83.5 kg)  12/25/20 182 lb (82.6 kg)  12/23/20 181 lb 9.6 oz (82.4 kg)   BP Readings from Last 3 Encounters:  02/27/21 132/82  12/26/20 130/64  12/23/20 132/87         Past Medical History:  Diagnosis Date   Depression    Diabetes mellitus without complication ([place])    type 2   History of kidney stones    passed stone - no surgery   Hyperlipidemia 03/10/2014   Hypertension    LBP (low back pain) 2009   Right   Unspecified viral hepatitis C without hepatic coma 04/04/2015   Past Surgical History:  Procedure Laterality Date   APPENDECTOMY     BACK SURGERY     lower back    INGUINAL HERNIA REPAIR Left 04/26/2019   Procedure: LEFT INGUINAL HERNIA REPAIR WITH MESH;  Surgeon: Ethan Ellis;  Location: [facility];  Service: General;  Laterality: Left;  LMA AND TAP BLOCK   LESION REMOVAL Left 04/26/2019   Procedure:  EXCISION OF SKIN LESION LEFT GROIN AND BASE OF PENIS;  Surgeon: Ethan Ellis;  Location: [facility];  Service: General;  Laterality: Left;   REVERSE SHOULDER ARTHROPLASTY Left 12/25/2020   Procedure: LEFT REVERSE SHOULDER REPLACEMENT;  Surgeon: Ethan Ellis;  Location: [facility];  Service: Orthopedics;  Laterality: Left;   SHOULDER SURGERY     right - x 3 surgery    reports that he has been smoking cigarettes. He has a 46.00 pack-year smoking history. He has never used smokeless tobacco. He reports that he does not currently use drugs. He reports that he does not drink alcohol. family history includes Diabetes in his father. Allergies  Allergen Reactions   Naproxen Swelling and Other (See Comments)    Mouth swelling- patient can take advil and some other NSAIDs w/o problems, however   Current Outpatient Medications on File Prior to Visit  Medication Sig Dispense Refill   acetaminophen (TYLENOL) 650 MG CR tablet Take 1,950 mg by mouth 2 (two) times daily.     albuterol (VENTOLIN HFA) 108 (90 Base) MCG/ACT inhaler Inhale 2 puffs into the lungs every 6 (six) hours as needed for wheezing or shortness of breath. 54 g 3   aspirin EC 81 MG tablet Take 1 tablet (81 mg  total) by mouth daily. 90 tablet 11   Blood Glucose Calibration (ACCU-CHEK GUIDE CONTROL) LIQD Use as directed once daily e11.9 1 each 5   Blood Glucose Monitoring Suppl (ACCU-CHEK GUIDE ME) w/Device KIT Use as directed once daily E11.9 1 kit 0   canagliflozin (INVOKANA) 300 MG TABS tablet Take 1 tablet (300 mg total) by mouth daily before breakfast. 90 tablet 2   Cholecalciferol (VITAMIN D-3 PO) Take 1 capsule by mouth daily.     citalopram (CELEXA) 20 MG tablet Take 1 tablet (20 mg total) by mouth daily. 90 tablet 3   clotrimazole-betamethasone (LOTRISONE) cream Use as directed to affected area twice daily as needed 15 g 1   glipiZIDE (GLUCOTROL XL) 10 MG 24 hr tablet Take 1 tablet by mouth once daily with  breakfast (Patient taking differently: Take 10 mg by mouth daily with breakfast.) 90 tablet 3   glucose blood (ACCU-CHEK GUIDE) test strip Use as instructed once daily E11.9 100 each 12   hydrochlorothiazide (HYDRODIURIL) 25 MG tablet Take 1 tablet (25 mg total) by mouth daily. 90 tablet 3   Lancets MISC Use as directed once daily to check blood sugar.  Diagnosis code E11.9 100 each 11   metFORMIN (GLUCOPHAGE) 500 MG tablet 2 tab by mouth in the AM, and 1 in the PM (Patient taking differently: Take 500-1,000 mg by mouth See admin instructions. Take 1,000 mg by mouth in the morning after breakfast and 500 mg with supper/evening meal) 270 tablet 3   methocarbamol (ROBAXIN) 500 MG tablet Take 1 tablet (500 mg total) by mouth every 8 (eight) hours as needed for muscle spasms. 30 tablet 0   methocarbamol (ROBAXIN) 500 MG tablet Take 1 tablet (500 mg total) by mouth every 12 (twelve) hours as needed for muscle spasms. 30 tablet 0   nortriptyline (PAMELOR) 50 MG capsule Take 50 mg by mouth 3 (three) times daily.     tamsulosin (FLOMAX) 0.4 MG CAPS capsule Take 0.4 mg by mouth daily.     tiZANidine (ZANAFLEX) 4 MG tablet Take 12 mg by mouth at bedtime.     No current facility-administered medications on file prior to visit.        ROS:  All others reviewed and negative.  Objective        PE:  BP 132/82    Pulse 100    Temp 98.1 F (36.7 C)    Ht 5' 9.5" (1.765 m)    Wt 184 lb (83.5 kg)    SpO2 94%    BMI 26.78 kg/m                 Constitutional: Pt appears in NAD               HENT: Head: NCAT.                Right Ear: External ear normal.                 Left Ear: External ear normal.                Eyes: . Pupils are equal, round, and reactive to light. Conjunctivae and EOM are normal               Nose: without d/c or deformity               Neck: Neck supple. Gross normal ROM  Cardiovascular: Normal rate and regular rhythm.                 Pulmonary/Chest: Effort normal and  breath sounds without rales or wheezing.                Abd:  Soft, NT, ND, + BS, no organomegaly               Neurological: Pt is alert. At baseline orientation, motor grossly intact               Skin: Skin is warm. No rashes, no other new lesions, LE edema - none               Psychiatric: Pt behavior is normal without agitation   Micro: none  Cardiac tracings I have personally interpreted today:  none  Pertinent Radiological findings (summarize): none   Lab Results  Component Value Date   WBC 12.4 (H) 12/23/2020   HGB 15.7 12/23/2020   HCT 46.2 12/23/2020   PLT 276 12/23/2020   GLUCOSE 119 (H) 02/27/2021   CHOL 156 02/27/2021   TRIG 250 (H) 02/27/2021   HDL 46 02/27/2021   LDLDIRECT 91.0 07/14/2020   LDLCALC 77 02/27/2021   ALT 12 02/27/2021   AST 11 02/27/2021   NA 140 02/27/2021   K 4.2 02/27/2021   CL 102 02/27/2021   CREATININE 0.83 02/27/2021   BUN 12 02/27/2021   CO2 28 02/27/2021   TSH 0.65 07/14/2020   PSA 1.41 07/14/2020   HGBA1C 6.1 (H) 02/27/2021   MICROALBUR 0.8 07/14/2020   Assessment/Plan:  JEFFERSON FULLAM is a 60 y.o. White or Caucasian [1] male with  has a past medical history of Depression, Diabetes mellitus without complication ([place]), History of kidney stones, Hyperlipidemia (03/10/2014), Hypertension, LBP (low back pain) (2009), and Unspecified viral hepatitis C without hepatic coma (04/04/2015).  Vitamin D deficiency Last vitamin D Lab Results  Component Value Date   VD25OH 37 (L) 02/27/2021   Low to start oral replacement   Tobacco dependence Pt counseled to quit, pt not ready  Uncontrolled hypertension BP Readings from Last 3 Encounters:  02/27/21 132/82  12/26/20 130/64  12/23/20 132/87   Stable, pt to continue medical treatment  - hct   Diabetes ([place]) Lab Results  Component Value Date   HGBA1C 6.1 (H) 02/27/2021   Stable, pt to continue current medical treatment metformin, glpizide, invokana   Hyperlipidemia Lab Results   Component Value Date   LDLCALC 77 02/27/2021   Uncontrolled, goal ldl < 70,, pt to continue current statin lipitor 10 as declines increase, also to work on DM low chol diet.   Chronic low back pain Overall stable, for med refills as request,  to f/u any worsening symptoms or concerns  Followup: Return in about 6 months (around 08/28/2021).  Cathlean Cower, Ellis 02/28/2021 6:04 AM [facility]

## 2021-02-27 NOTE — Patient Instructions (Signed)

## 2021-02-28 ENCOUNTER — Encounter: Payer: Self-pay | Admitting: Internal Medicine

## 2021-02-28 LAB — HEPATIC FUNCTION PANEL
AG Ratio: 2 (calc) (ref 1.0–2.5)
ALT: 12 U/L (ref 9–46)
AST: 11 U/L (ref 10–35)
Albumin: 4.5 g/dL (ref 3.6–5.1)
Alkaline phosphatase (APISO): 80 U/L (ref 35–144)
Bilirubin, Direct: 0.1 mg/dL (ref 0.0–0.2)
Globulin: 2.3 g/dL (calc) (ref 1.9–3.7)
Indirect Bilirubin: 0.4 mg/dL (calc) (ref 0.2–1.2)
Total Bilirubin: 0.5 mg/dL (ref 0.2–1.2)
Total Protein: 6.8 g/dL (ref 6.1–8.1)

## 2021-02-28 LAB — HEMOGLOBIN A1C
Hgb A1c MFr Bld: 6.1 % of total Hgb — ABNORMAL HIGH (ref <5.7)
Mean Plasma Glucose: 128 mg/dL
eAG (mmol/L): 7.1 mmol/L

## 2021-02-28 LAB — BASIC METABOLIC PANEL
BUN: 12 mg/dL (ref 7–25)
CO2: 28 mmol/L (ref 20–32)
Calcium: 9.4 mg/dL (ref 8.6–10.3)
Chloride: 102 mmol/L (ref 98–110)
Creat: 0.83 mg/dL (ref 0.70–1.35)
Glucose, Bld: 119 mg/dL — ABNORMAL HIGH (ref 65–99)
Potassium: 4.2 mmol/L (ref 3.5–5.3)
Sodium: 140 mmol/L (ref 135–146)

## 2021-02-28 LAB — VITAMIN D 25 HYDROXY (VIT D DEFICIENCY, FRACTURES): Vit D, 25-Hydroxy: 27 ng/mL — ABNORMAL LOW (ref 30–100)

## 2021-02-28 LAB — LIPID PANEL
Cholesterol: 156 mg/dL (ref <200)
HDL: 46 mg/dL (ref >=40)
LDL Cholesterol (Calc): 77 mg/dL (calc)
Non-HDL Cholesterol (Calc): 110 mg/dL (calc) (ref <130)
Total CHOL/HDL Ratio: 3.4 (calc) (ref <5.0)
Triglycerides: 250 mg/dL — ABNORMAL HIGH (ref <150)

## 2021-02-28 MED ORDER — LOSARTAN POTASSIUM 100 MG PO TABS: 100.0000 mg | ORAL_TABLET | Freq: Every day | ORAL | 3 refills | Status: DC

## 2021-02-28 MED ORDER — NORTRIPTYLINE HCL 50 MG PO CAPS: 50.0000 mg | ORAL_CAPSULE | Freq: Three times a day (TID) | ORAL | 1 refills | Status: DC

## 2021-02-28 MED ORDER — HYDROCHLOROTHIAZIDE 25 MG PO TABS: 25.0000 mg | ORAL_TABLET | Freq: Every day | ORAL | 3 refills | Status: DC

## 2021-02-28 MED ORDER — METFORMIN HCL 500 MG PO TABS: ORAL_TABLET | ORAL | 3 refills | Status: DC

## 2021-02-28 MED ORDER — CANAGLIFLOZIN 300 MG PO TABS
300.0000 mg | ORAL_TABLET | Freq: Every day | ORAL | 3 refills | Status: DC
Start: 2021-02-28 — End: 2021-11-18

## 2021-02-28 MED ORDER — GLIPIZIDE ER 10 MG PO TB24: ORAL_TABLET | ORAL | 3 refills | Status: DC

## 2021-02-28 MED ORDER — TAMSULOSIN HCL 0.4 MG PO CAPS: 0.4000 mg | ORAL_CAPSULE | Freq: Every day | ORAL | 3 refills | Status: DC

## 2021-02-28 MED ORDER — ATORVASTATIN CALCIUM 10 MG PO TABS: 10.0000 mg | ORAL_TABLET | Freq: Every day | ORAL | 3 refills | Status: DC

## 2021-02-28 MED ORDER — CITALOPRAM HYDROBROMIDE 20 MG PO TABS: 20.0000 mg | ORAL_TABLET | Freq: Every day | ORAL | 3 refills | Status: DC

## 2021-02-28 MED ORDER — TIZANIDINE HCL 4 MG PO TABS: ORAL_TABLET | ORAL | 1 refills | Status: DC

## 2021-02-28 NOTE — Assessment & Plan Note (Signed)
Overall stable, for med refills as request,  to f/u any worsening symptoms or concerns

## 2021-02-28 NOTE — Assessment & Plan Note (Signed)
BP Readings from Last 3 Encounters:  02/27/21 132/82  12/26/20 130/64  12/23/20 132/87   Stable, pt to continue medical treatment  - hct

## 2021-02-28 NOTE — Assessment & Plan Note (Signed)
Lab Results  Component Value Date   LDLCALC 77 02/27/2021   Uncontrolled, goal ldl < 70,, pt to continue current statin lipitor 10 as declines increase, also to work on DM low chol diet.

## 2021-02-28 NOTE — Assessment & Plan Note (Signed)
Last vitamin D Lab Results  Component Value Date   VD25OH 27 (L) 02/27/2021   Low to start oral replacement

## 2021-02-28 NOTE — Assessment & Plan Note (Signed)
Pt counseled to quit, pt not ready 

## 2021-02-28 NOTE — Assessment & Plan Note (Signed)
Lab Results  Component Value Date   HGBA1C 6.1 (H) 02/27/2021   Stable, pt to continue current medical treatment metformin, glpizide, invokana

## 2021-03-13 ENCOUNTER — Other Ambulatory Visit: Payer: Self-pay | Admitting: Internal Medicine

## 2021-03-13 NOTE — Telephone Encounter (Signed)
Please refill as per office routine med refill policy (all routine meds to be refilled for 3 mo or monthly (per pt preference) up to one year from last visit, then month to month grace period for 3 mo, then further med refills will have to be denied) ? ?

## 2021-03-18 ENCOUNTER — Encounter: Payer: Medicare HMO | Admitting: Orthopedic Surgery

## 2021-04-03 ENCOUNTER — Encounter: Payer: Self-pay | Admitting: Orthopedic Surgery

## 2021-04-03 ENCOUNTER — Other Ambulatory Visit: Payer: Self-pay

## 2021-04-03 ENCOUNTER — Ambulatory Visit (INDEPENDENT_AMBULATORY_CARE_PROVIDER_SITE_OTHER): Payer: No Typology Code available for payment source | Admitting: Surgical

## 2021-04-03 DIAGNOSIS — Z96612 Presence of left artificial shoulder joint: Secondary | ICD-10-CM

## 2021-04-03 MED ORDER — AMOXICILLIN 500 MG PO TABS: 2000.0000 mg | ORAL_TABLET | Freq: Once | ORAL | 0 refills | Status: AC

## 2021-04-03 NOTE — Progress Notes (Signed)
Post-Op Visit Note   Patient: Ethan Ellis           Date of Birth: 1960-08-22           MRN: [id number] Visit Date: 04/03/2021 PCP: Biagio Borg, MD   Assessment & Plan:  Chief Complaint:  Chief Complaint  Patient presents with   Left Shoulder - Follow-up    12/25/20 (67m 7d) Left Reverse Shoulder Replacement     Visit Diagnoses:  1. S/P shoulder replacement, left     Plan: Patient is a 61 year old male who presents s/p left reverse shoulder arthroplasty on 12/25/2020.  He reports that he is doing well.  Left shoulder pain is well controlled with just occasional Tylenol use.  He has been doing physical therapy and home by himself.  He sleeps well at night aside from back and hip pain but nothing from the shoulder wakes him up at night.  He reports excellent range of motion and has no difficulty with using his arm except for pulling objects toward him.  He notes occasional decree strength with this point sensation but overall he is happy and satisfied with how his shoulder is doing.  On exam, incision is well-healed with no evidence of infection or dehiscence.  Axillary nerve intact with deltoid firing.  Subscap strength rated 5/5.  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  He has tenderness over the proximal aspect of the Popeye deformity.  Range of motion 50 degrees external rotation, 100 degrees abduction, 160 degrees forward flexion.  Active range of motion equivalent to passive range of motion.  No grinding or crepitus noted with passive motion of the shoulder.  Plan is to have patient follow-up with the office as needed.  Counseled on dental prophylaxis and prescription was sent in as he has a dentist appointment next month.  The Popeye deformity may be responsible for the pain with pulling toward him but he was counseled that there will be no loss of function if that bicep tenodesis site  has torn and over the next several months he should improve in regards to pain control.  He will follow-up with the office with any further concerns.  Follow-Up Instructions: No follow-ups on file.   Orders:  No orders of the defined types were placed in this encounter.  Meds ordered this encounter  Medications   amoxicillin (AMOXIL) 500 MG tablet    Sig: Take 4 tablets (2,000 mg total) by mouth once for 1 dose. Take 30-60 mins prior to dental appointment.    Dispense:  8 tablet    Refill:  0    Imaging: No results found.  PMFS History: Patient Active Problem List   Diagnosis Date Noted   Rotator cuff arthropathy, left    S/P reverse total shoulder arthroplasty, left 12/25/2020   Vitamin D deficiency 07/16/2020   Syncope 07/14/2020   Encounter for well adult exam with abnormal findings 01/08/2020   COVID-19 virus infection 01/07/2020   Encounter for screening for lung cancer 06/11/2019   Chronic low back pain 06/11/2019   Status post left rotator cuff repair 03/12/2019   Cholelithiasis 12/06/2018   Left inguinal hernia 12/06/2018   Weight loss 12/06/2018   Adenomatous polyp 12/06/2018   Depression 12/06/2018   Urinary urgency 12/06/2018   Left upper arm pain 11/24/2017   Cervical radiculopathy 06/09/2017   Left lateral epicondylitis 05/23/2017   Lesion  of penis 09/14/2016   Rash 09/14/2016   Asthma with acute exacerbation 01/27/2016   Allergic rhinitis 01/27/2016   Dyspnea 01/02/2016   Unspecified viral hepatitis C without hepatic coma 04/04/2015   Hyperlipidemia 03/10/2014   Uncontrolled hypertension 02/22/2014   Diabetes ([place]) 02/22/2014   Tobacco dependence 12/30/2010   Fatigue 12/30/2010   Encounter for long-term (current) use of other medications 12/25/2010   Paresthesia 12/08/2010   FREQUENCY, URINARY 01/29/2009   Neoplasm of uncertain behavior of skin 04/24/2008   Cough 12/04/2007   LOW BACK PAIN 06/28/2007   PAIN IN SOFT TISSUES OF LIMB 06/19/2007    Past Medical History:  Diagnosis Date   Depression    Diabetes mellitus without complication ([place])    type 2   History of kidney stones    passed stone - no surgery   Hyperlipidemia 03/10/2014   Hypertension    LBP (low back pain) 2009   Right   Unspecified viral hepatitis C without hepatic coma 04/04/2015    Family History  Problem Relation Age of Onset   Diabetes Father    Colon cancer Neg Hx    Colon polyps Neg Hx    Rectal cancer Neg Hx    Stomach cancer Neg Hx     Past Surgical History:  Procedure Laterality Date   APPENDECTOMY     BACK SURGERY     lower back    INGUINAL HERNIA REPAIR Left 04/26/2019   Procedure: LEFT INGUINAL HERNIA REPAIR WITH MESH;  Surgeon: Donnie Mesa, MD;  Location: [facility];  Service: General;  Laterality: Left;  LMA AND TAP BLOCK   LESION REMOVAL Left 04/26/2019   Procedure: EXCISION OF SKIN LESION LEFT GROIN AND BASE OF PENIS;  Surgeon: Donnie Mesa, MD;  Location: [facility];  Service: General;  Laterality: Left;   REVERSE SHOULDER ARTHROPLASTY Left 12/25/2020   Procedure: LEFT REVERSE SHOULDER REPLACEMENT;  Surgeon: Meredith Pel, MD;  Location: [facility];  Service: Orthopedics;  Laterality: Left;   SHOULDER SURGERY     right - x 3 surgery   Social History   Occupational History   Not on file  Tobacco Use   Smoking status: Every Day    Packs/day: 1.00    Years: 46.00    Pack years: 46.00    Types: Cigarettes   Smokeless tobacco: Never  Vaping Use   Vaping Use: Never used  Substance and Sexual Activity   Alcohol use: No    Alcohol/week: 0.0 standard drinks   Drug use: Not Currently   Sexual activity: Yes

## 2021-08-02 ENCOUNTER — Other Ambulatory Visit: Payer: Self-pay | Admitting: Internal Medicine

## 2021-08-05 ENCOUNTER — Telehealth: Payer: Self-pay

## 2021-08-05 MED ORDER — TAMSULOSIN HCL 0.4 MG PO CAPS: 0.4000 mg | ORAL_CAPSULE | Freq: Every day | ORAL | 0 refills | Status: DC

## 2021-08-05 MED ORDER — TIZANIDINE HCL 4 MG PO TABS: ORAL_TABLET | ORAL | 0 refills | Status: DC

## 2021-08-05 MED ORDER — CITALOPRAM HYDROBROMIDE 20 MG PO TABS: 20.0000 mg | ORAL_TABLET | Freq: Every day | ORAL | 0 refills | Status: DC

## 2021-08-05 NOTE — Telephone Encounter (Signed)
Sent a 30 day supply to [facility] until appt 09/02/21...Johny Chess

## 2021-08-05 NOTE — Telephone Encounter (Signed)
Pt is requesting a refill on: citalopram (CELEXA) 20 MG tablet tiZANidine (ZANAFLEX) 4 MG tablet tamsulosin (FLOMAX) 0.4 MG CAPS capsule  Pharmacy: [facility], [place] 02/27/21 ROV 09/02/21

## 2021-09-02 ENCOUNTER — Ambulatory Visit: Payer: Medicare HMO | Admitting: Internal Medicine

## 2021-09-08 ENCOUNTER — Encounter: Payer: Self-pay | Admitting: Internal Medicine

## 2021-09-08 ENCOUNTER — Ambulatory Visit (INDEPENDENT_AMBULATORY_CARE_PROVIDER_SITE_OTHER): Payer: No Typology Code available for payment source | Admitting: Internal Medicine

## 2021-09-08 VITALS — BP 128/68 | HR 83 | Temp 98.6°F | Ht 69.5 in | Wt 175.0 lb

## 2021-09-08 DIAGNOSIS — I10 Essential (primary) hypertension: Secondary | ICD-10-CM

## 2021-09-08 DIAGNOSIS — E559 Vitamin D deficiency, unspecified: Secondary | ICD-10-CM

## 2021-09-08 DIAGNOSIS — E1165 Type 2 diabetes mellitus with hyperglycemia: Secondary | ICD-10-CM | POA: Diagnosis not present

## 2021-09-08 DIAGNOSIS — E785 Hyperlipidemia, unspecified: Secondary | ICD-10-CM

## 2021-09-08 DIAGNOSIS — Z86010 Personal history of colonic polyps: Secondary | ICD-10-CM | POA: Diagnosis not present

## 2021-09-08 DIAGNOSIS — M1712 Unilateral primary osteoarthritis, left knee: Secondary | ICD-10-CM

## 2021-09-08 DIAGNOSIS — F17200 Nicotine dependence, unspecified, uncomplicated: Secondary | ICD-10-CM

## 2021-09-08 DIAGNOSIS — Z125 Encounter for screening for malignant neoplasm of prostate: Secondary | ICD-10-CM

## 2021-09-08 DIAGNOSIS — Z0001 Encounter for general adult medical examination with abnormal findings: Secondary | ICD-10-CM | POA: Diagnosis not present

## 2021-09-08 DIAGNOSIS — S46112A Strain of muscle, fascia and tendon of long head of biceps, left arm, initial encounter: Secondary | ICD-10-CM | POA: Diagnosis not present

## 2021-09-08 DIAGNOSIS — E538 Deficiency of other specified B group vitamins: Secondary | ICD-10-CM

## 2021-09-08 LAB — HEPATIC FUNCTION PANEL
ALT: 15 U/L (ref 0–53)
AST: 9 U/L (ref 0–37)
Albumin: 4.7 g/dL (ref 3.5–5.2)
Alkaline Phosphatase: 83 U/L (ref 39–117)
Bilirubin, Direct: 0.2 mg/dL (ref 0.0–0.3)
Total Bilirubin: 1.2 mg/dL (ref 0.2–1.2)
Total Protein: 6.9 g/dL (ref 6.0–8.3)

## 2021-09-08 LAB — CBC WITH DIFFERENTIAL/PLATELET
Basophils Absolute: 0 10*3/uL (ref 0.0–0.1)
Basophils Relative: 0.4 % (ref 0.0–3.0)
Eosinophils Absolute: 0.2 10*3/uL (ref 0.0–0.7)
Eosinophils Relative: 2 % (ref 0.0–5.0)
HCT: 45.6 % (ref 39.0–52.0)
Hemoglobin: 15.6 g/dL (ref 13.0–17.0)
Lymphocytes Relative: 24.3 % (ref 12.0–46.0)
Lymphs Abs: 2.6 10*3/uL (ref 0.7–4.0)
MCHC: 34.1 g/dL (ref 30.0–36.0)
MCV: 95.1 fl (ref 78.0–100.0)
Monocytes Absolute: 0.5 10*3/uL (ref 0.1–1.0)
Monocytes Relative: 4.8 % (ref 3.0–12.0)
Neutro Abs: 7.4 10*3/uL (ref 1.4–7.7)
Neutrophils Relative %: 68.5 % (ref 43.0–77.0)
Platelets: 230 10*3/uL (ref 150.0–400.0)
RBC: 4.79 Mil/uL (ref 4.22–5.81)
RDW: 12.8 % (ref 11.5–15.5)
WBC: 10.8 10*3/uL — ABNORMAL HIGH (ref 4.0–10.5)

## 2021-09-08 LAB — MICROALBUMIN / CREATININE URINE RATIO
Creatinine,U: 80.3 mg/dL
Microalb Creat Ratio: 0.9 mg/g (ref 0.0–30.0)
Microalb, Ur: <0.7 mg/dL (ref 0.0–1.9)

## 2021-09-08 LAB — BASIC METABOLIC PANEL
BUN: 16 mg/dL (ref 6–23)
CO2: 27 mEq/L (ref 19–32)
Calcium: 9.6 mg/dL (ref 8.4–10.5)
Chloride: 100 mEq/L (ref 96–112)
Creatinine, Ser: 0.97 mg/dL (ref 0.40–1.50)
GFR: 83.12 mL/min (ref >60.00)
Glucose, Bld: 69 mg/dL — ABNORMAL LOW (ref 70–99)
Potassium: 3.8 mEq/L (ref 3.5–5.1)
Sodium: 138 mEq/L (ref 135–145)

## 2021-09-08 LAB — LIPID PANEL
Cholesterol: 140 mg/dL (ref 0–200)
HDL: 36.6 mg/dL — ABNORMAL LOW (ref >39.00)
LDL Cholesterol: 71 mg/dL (ref 0–99)
NonHDL: 103.49
Total CHOL/HDL Ratio: 4
Triglycerides: 160 mg/dL — ABNORMAL HIGH (ref 0.0–149.0)
VLDL: 32 mg/dL (ref 0.0–40.0)

## 2021-09-08 LAB — URINALYSIS, ROUTINE W REFLEX MICROSCOPIC
Bilirubin Urine: NEGATIVE
Hgb urine dipstick: NEGATIVE
Ketones, ur: NEGATIVE
Leukocytes,Ua: NEGATIVE
Nitrite: NEGATIVE
RBC / HPF: NONE SEEN (ref 0–?)
Specific Gravity, Urine: 1.015 (ref 1.000–1.030)
Total Protein, Urine: NEGATIVE
Urine Glucose: NEGATIVE
Urobilinogen, UA: 0.2 (ref 0.0–1.0)
pH: 6.5 (ref 5.0–8.0)

## 2021-09-08 LAB — HEMOGLOBIN A1C: Hgb A1c MFr Bld: 6.3 % (ref 4.6–6.5)

## 2021-09-08 LAB — VITAMIN D 25 HYDROXY (VIT D DEFICIENCY, FRACTURES): VITD: 23.7 ng/mL — ABNORMAL LOW (ref 30.00–100.00)

## 2021-09-08 LAB — VITAMIN B12: Vitamin B-12: 253 pg/mL (ref 211–911)

## 2021-09-08 LAB — TSH: TSH: 0.68 u[IU]/mL (ref 0.35–5.50)

## 2021-09-08 LAB — PSA: PSA: 1.12 ng/mL (ref 0.10–4.00)

## 2021-09-08 NOTE — Progress Notes (Signed)
Patient ID: Ethan Ellis, male   DOB: 09/02/1960, 61 y.o.   MRN: [id number]         Chief Complaint:: wellness exam and Follow-up  Left shoulder pain, smoker, low vit d, htn       HPI:  Ethan Ellis is a 61 y.o. male here for wellness exam; due for LDCT but pt missed appt and needs to reschedule; due for colonoscopy, pt stil smoking, not ready to quit.  O/w up to date                        Also was lifting some wts recently, felt something tear in the left shoulder and now cant bend the elbow or raise the arm as well.  Pt denies chest pain, increased sob or doe, wheezing, orthopnea, PND, increased LE swelling, palpitations, dizziness or syncope.   Pt denies polydipsia, polyuria, or new focal neuro s/s.    Pt denies fever, wt loss, night sweats, loss of appetite, or other constitutional symptoms  Has had previous left shoulder surgury per Dr Marlou Sa.  Also has worsening left knee pain, swelling with ambulation for several months   Wt Readings from Last 3 Encounters:  09/08/21 175 lb (79.4 kg)  02/27/21 184 lb (83.5 kg)  12/25/20 182 lb (82.6 kg)   BP Readings from Last 3 Encounters:  09/08/21 128/68  02/27/21 132/82  12/26/20 130/64   Immunization History  Administered Date(s) Administered   Influenza,inj,Quad PF,6+ Mos 02/22/2014, 04/01/2015, 11/23/2016, 11/24/2017, 12/06/2018   Influenza-Unspecified 04/15/2016   Pneumococcal Conjugate-13 04/22/2016   Pneumococcal Polysaccharide-23 05/04/2016   Tdap 09/14/2016   Zoster Recombinat (Shingrix) 06/18/2017, 08/19/2017   Health Maintenance Due  Topic Date Due   COLONOSCOPY (Pts 45-99yr Insurance coverage will need to be confirmed)  12/05/2017      Past Medical History:  Diagnosis Date   Depression    Diabetes mellitus without complication ([place]    type 2   History of kidney stones    passed stone - no surgery   Hyperlipidemia 03/10/2014   Hypertension    LBP (low back pain) 2009   Right   Unspecified viral hepatitis C  without hepatic coma 04/04/2015   Past Surgical History:  Procedure Laterality Date   APPENDECTOMY     BACK SURGERY     lower back    INGUINAL HERNIA REPAIR Left 04/26/2019   Procedure: LEFT INGUINAL HERNIA REPAIR WITH MESH;  Surgeon: TDonnie Mesa MD;  [facility]  Service: General;  Laterality: Left;  LMA AND TAP BLOCK   LESION REMOVAL Left 04/26/2019   Procedure: EXCISION OF SKIN LESION LEFT GROIN AND BASE OF PENIS;  Surgeon: TDonnie Mesa MD;  [facility]  Service: General;  Laterality: Left;   REVERSE SHOULDER ARTHROPLASTY Left 12/25/2020   Procedure: LEFT REVERSE SHOULDER REPLACEMENT;  Surgeon: DMeredith Pel MD;  [facility]  Service: Orthopedics;  Laterality: Left;   SHOULDER SURGERY     right - x 3 surgery    reports that he has been smoking cigarettes. He has a 46.00 pack-year smoking history. He has never used smokeless tobacco. He reports that he does not currently use drugs. He reports that he does not drink alcohol. family history includes Diabetes in his father. Allergies  Allergen Reactions   Naproxen Swelling and Other (See Comments)    Mouth swelling- patient can take advil and some other NSAIDs w/o problems, however  Current Outpatient Medications on File Prior to Visit  Medication Sig Dispense Refill   acetaminophen (TYLENOL) 650 MG CR tablet Take 1,950 mg by mouth 2 (two) times daily.     albuterol (VENTOLIN HFA) 108 (90 Base) MCG/ACT inhaler Inhale 2 puffs into the lungs every 6 (six) hours as needed for wheezing or shortness of breath. 54 g 3   aspirin EC 81 MG tablet Take 1 tablet (81 mg total) by mouth daily. 90 tablet 11   atorvastatin (LIPITOR) 10 MG tablet Take 1 tablet (10 mg total) by mouth daily. 90 tablet 3   Blood Glucose Calibration (ACCU-CHEK GUIDE CONTROL) LIQD Use as directed once daily e11.9 1 each 5   Blood Glucose Monitoring Suppl (ACCU-CHEK GUIDE ME) w/Device KIT Use as directed once  daily E11.9 1 kit 0   canagliflozin (INVOKANA) 300 MG TABS tablet Take 1 tablet (300 mg total) by mouth daily before breakfast. 90 tablet 3   Cholecalciferol (VITAMIN D-3 PO) Take 1 capsule by mouth daily.     citalopram (CELEXA) 20 MG tablet Take 1 tablet (20 mg total) by mouth daily. Must keep June appt for future refills 30 tablet 0   clotrimazole-betamethasone (LOTRISONE) cream Use as directed to affected area twice daily as needed 15 g 1   glipiZIDE (GLUCOTROL XL) 10 MG 24 hr tablet Take 1 tablet by mouth once daily with breakfast 90 tablet 3   glucose blood (ACCU-CHEK GUIDE) test strip Use as instructed once daily E11.9 100 each 12   hydrochlorothiazide (HYDRODIURIL) 25 MG tablet Take 1 tablet (25 mg total) by mouth daily. 90 tablet 3   Lancets MISC Use as directed once daily to check blood sugar.  Diagnosis code E11.9 100 each 11   losartan (COZAAR) 100 MG tablet Take 1 tablet (100 mg total) by mouth daily. 90 tablet 3   metFORMIN (GLUCOPHAGE) 500 MG tablet 2 tab by mouth in the AM, and 1 in the PM 270 tablet 3   methocarbamol (ROBAXIN) 500 MG tablet Take 1 tablet (500 mg total) by mouth every 8 (eight) hours as needed for muscle spasms. 30 tablet 0   methocarbamol (ROBAXIN) 500 MG tablet Take 1 tablet (500 mg total) by mouth every 12 (twelve) hours as needed for muscle spasms. 30 tablet 0   nortriptyline (PAMELOR) 50 MG capsule Take 1 capsule (50 mg total) by mouth 3 (three) times daily. 270 capsule 1   tamsulosin (FLOMAX) 0.4 MG CAPS capsule Take 1 capsule (0.4 mg total) by mouth daily. Must keep June appt for future refills 30 capsule 0   tiZANidine (ZANAFLEX) 4 MG tablet Take 12 mg by mouth at bedtime. 90 tablet 0   No current facility-administered medications on file prior to visit.        ROS:  All others reviewed and negative.  Objective        PE:  BP 128/68 (BP Location: Left Arm, Patient Position: Sitting, Cuff Size: Large)   Pulse 83   Temp 98.6 F (37 C) (Oral)   Ht 5'  9.5" (1.765 m)   Wt 175 lb (79.4 kg)   SpO2 95%   BMI 25.47 kg/m                 Constitutional: Pt appears in NAD               HENT: Head: NCAT.                Right Ear: External ear  normal.                 Left Ear: External ear normal.                Eyes: . Pupils are equal, round, and reactive to light. Conjunctivae and EOM are normal               Nose: without d/c or deformity               Neck: Neck supple. Gross normal ROM               Cardiovascular: Normal rate and regular rhythm.                 Pulmonary/Chest: Effort normal and breath sounds without rales or wheezing.                Abd:  Soft, NT, ND, + BS, no organomegaly               Neurological: Pt is alert. At baseline orientation, motor grossly intact               Skin: Skin is warm. No rashes, no other new lesions, LE edema - none               Left shoulder with bunching of the left bicep and loss of attachement at the left bicipital tendon insertion site               Psychiatric: Pt behavior is normal without agitation   Micro: none  Cardiac tracings I have personally interpreted today:  none  Pertinent Radiological findings (summarize): none   Lab Results  Component Value Date   WBC 10.8 (H) 09/08/2021   HGB 15.6 09/08/2021   HCT 45.6 09/08/2021   PLT 230.0 09/08/2021   GLUCOSE 69 (L) 09/08/2021   CHOL 140 09/08/2021   TRIG 160.0 (H) 09/08/2021   HDL 36.60 (L) 09/08/2021   LDLDIRECT 91.0 07/14/2020   LDLCALC 71 09/08/2021   ALT 15 09/08/2021   AST 9 09/08/2021   NA 138 09/08/2021   K 3.8 09/08/2021   CL 100 09/08/2021   CREATININE 0.97 09/08/2021   BUN 16 09/08/2021   CO2 27 09/08/2021   TSH 0.68 09/08/2021   PSA 1.12 09/08/2021   HGBA1C 6.3 09/08/2021   MICROALBUR <0.7 09/08/2021   Assessment/Plan:  Ethan Ellis is a 61 y.o. White or Caucasian [1] male with  has a past medical history of Depression, Diabetes mellitus without complication ([place]), History of kidney stones,  Hyperlipidemia (03/10/2014), Hypertension, LBP (low back pain) (2009), and Unspecified viral hepatitis C without hepatic coma (04/04/2015).  Vitamin D deficiency Last vitamin D Lab Results  Component Value Date   VD25OH 73 (L) 02/27/2021   Low, to start oral replacement   Encounter for well adult exam with abnormal findings Age and sex appropriate education and counseling updated with regular exercise and diet Referrals for preventative services - for colonoscopy Immunizations addressed - none needed Smoking counseling  - counsled to quit smoking, pt not reaady Evidence for depression or other mood disorder - none significant Most recent labs reviewed. I have personally reviewed and have noted: 1) the patient's medical and social history 2) The patient's current medications and supplements 3) The patient's height, weight, and BMI have been recorded in the chart   Uncontrolled hypertension BP Readings from Last 3 Encounters:  09/08/21 128/68  02/27/21 132/82  12/26/20 130/64  Stable, pt to continue medical treatment hct 25 mg qd, losartan 100 mg qd   Tobacco dependence Pt counsled to quit, pt not ready  Diabetes ([place]) Lab Results  Component Value Date   HGBA1C 6.3 09/08/2021   Stable, pt to continue current medical treatment invokana 300 mg qd, glucotrol xll 10 mg qd, and metformin 500 - 2 tab in am, 1 in pm   Hyperlipidemia Lab Results  Component Value Date   LDLCALC 71 09/08/2021   Uncontrolled, goal ldl < 70, pt declines change in med tx, for pt to continue current statin lipitor 10 mg qd  Rupture of left long head biceps tendon Recent accidental - for referral Dr Dean/ ortho  History of colon polyps For f/u colnoscopy  Arthritis of left knee With chronic pain - also for ortho referral  Followup: No follow-ups on file.  Cathlean Cower, MD 09/11/2021 8:39 PM [facility]

## 2021-09-11 ENCOUNTER — Encounter: Payer: Self-pay | Admitting: Internal Medicine

## 2021-09-11 NOTE — Assessment & Plan Note (Signed)
For f/u colnoscopy

## 2021-09-11 NOTE — Assessment & Plan Note (Signed)
With chronic pain - also for ortho referral

## 2021-09-11 NOTE — Assessment & Plan Note (Signed)
Recent accidental - for referral Dr Dean/ ortho

## 2021-09-11 NOTE — Assessment & Plan Note (Signed)
BP Readings from Last 3 Encounters:  09/08/21 128/68  02/27/21 132/82  12/26/20 130/64   Stable, pt to continue medical treatment hct 25 mg qd, losartan 100 mg qd

## 2021-09-11 NOTE — Assessment & Plan Note (Signed)
Age and sex appropriate education and counseling updated with regular exercise and diet Referrals for preventative services - for colonoscopy Immunizations addressed - none needed Smoking counseling  - counsled to quit smoking, pt not reaady Evidence for depression or other mood disorder - none significant Most recent labs reviewed. I have personally reviewed and have noted: 1) the patient's medical and social history 2) The patient's current medications and supplements 3) The patient's height, weight, and BMI have been recorded in the chart

## 2021-09-11 NOTE — Assessment & Plan Note (Signed)
Pt counsled to quit, pt not ready °

## 2021-09-11 NOTE — Assessment & Plan Note (Signed)
Lab Results  Component Value Date   LDLCALC 71 09/08/2021   Uncontrolled, goal ldl < 70, pt declines change in med tx, for pt to continue current statin lipitor 10 mg qd

## 2021-09-11 NOTE — Assessment & Plan Note (Signed)
Lab Results  Component Value Date   HGBA1C 6.3 09/08/2021   Stable, pt to continue current medical treatment invokana 300 mg qd, glucotrol xll 10 mg qd, and metformin 500 - 2 tab in am, 1 in pm

## 2021-09-16 ENCOUNTER — Other Ambulatory Visit: Payer: Self-pay | Admitting: *Deleted

## 2021-09-16 DIAGNOSIS — Z87891 Personal history of nicotine dependence: Secondary | ICD-10-CM

## 2021-09-16 DIAGNOSIS — F17210 Nicotine dependence, cigarettes, uncomplicated: Secondary | ICD-10-CM

## 2021-09-16 DIAGNOSIS — Z122 Encounter for screening for malignant neoplasm of respiratory organs: Secondary | ICD-10-CM

## 2021-09-23 ENCOUNTER — Ambulatory Visit: Payer: Self-pay

## 2021-09-23 ENCOUNTER — Encounter: Payer: Self-pay | Admitting: Orthopedic Surgery

## 2021-09-23 ENCOUNTER — Ambulatory Visit: Payer: Medicare HMO | Admitting: Orthopedic Surgery

## 2021-09-23 ENCOUNTER — Ambulatory Visit (INDEPENDENT_AMBULATORY_CARE_PROVIDER_SITE_OTHER): Payer: Medicare HMO

## 2021-09-23 DIAGNOSIS — M25562 Pain in left knee: Secondary | ICD-10-CM

## 2021-09-23 DIAGNOSIS — Z96612 Presence of left artificial shoulder joint: Secondary | ICD-10-CM | POA: Diagnosis not present

## 2021-09-23 DIAGNOSIS — M5412 Radiculopathy, cervical region: Secondary | ICD-10-CM

## 2021-09-23 DIAGNOSIS — M25561 Pain in right knee: Secondary | ICD-10-CM

## 2021-09-23 DIAGNOSIS — M12812 Other specific arthropathies, not elsewhere classified, left shoulder: Secondary | ICD-10-CM | POA: Diagnosis not present
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
## 2021-09-28 ENCOUNTER — Ambulatory Visit
Admission: RE | Admit: 2021-09-28 | Discharge: 2021-09-28 | Disposition: A | Discharge status: Home / Self Care | Payer: Medicare HMO | Source: Ambulatory Visit | Attending: Acute Care | Admitting: Acute Care

## 2021-09-28 DIAGNOSIS — Z122 Encounter for screening for malignant neoplasm of respiratory organs: Secondary | ICD-10-CM

## 2021-09-28 DIAGNOSIS — Z87891 Personal history of nicotine dependence: Secondary | ICD-10-CM

## 2021-09-28 DIAGNOSIS — F17210 Nicotine dependence, cigarettes, uncomplicated: Secondary | ICD-10-CM

## 2021-09-28 DIAGNOSIS — K8020 Calculus of gallbladder without cholecystitis without obstruction: Secondary | ICD-10-CM | POA: Diagnosis not present

## 2021-09-28 DIAGNOSIS — I2510 Atherosclerotic heart disease of native coronary artery without angina pectoris: Secondary | ICD-10-CM | POA: Diagnosis not present

## 2021-09-28 DIAGNOSIS — J432 Centrilobular emphysema: Secondary | ICD-10-CM | POA: Diagnosis not present

## 2021-09-30 ENCOUNTER — Other Ambulatory Visit: Payer: Self-pay | Admitting: Acute Care

## 2021-09-30 DIAGNOSIS — F17210 Nicotine dependence, cigarettes, uncomplicated: Secondary | ICD-10-CM

## 2021-09-30 DIAGNOSIS — Z122 Encounter for screening for malignant neoplasm of respiratory organs: Secondary | ICD-10-CM

## 2021-09-30 DIAGNOSIS — Z87891 Personal history of nicotine dependence: Secondary | ICD-10-CM

## 2021-10-05 ENCOUNTER — Ambulatory Visit
Admission: RE | Admit: 2021-10-05 | Discharge: 2021-10-05 | Disposition: A | Discharge status: Home / Self Care | Payer: Medicare HMO | Source: Ambulatory Visit | Attending: Orthopedic Surgery | Admitting: Orthopedic Surgery

## 2021-10-05 DIAGNOSIS — M4802 Spinal stenosis, cervical region: Secondary | ICD-10-CM | POA: Diagnosis not present

## 2021-10-05 DIAGNOSIS — M5412 Radiculopathy, cervical region: Secondary | ICD-10-CM | POA: Diagnosis not present

## 2021-10-05 DIAGNOSIS — M4312 Spondylolisthesis, cervical region: Secondary | ICD-10-CM | POA: Diagnosis not present

## 2021-10-16 ENCOUNTER — Ambulatory Visit
Admission: RE | Admit: 2021-10-16 | Discharge: 2021-10-16 | Disposition: A | Discharge status: Home / Self Care | Payer: Medicare HMO | Source: Ambulatory Visit | Attending: Orthopedic Surgery | Admitting: Orthopedic Surgery

## 2021-10-16 DIAGNOSIS — Z96612 Presence of left artificial shoulder joint: Secondary | ICD-10-CM

## 2021-10-16 DIAGNOSIS — M25512 Pain in left shoulder: Secondary | ICD-10-CM | POA: Diagnosis not present

## 2021-10-19 DIAGNOSIS — R3912 Poor urinary stream: Secondary | ICD-10-CM | POA: Diagnosis not present

## 2021-10-19 DIAGNOSIS — N401 Enlarged prostate with lower urinary tract symptoms: Secondary | ICD-10-CM | POA: Diagnosis not present

## 2021-10-19 DIAGNOSIS — N5201 Erectile dysfunction due to arterial insufficiency: Secondary | ICD-10-CM | POA: Diagnosis not present

## 2021-10-21 ENCOUNTER — Ambulatory Visit: Payer: Medicare HMO | Admitting: Orthopedic Surgery

## 2021-10-21 DIAGNOSIS — M25562 Pain in left knee: Secondary | ICD-10-CM

## 2021-10-21 DIAGNOSIS — M25561 Pain in right knee: Secondary | ICD-10-CM

## 2021-10-21 DIAGNOSIS — M12812 Other specific arthropathies, not elsewhere classified, left shoulder: Secondary | ICD-10-CM | POA: Diagnosis not present

## 2021-10-21 MED ORDER — AMOXICILLIN 500 MG PO TABS: ORAL_TABLET | ORAL | 0 refills | Status: DC

## 2021-10-23 ENCOUNTER — Encounter: Payer: Self-pay | Admitting: Orthopedic Surgery

## 2021-10-23 NOTE — Progress Notes (Signed)
Office Visit Note   Patient: Ethan Ellis           Date of Birth: 1960-09-15           MRN: [id number] Visit Date: 10/21/2021 Requested by: Biagio Borg, MD [address],  [place] [address] PCP: Biagio Borg, MD  Subjective: Chief Complaint  Patient presents with   Other     Scan review    HPI: Ethan Ellis is a 61 year old patient here to review MRI scans.  CT scan of the left shoulder demonstrates postsurgical changes of reverse left shoulder arthroplasty with a small collection in the subacromial space measuring 3 and half centimeters.  Components intact with no evidence of loosening.  MRI of the cervical spine also reviewed which shows severe left-sided foraminal stenosis at C3-4 through C6-7.  Patient states it hurts for him to overdo things.  He did have a tire blow out which initiated these symptoms in his left arm and shoulder.  The pain wakes him from sleep at night with both arms going numb.  He did have a tire blow out in April of this year and he could not change the tire after the jarring of his neck and shoulder.              ROS: All systems reviewed are negative as they relate to the chief complaint within the history of present illness.  Patient denies  fevers or chills.   Assessment & Plan: Visit Diagnoses:  1. Pain in both knees, unspecified chronicity   2. Rotator cuff arthropathy of left shoulder     Plan: Impression is no evidence of infection or component failure or loosening in the reverse shoulder replacement.  He does have very good reasons for his arm and shoulder to be painful based on the amount of foraminal stenosis present.  I think that injection in the neck with Dr. Ernestina Patches could be helpful.  He will contact us via MyChart message if he wants to set that up.  Has dental appointment 295 and preop antibiotics were called in.  Follow-up with [place] as needed.  Follow-Up Instructions: No follow-ups on file.   Orders:  No orders of the defined types  were placed in this encounter.  Meds ordered this encounter  Medications   amoxicillin (AMOXIL) 500 MG tablet    Sig: 2g 1 hour prior to dental procedure    Dispense:  10 tablet    Refill:  0      Procedures: No procedures performed   Clinical Data: No additional findings.  Objective: Vital Signs: There were no vitals taken for this visit.  Physical Exam:   Constitutional: Patient appears well-developed HEENT:  Head: Normocephalic Eyes:EOM are normal Neck: Normal range of motion Cardiovascular: Normal rate Pulmonary/chest: Effort normal Neurologic: Patient is alert Skin: Skin is warm Psychiatric: Patient has normal mood and affect   Ortho Exam: Ortho exam demonstrates pretty reasonable cervical spine range of motion.  No muscle atrophy right arm versus left.  He has forward flexion and abduction both above 90 degrees on the left-hand side.  No coarse grinding or crepitus on the right or left-hand side with internal and external rotation at 90 degrees of abduction.  Patient has 5 out of 5 grip EPL FPL interosseous wrist flexion extension biceps triceps and deltoid strength.  Palpable pedal pulses and radial pulses.  Neck range of motion slightly painful with rotation to the left.  Flexion extension close to normal.  Specialty Comments:  No specialty comments available.  Imaging: No results found.   PMFS History: Patient Active Problem List   Diagnosis Date Noted   History of colon polyps 09/08/2021   Arthritis of left knee 09/08/2021   Rupture of left long head biceps tendon 09/08/2021   Rotator cuff arthropathy, left    S/P reverse total shoulder arthroplasty, left 12/25/2020   Vitamin D deficiency 07/16/2020   Syncope 07/14/2020   Encounter for well adult exam with abnormal findings 01/08/2020   COVID-19 virus infection 01/07/2020   Encounter for screening for lung cancer 06/11/2019   Chronic low back pain 06/11/2019   Status post left rotator cuff repair  03/12/2019   Cholelithiasis 12/06/2018   Left inguinal hernia 12/06/2018   Weight loss 12/06/2018   Adenomatous polyp 12/06/2018   Depression 12/06/2018   Urinary urgency 12/06/2018   Left upper arm pain 11/24/2017   Cervical radiculopathy 06/09/2017   Left lateral epicondylitis 05/23/2017   Lesion of penis 09/14/2016   Rash 09/14/2016   Asthma with acute exacerbation 01/27/2016   Allergic rhinitis 01/27/2016   Dyspnea 01/02/2016   Unspecified viral hepatitis C without hepatic coma 04/04/2015   Hyperlipidemia 03/10/2014   Uncontrolled hypertension 02/22/2014   Diabetes ([place]) 02/22/2014   Tobacco dependence 12/30/2010   Fatigue 12/30/2010   Encounter for long-term (current) use of other medications 12/25/2010   Paresthesia 12/08/2010   FREQUENCY, URINARY 01/29/2009   Neoplasm of uncertain behavior of skin 04/24/2008   Cough 12/04/2007   LOW BACK PAIN 06/28/2007   PAIN IN SOFT TISSUES OF LIMB 06/19/2007   Past Medical History:  Diagnosis Date   Depression    Diabetes mellitus without complication ([place])    type 2   History of kidney stones    passed stone - no surgery   Hyperlipidemia 03/10/2014   Hypertension    LBP (low back pain) 2009   Right   Unspecified viral hepatitis C without hepatic coma 04/04/2015    Family History  Problem Relation Age of Onset   Diabetes Father    Colon cancer Neg Hx    Colon polyps Neg Hx    Rectal cancer Neg Hx    Stomach cancer Neg Hx     Past Surgical History:  Procedure Laterality Date   APPENDECTOMY     BACK SURGERY     lower back    INGUINAL HERNIA REPAIR Left 04/26/2019   Procedure: LEFT INGUINAL HERNIA REPAIR WITH MESH;  Surgeon: Donnie Mesa, MD;  Location: [facility];  Service: General;  Laterality: Left;  LMA AND TAP BLOCK   LESION REMOVAL Left 04/26/2019   Procedure: EXCISION OF SKIN LESION LEFT GROIN AND BASE OF PENIS;  Surgeon: Donnie Mesa, MD;  Location: [facility];  Service: General;   Laterality: Left;   REVERSE SHOULDER ARTHROPLASTY Left 12/25/2020   Procedure: LEFT REVERSE SHOULDER REPLACEMENT;  Surgeon: Meredith Pel, MD;  Location: [facility];  Service: Orthopedics;  Laterality: Left;   SHOULDER SURGERY     right - x 3 surgery   Social History   Occupational History   Not on file  Tobacco Use   Smoking status: Every Day    Packs/day: 1.00    Years: 46.00    Total pack years: 46.00    Types: Cigarettes   Smokeless tobacco: Never  Vaping Use   Vaping Use: Never used  Substance and Sexual Activity   Alcohol use: No    Alcohol/week: 0.0 standard  drinks of alcohol   Drug use: Not Currently   Sexual activity: Yes

## 2021-11-02 ENCOUNTER — Telehealth: Payer: Self-pay | Admitting: Orthopedic Surgery

## 2021-11-02 NOTE — Telephone Encounter (Signed)
Ok for this? 

## 2021-11-02 NOTE — Telephone Encounter (Signed)
Ok sure

## 2021-11-02 NOTE — Telephone Encounter (Signed)
Pt called and states he needs a note stating that he has a pinched nerve in his neck for his insurance.   CB [phone]

## 2021-11-03 NOTE — Telephone Encounter (Signed)
Tried calling to advise note completed and could be picked up at the front desk.  No answer, no VM to LM

## 2021-11-04 ENCOUNTER — Encounter: Payer: Self-pay | Admitting: Gastroenterology

## 2021-11-18 ENCOUNTER — Ambulatory Visit (AMBULATORY_SURGERY_CENTER): Payer: Self-pay | Admitting: *Deleted

## 2021-11-18 VITALS — Ht 69.5 in | Wt 184.0 lb

## 2021-11-18 DIAGNOSIS — Z86010 Personal history of colonic polyps: Secondary | ICD-10-CM

## 2021-11-18 NOTE — Progress Notes (Signed)
Patient is here in-person for PV. Patient denies any allergies to eggs or soy. Patient denies any problems with anesthesia/sedation. Patient is not on any oxygen at home. Patient is not taking any diet/weight loss medications or blood thinners. Went over procedure prep instructions with the patient. Patient is aware of our care-partner policy.

## 2021-11-26 ENCOUNTER — Telehealth: Payer: Self-pay | Admitting: Orthopedic Surgery

## 2021-11-26 ENCOUNTER — Other Ambulatory Visit: Payer: Self-pay

## 2021-11-26 DIAGNOSIS — M5412 Radiculopathy, cervical region: Secondary | ICD-10-CM

## 2021-11-26 NOTE — Telephone Encounter (Signed)
Pt called and states he is still having neck pain. Wondering if he could go ahead and get the injection referral?   Cb [phone]

## 2021-11-26 NOTE — Telephone Encounter (Signed)
OV from August Dr. Marlou Sa suggested ESI with FN for neck and tha tthe pt would call when he was ready to sch. Referral entered into chart and pt called to advise that this has been done. Advised that once the precert has been obtained from insurance company someone would call with any appt date and time and that this can take up to 7 business days to hear back from [employer]. Voiced understanding and will call with questions.

## 2021-12-01 ENCOUNTER — Encounter: Payer: Self-pay | Admitting: Gastroenterology

## 2021-12-02 ENCOUNTER — Telehealth: Payer: Self-pay | Admitting: Physical Medicine and Rehabilitation

## 2021-12-02 NOTE — Telephone Encounter (Signed)
Appt cancelled. Said he would call us when he is ready to reschedule.

## 2021-12-02 NOTE — Telephone Encounter (Signed)
Patient wants to cx appt on 12/10/21 '@930'$  He can not afford copay right now. The best number to reach him at  [phone]

## 2021-12-02 NOTE — Telephone Encounter (Signed)
I called him.  He cannot come up with $245 for the shot.  Anyway you can call someone from time to put him on a payment plan or something like that so he can try the injection.  Thanks

## 2021-12-02 NOTE — Telephone Encounter (Signed)
Patient has a question about the injections he is getting.  Best number to reached  [phone]

## 2021-12-10 ENCOUNTER — Encounter: Payer: Medicare HMO | Admitting: Physical Medicine and Rehabilitation

## 2021-12-10 ENCOUNTER — Ambulatory Visit: Payer: Self-pay

## 2021-12-10 ENCOUNTER — Ambulatory Visit (INDEPENDENT_AMBULATORY_CARE_PROVIDER_SITE_OTHER): Payer: Medicare HMO | Admitting: Physical Medicine and Rehabilitation

## 2021-12-10 VITALS — BP 117/75 | HR 83

## 2021-12-10 DIAGNOSIS — M5412 Radiculopathy, cervical region: Secondary | ICD-10-CM | POA: Diagnosis not present

## 2021-12-10 MED ORDER — BETAMETHASONE SOD PHOS & ACET 6 (3-3) MG/ML IJ SUSP
12.0000 mg | Freq: Once | INTRAMUSCULAR | Status: AC
  Administered 2021-12-10: 12 mg

## 2021-12-10 NOTE — Progress Notes (Signed)
Numeric Pain Rating Scale and Functional Assessment Average Pain 8-10 depending on position of head   In the last MONTH (on 0-10 scale) has pain interfered with the following?  1. General activity like being  able to carry out your everyday physical activities such as walking, climbing stairs, carrying groceries, or moving a chair?  Rating(10+)   +Driver, -BT, -Dye Allergies.

## 2021-12-10 NOTE — Patient Instructions (Signed)

## 2021-12-11 NOTE — Progress Notes (Signed)
JAXTYN LINVILLE - 61 y.o. male MRN [id number]  Date of birth: 1960-09-20  Office Visit Note: Visit Date: 12/10/2021 PCP: Biagio Borg, MD Referred by: Biagio Borg, MD  Subjective: Chief Complaint  Patient presents with   Neck - Pain   HPI:  KALI AMBLER is a 61 y.o. male who comes in today at the request of Dr. Anderson Malta for planned Right C7-T1 Cervical Interlaminar epidural steroid injection with fluoroscopic guidance.  The patient has failed conservative care including home exercise, medications, time and activity modification.  This injection will be diagnostic and hopefully therapeutic.  Please see requesting physician notes for further details and justification.   ROS Otherwise per HPI.  Assessment & Plan: Visit Diagnoses:    ICD-10-CM   1. Cervical radiculopathy  M54.12 XR C-ARM NO REPORT    Epidural Steroid injection    betamethasone acetate-betamethasone sodium phosphate (CELESTONE) injection 12 mg      Plan: No additional findings.   Meds & Orders:  Meds ordered this encounter  Medications   betamethasone acetate-betamethasone sodium phosphate (CELESTONE) injection 12 mg    Orders Placed This Encounter  Procedures   XR C-ARM NO REPORT   Epidural Steroid injection    Follow-up: Return for visit to requesting provider as needed.   Procedures: No procedures performed  Cervical Epidural Steroid Injection - Interlaminar Approach with Fluoroscopic Guidance  Patient: DAVIAN HANSHAW      Date of Birth: 06-06-1960 MRN: [id number] PCP: Biagio Borg, MD      Visit Date: 12/10/2021   Universal Protocol:    Date/Time: 09/29/236:08 PM  Consent Given By: the patient  Position: PRONE  Additional Comments: Vital signs were monitored before and after the procedure. Patient was prepped and draped in the usual sterile fashion. The correct patient, procedure, and site was verified.   Injection Procedure Details:   Procedure diagnoses: Cervical  radiculopathy [M54.12]    Meds Administered:  Meds ordered this encounter  Medications   betamethasone acetate-betamethasone sodium phosphate (CELESTONE) injection 12 mg     Laterality: Right  Location/Site: C7-T1  Needle: 3.5 in., 20 ga. Tuohy  Needle Placement: Paramedian epidural space  Findings:  -Comments: Excellent flow of contrast into the epidural space.  Procedure Details: Using a paramedian approach from the side mentioned above, the region overlying the inferior lamina was localized under fluoroscopic visualization and the soft tissues overlying this structure were infiltrated with 4 ml. of 1% Lidocaine without Epinephrine. A # 20 gauge, Tuohy needle was inserted into the epidural space using a paramedian approach.  The epidural space was localized using loss of resistance along with contralateral oblique bi-planar fluoroscopic views.  After negative aspirate for air, blood, and CSF, a 2 ml. volume of Isovue-250 was injected into the epidural space and the flow of contrast was observed. Radiographs were obtained for documentation purposes.   The injectate was administered into the level noted above.  Additional Comments:  The patient tolerated the procedure well Dressing: 2 x 2 sterile gauze and Band-Aid    Post-procedure details: Patient was observed during the procedure. Post-procedure instructions were reviewed.  Patient left the clinic in stable condition.   Clinical History: MRI CERVICAL SPINE WITHOUT CONTRAST   TECHNIQUE: Multiplanar, multisequence MR imaging of the cervical spine was performed. No intravenous contrast was administered.   COMPARISON:  Cervical spine radiographs 09/23/2021 head MRI 09/02/2020.   FINDINGS: Alignment: Straightening of the normal cervical lordosis. Trace anterolisthesis of C4 on  C5 and trace retrolisthesis of C5 on C6.   Vertebrae: No fracture or suspicious marrow lesion. Degenerative endplate changes at L8-7 and C6-7  including very mild edema and associated moderate disc space narrowing.   Cord: Normal signal and morphology.   Posterior Fossa, vertebral arteries, paraspinal tissues: T2 hyperintensities in the pons compatible with chronic small vessel ischemia as shown on the prior head MRI, including a chronic lacunar infarct on the right. Preserved vertebral artery flow voids.   Disc levels:   C2-3: Mild facet arthrosis without disc herniation or stenosis.   C3-4: Disc bulging, uncovertebral spurring, and mild right and severe left facet arthrosis result in mild spinal stenosis and borderline to mild right and severe left neural foraminal stenosis.   C4-5: Disc bulging, left uncovertebral spurring, and mild right and severe left facet arthrosis result in borderline spinal stenosis and severe left neural foraminal stenosis.   C5-6: Disc bulging and uncovertebral spurring result in mild spinal stenosis and moderate to severe right and severe left neural foraminal stenosis.   C6-7: Disc bulging, uncovertebral spurring, and mild facet arthrosis result in mild spinal stenosis and moderate to severe left greater than right neural foraminal stenosis.   C7-T1: Moderate left facet arthrosis without disc herniation or stenosis.   IMPRESSION: Multilevel cervical disc and facet degeneration with mild spinal stenosis and moderate to severe neural foraminal stenosis from C3-4 through C6-7.     Electronically Signed   By: Logan Bores M.D.   On: 10/06/2021 10:13     Objective:  VS:  HT:    WT:   BMI:     BP:117/75  HR:83bpm  TEMP: ( )  RESP:94 % Physical Exam Vitals and nursing note reviewed.  Constitutional:      General: He is not in acute distress.    Appearance: Normal appearance. He is not ill-appearing.  HENT:     Head: Normocephalic and atraumatic.     Right Ear: External ear normal.     Left Ear: External ear normal.  Eyes:     Extraocular Movements: Extraocular movements  intact.  Cardiovascular:     Rate and Rhythm: Normal rate.     Pulses: Normal pulses.  Abdominal:     General: There is no distension.     Palpations: Abdomen is soft.  Musculoskeletal:        General: No signs of injury.     Cervical back: Neck supple. Tenderness present. No rigidity.     Right lower leg: No edema.     Left lower leg: No edema.     Comments: Patient has good strength in the upper extremities with 5 out of 5 strength in wrist extension long finger flexion APB.  No intrinsic hand muscle atrophy.  Negative Hoffmann's test.  Lymphadenopathy:     Cervical: No cervical adenopathy.  Skin:    Findings: No erythema or rash.  Neurological:     General: No focal deficit present.     Mental Status: He is alert and oriented to person, place, and time.     Sensory: No sensory deficit.     Motor: No weakness or abnormal muscle tone.     Coordination: Coordination normal.  Psychiatric:        Mood and Affect: Mood normal.        Behavior: Behavior normal.      Imaging: No results found.

## 2021-12-11 NOTE — Procedures (Signed)
Cervical Epidural Steroid Injection - Interlaminar Approach with Fluoroscopic Guidance  Patient: Ethan Ellis      Date of Birth: 06-01-1960 MRN: [id number] PCP: Biagio Borg, MD      Visit Date: 12/10/2021   Universal Protocol:    Date/Time: 09/29/236:08 PM  Consent Given By: the patient  Position: PRONE  Additional Comments: Vital signs were monitored before and after the procedure. Patient was prepped and draped in the usual sterile fashion. The correct patient, procedure, and site was verified.   Injection Procedure Details:   Procedure diagnoses: Cervical radiculopathy [M54.12]    Meds Administered:  Meds ordered this encounter  Medications   betamethasone acetate-betamethasone sodium phosphate (CELESTONE) injection 12 mg     Laterality: Right  Location/Site: C7-T1  Needle: 3.5 in., 20 ga. Tuohy  Needle Placement: Paramedian epidural space  Findings:  -Comments: Excellent flow of contrast into the epidural space.  Procedure Details: Using a paramedian approach from the side mentioned above, the region overlying the inferior lamina was localized under fluoroscopic visualization and the soft tissues overlying this structure were infiltrated with 4 ml. of 1% Lidocaine without Epinephrine. A # 20 gauge, Tuohy needle was inserted into the epidural space using a paramedian approach.  The epidural space was localized using loss of resistance along with contralateral oblique bi-planar fluoroscopic views.  After negative aspirate for air, blood, and CSF, a 2 ml. volume of Isovue-250 was injected into the epidural space and the flow of contrast was observed. Radiographs were obtained for documentation purposes.   The injectate was administered into the level noted above.  Additional Comments:  The patient tolerated the procedure well Dressing: 2 x 2 sterile gauze and Band-Aid    Post-procedure details: Patient was observed during the procedure. Post-procedure  instructions were reviewed.  Patient left the clinic in stable condition.

## 2021-12-16 ENCOUNTER — Ambulatory Visit (AMBULATORY_SURGERY_CENTER): Payer: Medicare HMO | Admitting: Gastroenterology

## 2021-12-16 ENCOUNTER — Encounter: Payer: Self-pay | Admitting: Gastroenterology

## 2021-12-16 VITALS — BP 102/54 | HR 67 | Temp 97.3°F | Resp 11

## 2021-12-16 DIAGNOSIS — D124 Benign neoplasm of descending colon: Secondary | ICD-10-CM | POA: Diagnosis not present

## 2021-12-16 DIAGNOSIS — F32A Depression, unspecified: Secondary | ICD-10-CM | POA: Diagnosis not present

## 2021-12-16 DIAGNOSIS — D125 Benign neoplasm of sigmoid colon: Secondary | ICD-10-CM

## 2021-12-16 DIAGNOSIS — J45909 Unspecified asthma, uncomplicated: Secondary | ICD-10-CM | POA: Diagnosis not present

## 2021-12-16 DIAGNOSIS — E119 Type 2 diabetes mellitus without complications: Secondary | ICD-10-CM | POA: Diagnosis not present

## 2021-12-16 DIAGNOSIS — Z86010 Personal history of colonic polyps: Secondary | ICD-10-CM

## 2021-12-16 DIAGNOSIS — I10 Essential (primary) hypertension: Secondary | ICD-10-CM | POA: Diagnosis not present

## 2021-12-16 DIAGNOSIS — Z09 Encounter for follow-up examination after completed treatment for conditions other than malignant neoplasm: Secondary | ICD-10-CM | POA: Diagnosis not present

## 2021-12-16 DIAGNOSIS — D123 Benign neoplasm of transverse colon: Secondary | ICD-10-CM | POA: Diagnosis not present

## 2021-12-16 DIAGNOSIS — D122 Benign neoplasm of ascending colon: Secondary | ICD-10-CM | POA: Diagnosis not present

## 2021-12-16 MED ORDER — SODIUM CHLORIDE 0.9 % IV SOLN: 500.0000 mL | Freq: Once | INTRAVENOUS | Status: DC

## 2021-12-16 NOTE — Progress Notes (Signed)
History & Physical  Primary Care Physician:  Biagio Borg, MD Primary Gastroenterologist: Lucio Edward, MD  CHIEF COMPLAINT:  Personal history of colon polyps   HPI: Ethan Ellis is a 61 y.o. male with a personal history of adenomatous colon polyps in 2016 for surveillance colonoscopy.   Past Medical History:  Diagnosis Date   Depression    Diabetes mellitus without complication ([place])    type 2   History of kidney stones    passed stone - no surgery   Hyperlipidemia 03/10/2014   Hypertension    LBP (low back pain) 2009   Right   Unspecified viral hepatitis C without hepatic coma 04/04/2015    Past Surgical History:  Procedure Laterality Date   APPENDECTOMY     BACK SURGERY     lower back    COLONOSCOPY  12/06/2014   Dr.Jeromiah Ohalloran   INGUINAL HERNIA REPAIR Left 04/26/2019   Procedure: LEFT INGUINAL HERNIA REPAIR WITH MESH;  Surgeon: Donnie Mesa, MD;  Location: [place];  Service: General;  Laterality: Left;  LMA AND TAP BLOCK   LESION REMOVAL Left 04/26/2019   Procedure: EXCISION OF SKIN LESION LEFT GROIN AND BASE OF PENIS;  Surgeon: Donnie Mesa, MD;  Location: [place];  Service: General;  Laterality: Left;   REVERSE SHOULDER ARTHROPLASTY Left 12/25/2020   Procedure: LEFT REVERSE SHOULDER REPLACEMENT;  Surgeon: Meredith Pel, MD;  Location: [place];  Service: Orthopedics;  Laterality: Left;   SHOULDER SURGERY     right - x 3 surgery    Prior to Admission medications   Medication Sig Start Date End Date Taking? Authorizing Provider  acetaminophen (TYLENOL) 650 MG CR tablet Take 1,950 mg by mouth 2 (two) times daily.   Yes [provider]  albuterol (VENTOLIN HFA) 108 (90 Base) MCG/ACT inhaler Inhale 2 puffs into the lungs every 6 (six) hours as needed for wheezing or shortness of breath. 07/14/20  Yes Biagio Borg, MD  aspirin EC 81 MG tablet Take 1 tablet (81 mg total) by mouth daily. 02/22/14  Yes Biagio Borg, MD   atorvastatin (LIPITOR) 10 MG tablet Take 1 tablet (10 mg total) by mouth daily. 02/28/21  Yes Biagio Borg, MD  citalopram (CELEXA) 20 MG tablet Take 1 tablet (20 mg total) by mouth daily. Must keep June appt for future refills 08/05/21  Yes Biagio Borg, MD  glipiZIDE (GLUCOTROL XL) 10 MG 24 hr tablet Take 1 tablet by mouth once daily with breakfast 02/28/21  Yes Biagio Borg, MD  hydrochlorothiazide (HYDRODIURIL) 25 MG tablet Take 1 tablet (25 mg total) by mouth daily. 02/28/21  Yes Biagio Borg, MD  losartan (COZAAR) 100 MG tablet Take 1 tablet (100 mg total) by mouth daily. 02/28/21  Yes Biagio Borg, MD  metFORMIN (GLUCOPHAGE) 500 MG tablet 2 tab by mouth in the AM, and 1 in the PM 02/28/21  Yes Biagio Borg, MD  nortriptyline (PAMELOR) 50 MG capsule Take 1 capsule (50 mg total) by mouth 3 (three) times daily. 02/28/21  Yes Biagio Borg, MD  tiZANidine (ZANAFLEX) 4 MG tablet Take 12 mg by mouth at bedtime. 08/05/21  Yes Biagio Borg, MD  amoxicillin (AMOXIL) 500 MG tablet 2g 1 hour prior to dental procedure Patient not taking: Reported on 11/18/2021 10/21/21   Meredith Pel, MD  Blood Glucose Calibration (ACCU-CHEK GUIDE CONTROL) LIQD Use as directed once daily e11.9 06/13/20   Cathlean Cower  W, MD  Blood Glucose Monitoring Suppl (ACCU-CHEK GUIDE ME) w/Device KIT Use as directed once daily E11.9 06/13/20   Biagio Borg, MD  Cholecalciferol (VITAMIN D-3 PO) Take 1 capsule by mouth daily.    [provider]  clotrimazole-betamethasone (LOTRISONE) cream Use as directed to affected area twice daily as needed 09/14/16   Biagio Borg, MD  glucose blood (ACCU-CHEK GUIDE) test strip Use as instructed once daily E11.9 06/13/20   Biagio Borg, MD  Lancets MISC Use as directed once daily to check blood sugar.  Diagnosis code E11.9 03/12/14   Biagio Borg, MD  tamsulosin (FLOMAX) 0.4 MG CAPS capsule Take 1 capsule (0.4 mg total) by mouth daily. Must keep June appt for future refills 08/05/21    Biagio Borg, MD    Current Outpatient Medications  Medication Sig Dispense Refill   acetaminophen (TYLENOL) 650 MG CR tablet Take 1,950 mg by mouth 2 (two) times daily.     albuterol (VENTOLIN HFA) 108 (90 Base) MCG/ACT inhaler Inhale 2 puffs into the lungs every 6 (six) hours as needed for wheezing or shortness of breath. 54 g 3   aspirin EC 81 MG tablet Take 1 tablet (81 mg total) by mouth daily. 90 tablet 11   atorvastatin (LIPITOR) 10 MG tablet Take 1 tablet (10 mg total) by mouth daily. 90 tablet 3   citalopram (CELEXA) 20 MG tablet Take 1 tablet (20 mg total) by mouth daily. Must keep June appt for future refills 30 tablet 0   glipiZIDE (GLUCOTROL XL) 10 MG 24 hr tablet Take 1 tablet by mouth once daily with breakfast 90 tablet 3   hydrochlorothiazide (HYDRODIURIL) 25 MG tablet Take 1 tablet (25 mg total) by mouth daily. 90 tablet 3   losartan (COZAAR) 100 MG tablet Take 1 tablet (100 mg total) by mouth daily. 90 tablet 3   metFORMIN (GLUCOPHAGE) 500 MG tablet 2 tab by mouth in the AM, and 1 in the PM 270 tablet 3   nortriptyline (PAMELOR) 50 MG capsule Take 1 capsule (50 mg total) by mouth 3 (three) times daily. 270 capsule 1   tiZANidine (ZANAFLEX) 4 MG tablet Take 12 mg by mouth at bedtime. 90 tablet 0   amoxicillin (AMOXIL) 500 MG tablet 2g 1 hour prior to dental procedure (Patient not taking: Reported on 11/18/2021) 10 tablet 0   Blood Glucose Calibration (ACCU-CHEK GUIDE CONTROL) LIQD Use as directed once daily e11.9 1 each 5   Blood Glucose Monitoring Suppl (ACCU-CHEK GUIDE ME) w/Device KIT Use as directed once daily E11.9 1 kit 0   Cholecalciferol (VITAMIN D-3 PO) Take 1 capsule by mouth daily.     clotrimazole-betamethasone (LOTRISONE) cream Use as directed to affected area twice daily as needed 15 g 1   glucose blood (ACCU-CHEK GUIDE) test strip Use as instructed once daily E11.9 100 each 12   Lancets MISC Use as directed once daily to check blood sugar.  Diagnosis code E11.9  100 each 11   tamsulosin (FLOMAX) 0.4 MG CAPS capsule Take 1 capsule (0.4 mg total) by mouth daily. Must keep June appt for future refills 30 capsule 0   Current Facility-Administered Medications  Medication Dose Route Frequency Provider Last Rate Last Admin   0.9 %  sodium chloride infusion  500 mL Intravenous Once Ladene Artist, MD        Allergies as of 12/16/2021 - Review Complete 12/16/2021  Allergen Reaction Noted   Naproxen Swelling and Other (See Comments)  07/28/2007    Family History  Problem Relation Age of Onset   Diabetes Father    Colon cancer Neg Hx    Colon polyps Neg Hx    Rectal cancer Neg Hx    Stomach cancer Neg Hx     Social History   Socioeconomic History   Marital status: Married    Spouse name: Not on file   Number of children: Not on file   Years of education: Not on file   Highest education level: Not on file  Occupational History   Not on file  Tobacco Use   Smoking status: Every Day    Packs/day: 0.50    Years: 46.00    Total pack years: 23.00    Types: Cigarettes   Smokeless tobacco: Never  Vaping Use   Vaping Use: Never used  Substance and Sexual Activity   Alcohol use: No    Alcohol/week: 0.0 standard drinks of alcohol   Drug use: Not Currently   Sexual activity: Yes  Other Topics Concern   Not on file  Social History Narrative   Job w/Trucking company; disabled now 2010   Regular Exercise -  NO         Social Determinants of Health   Financial Resource Strain: Not on file  Food Insecurity: Not on file  Transportation Needs: Not on file  Physical Activity: Not on file  Stress: Not on file  Social Connections: Not on file  Intimate Partner Violence: Not on file    Review of Systems:  All systems reviewed were negative except where noted in HPI.   Physical Exam: General:  Alert, well-developed, in NAD Head:  Normocephalic and atraumatic. Eyes:  Sclera clear, no icterus.   Conjunctiva pink. Ears:  Normal auditory  acuity. Mouth:  No deformity or lesions.  Neck:  Supple; no masses . Lungs:  Clear throughout to auscultation.   No wheezes, crackles, or rhonchi. No acute distress. Heart:  Regular rate and rhythm; no murmurs. Abdomen:  Soft, nondistended, nontender. No masses, hepatomegaly. No obvious masses.  Normal bowel .    Rectal:  Deferred   Msk:  Symmetrical without gross deformities.. Pulses:  Normal pulses noted. Extremities:  Without edema. Neurologic:  Alert and  oriented x4;  grossly normal neurologically. Skin:  Intact without significant lesions or rashes. Cervical Nodes:  No significant cervical adenopathy. Psych:  Alert and cooperative. Normal mood and affect.   Impression / Plan:   Personal history of adenomatous colon polyps in 2016 for surveillance colonoscopy.  Pricilla Riffle. Fuller Plan  12/16/2021, 11:41 AM See Shea Evans, [facility], to contact our on call provider

## 2021-12-16 NOTE — Progress Notes (Signed)
Called to room to assist during endoscopic procedure.  Patient ID and intended procedure confirmed with present staff. Received instructions for my participation in the procedure from the performing physician.  

## 2021-12-16 NOTE — Op Note (Signed)
[facility] Patient Name: Antwine Agosto Procedure Date: 12/16/2021 11:39 AM MRN: [id number] Endoscopist: Ladene Artist , MD Age: 61 Referring MD:  Date of Birth: June 04, 1960 Gender: Male Account #: [id number] Procedure:                Colonoscopy Indications:              Surveillance: Personal history of adenomatous                            polyps on last colonoscopy > 5 years ago Medicines:                Monitored Anesthesia Care Procedure:                Pre-Anesthesia Assessment:                           - Prior to the procedure, a History and Physical                            was performed, and patient medications and                            allergies were reviewed. The patient's tolerance of                            previous anesthesia was also reviewed. The risks                            and benefits of the procedure and the sedation                            options and risks were discussed with the patient.                            All questions were answered, and informed consent                            was obtained. Prior Anticoagulants: The patient has                            taken no previous anticoagulant or antiplatelet                            agents. ASA Grade Assessment: III - A patient with                            severe systemic disease. After reviewing the risks                            and benefits, the patient was deemed in                            satisfactory condition to undergo the procedure.  After obtaining informed consent, the colonoscope                            was passed under direct vision. Throughout the                            procedure, the patient's blood pressure, pulse, and                            oxygen saturations were monitored continuously. The                            PCF-HQ190L Colonoscope was introduced through the                            anus and advanced to  the the cecum, identified by                            appendiceal orifice and ileocecal valve. The                            ileocecal valve, appendiceal orifice, and rectum                            were photographed. The quality of the bowel                            preparation was fair despite extensive lavage,                            suction. The colonoscopy was performed without                            difficulty. The patient tolerated the procedure                            well. Scope In: 11:51:56 AM Scope Out: 12:13:29 PM Scope Withdrawal Time: 0 hours 17 minutes 41 seconds  Total Procedure Duration: 0 hours 21 minutes 33 seconds  Findings:                 The perianal and digital rectal examinations were                            normal.                           Six sessile polyps were found in the sigmoid colon                            (1), descending colon (3), transverse colon (1) and                            ascending colon (1). The polyps were 8 to 12 mm in  size. These polyps were removed with a cold snare.                            Resection and retrieval were complete.                           There was a medium-sized lipoma, 20 mm in diameter,                            in the transverse colon.                           Multiple medium-mouthed diverticula were found in                            the left colon. There was narrowing of the colon in                            association with the diverticular opening. There                            was evidence of diverticular spasm. There was no                            evidence of diverticular bleeding.                           Internal hemorrhoids were found during                            retroflexion. The hemorrhoids were small and Grade                            I (internal hemorrhoids that do not prolapse).                           The exam was otherwise  without abnormality on                            direct and retroflexion views. Complications:            No immediate complications. Estimated blood loss:                            None. Estimated Blood Loss:     Estimated blood loss: none. Impression:               - Preparation of the colon was fair.                           - Six 8 to 12 mm polyps in the sigmoid colon, in                            the descending colon, in the transverse colon and  in the ascending colon, removed with a cold snare.                            Resected and retrieved.                           - Medium-sized lipoma in the transverse colon.                           - Moderate diverticulosis in the left colon.                           - Internal hemorrhoids.                           - The examination was otherwise normal on direct                            and retroflexion views. Recommendation:           - Repeat colonoscopy in 2-3 years after studies are                            complete for surveillance based on pathology                            results with an extended bowel prep.                           - Patient has a contact number available for                            emergencies. The signs and symptoms of potential                            delayed complications were discussed with the                            patient. Return to normal activities tomorrow.                            Written discharge instructions were provided to the                            patient.                           - High fiber diet.                           - Continue present medications.                           - Await pathology results. Ladene Artist, MD 12/16/2021 12:19:36 PM This report has been signed electronically.

## 2021-12-16 NOTE — Patient Instructions (Signed)
YOU HAD AN ENDOSCOPIC PROCEDURE TODAY AT [facility]:   Refer to the procedure report that was given to you for any specific questions about what was found during the examination.  If the procedure report does not answer your questions, please call your gastroenterologist to clarify.  If you requested that your care partner not be given the details of your procedure findings, then the procedure report has been included in a sealed envelope for you to review at your convenience later.  **Handout given on polyps, hemorrhoids, Diverticulosis and high fiber diet**  YOU SHOULD EXPECT: Some feelings of bloating in the abdomen. Passage of more gas than usual.  Walking can help get rid of the air that was put into your GI tract during the procedure and reduce the bloating. If you had a lower endoscopy (such as a colonoscopy or flexible sigmoidoscopy) you may notice spotting of blood in your stool or on the toilet paper. If you underwent a bowel prep for your procedure, you may not have a normal bowel movement for a few days.  Please Note:  You might notice some irritation and congestion in your nose or some drainage.  This is from the oxygen used during your procedure.  There is no need for concern and it should clear up in a day or so.  SYMPTOMS TO REPORT IMMEDIATELY:  Following lower endoscopy (colonoscopy or flexible sigmoidoscopy):  Excessive amounts of blood in the stool  Significant tenderness or worsening of abdominal pains  Swelling of the abdomen that is new, acute  Fever of 100F or higher  For urgent or emergent issues, a gastroenterologist can be reached at any hour by calling [phone]. Do not use MyChart messaging for urgent concerns.    DIET:  We do recommend a small meal at first, but then you may proceed to your regular diet.  Drink plenty of fluids but you should avoid alcoholic beverages for 24 hours.  ACTIVITY:  You should plan to take it easy for the rest of  today and you should NOT DRIVE or use heavy machinery until tomorrow (because of the sedation medicines used during the test).    FOLLOW UP: Our staff will call the number listed on your records the next business day following your procedure.  We will call around 7:15- 8:00 am to check on you and address any questions or concerns that you may have regarding the information given to you following your procedure. If we do not reach you, we will leave a message.     If any biopsies were taken you will be contacted by phone or by letter within the next 1-3 weeks.  Please call us at [phone] if you have not heard about the biopsies in 3 weeks.   Colonoscopy in 2-3 years based on pathology  SIGNATURES/CONFIDENTIALITY: You and/or your care partner have signed paperwork which will be entered into your electronic medical record.  These signatures attest to the fact that that the information above on your After Visit Summary has been reviewed and is understood.  Full responsibility of the confidentiality of this discharge information lies with you and/or your care-partner.

## 2021-12-16 NOTE — Progress Notes (Signed)
Pt's states no medical or surgical changes since previsit or office visit. 

## 2021-12-16 NOTE — Progress Notes (Signed)
Pt awake, alert and oriented. VSS. Airway intact. SBAR complete to RN. All questions answered.  

## 2021-12-17 ENCOUNTER — Telehealth: Payer: Self-pay

## 2021-12-17 NOTE — Telephone Encounter (Signed)
  Follow up Call-     12/16/2021   10:48 AM  Call back number  Post procedure Call Back phone  # [phone]  Permission to leave phone message Yes     Patient questions:  Do you have a fever, pain , or abdominal swelling? No. Pain Score  0 *  Have you tolerated food without any problems? Yes.    Have you been able to return to your normal activities? Yes.    Do you have any questions about your discharge instructions: Diet   No. Medications  No. Follow up visit  No.  Do you have questions or concerns about your Care? No.  Actions: * If pain score is 4 or above: No action needed, pain <4.

## 2021-12-23 ENCOUNTER — Encounter: Payer: Self-pay | Admitting: Gastroenterology

## 2022-01-06 ENCOUNTER — Other Ambulatory Visit: Payer: Self-pay | Admitting: Internal Medicine

## 2022-02-03 ENCOUNTER — Ambulatory Visit (INDEPENDENT_AMBULATORY_CARE_PROVIDER_SITE_OTHER): Payer: Medicare HMO | Admitting: Internal Medicine

## 2022-02-03 ENCOUNTER — Encounter: Payer: Self-pay | Admitting: Internal Medicine

## 2022-02-03 VITALS — BP 120/68 | HR 86 | Temp 98.3°F | Ht 69.5 in | Wt 184.0 lb

## 2022-02-03 DIAGNOSIS — M24541 Contracture, right hand: Secondary | ICD-10-CM | POA: Diagnosis not present

## 2022-02-03 DIAGNOSIS — E1165 Type 2 diabetes mellitus with hyperglycemia: Secondary | ICD-10-CM | POA: Diagnosis not present

## 2022-02-03 DIAGNOSIS — E785 Hyperlipidemia, unspecified: Secondary | ICD-10-CM

## 2022-02-03 DIAGNOSIS — E559 Vitamin D deficiency, unspecified: Secondary | ICD-10-CM | POA: Diagnosis not present

## 2022-02-03 MED ORDER — NORTRIPTYLINE HCL 50 MG PO CAPS: 50.0000 mg | ORAL_CAPSULE | Freq: Three times a day (TID) | ORAL | 1 refills | Status: DC

## 2022-02-03 NOTE — Assessment & Plan Note (Signed)
Lab Results  Component Value Date   LDLCALC 71 09/08/2021   Stable, pt to continue current statin lipitor 10 mg qd

## 2022-02-03 NOTE — Patient Instructions (Signed)
Please continue all other medications as before, and refills have been done if requested.  Please have the pharmacy call with any other refills you may need.  Please continue your efforts at being more active, low cholesterol diet, and weight control..  Please keep your appointments with your specialists as you may have planned  You will be contacted regarding the referral for: Hand surgury for after Jan 1  Please go to the LAB at the blood drawing area for the tests to be done  You will be contacted by phone if any changes need to be made immediately.  Otherwise, you will receive a letter about your results with an explanation, but please check with MyChart first.  Please remember to sign up for MyChart if you have not done so, as this will be important to you in the future with finding out test results, communicating by private email, and scheduling acute appointments online when needed.  Please make an Appointment to return in 6 months, or sooner if needed

## 2022-02-03 NOTE — Assessment & Plan Note (Signed)
Pt for hand surgury referral after jan 1 per pt request due to insurance coverage

## 2022-02-03 NOTE — Assessment & Plan Note (Signed)
Lab Results  Component Value Date   HGBA1C 6.3 09/08/2021   Stable, pt to continue current medical treatment glucotrol xl 10 qd, metformin 500 gm - 2am, 1 pm

## 2022-02-03 NOTE — Progress Notes (Signed)
Patient ID: Ethan Ellis, male   DOB: 05/04/1960, 61 y.o.   MRN: [id number]        Chief Complaint: follow up right middle finger pain, chronic back pain, dm, hld, low vit d      HPI:  Ethan Ellis is a 61 y.o. male here with c/o 1 mo worsening right middle finger pain and now contracture and palm pain swelling, without recent trauma or fever.  Pt denies chest pain, increased sob or doe, wheezing, orthopnea, PND, increased LE swelling, palpitations, dizziness or syncope.  Pt continues to have recurring LBP without change in severity, bowel or bladder change, fever, wt loss,  worsening LE pain/numbness/weakness, gait change or falls - ran out of pamelor, needs urgent refill per pt.  Still smoking, not ready to quit.         Wt Readings from Last 3 Encounters:  02/03/22 184 lb (83.5 kg)  11/18/21 184 lb (83.5 kg)  09/08/21 175 lb (79.4 kg)   BP Readings from Last 3 Encounters:  02/03/22 120/68  12/16/21 (!) 102/54  12/10/21 117/75         Past Medical History:  Diagnosis Date   Depression    Diabetes mellitus without complication ([place])    type 2   History of kidney stones    passed stone - no surgery   Hyperlipidemia 03/10/2014   Hypertension    LBP (low back pain) 2009   Right   Unspecified viral hepatitis C without hepatic coma 04/04/2015   Past Surgical History:  Procedure Laterality Date   APPENDECTOMY     BACK SURGERY     lower back    COLONOSCOPY  12/06/2014   Dr.Stark   INGUINAL HERNIA REPAIR Left 04/26/2019   Procedure: LEFT INGUINAL HERNIA REPAIR WITH MESH;  Surgeon: Donnie Mesa, MD;  Location: [facility];  Service: General;  Laterality: Left;  LMA AND TAP BLOCK   LESION REMOVAL Left 04/26/2019   Procedure: EXCISION OF SKIN LESION LEFT GROIN AND BASE OF PENIS;  Surgeon: Donnie Mesa, MD;  Location: [facility];  Service: General;  Laterality: Left;   REVERSE SHOULDER ARTHROPLASTY Left 12/25/2020   Procedure: LEFT REVERSE SHOULDER  REPLACEMENT;  Surgeon: Meredith Pel, MD;  Location: [facility];  Service: Orthopedics;  Laterality: Left;   SHOULDER SURGERY     right - x 3 surgery    reports that he has been smoking cigarettes. He has a 23.00 pack-year smoking history. He has never used smokeless tobacco. He reports that he does not currently use drugs. He reports that he does not drink alcohol. family history includes Diabetes in his father. Allergies  Allergen Reactions   Naproxen Swelling and Other (See Comments)    Mouth swelling- patient can take advil and some other NSAIDs w/o problems, however   Current Outpatient Medications on File Prior to Visit  Medication Sig Dispense Refill   acetaminophen (TYLENOL) 650 MG CR tablet Take 1,950 mg by mouth 2 (two) times daily.     albuterol (VENTOLIN HFA) 108 (90 Base) MCG/ACT inhaler Inhale 2 puffs into the lungs every 6 (six) hours as needed for wheezing or shortness of breath. 54 g 3   aspirin EC 81 MG tablet Take 1 tablet (81 mg total) by mouth daily. 90 tablet 11   atorvastatin (LIPITOR) 10 MG tablet Take 1 tablet (10 mg total) by mouth daily. 90 tablet 3   Blood Glucose Calibration (ACCU-CHEK GUIDE CONTROL) LIQD Use as  directed once daily e11.9 1 each 5   Blood Glucose Monitoring Suppl (ACCU-CHEK GUIDE ME) w/Device KIT Use as directed once daily E11.9 1 kit 0   Cholecalciferol (VITAMIN D-3 PO) Take 1 capsule by mouth daily.     citalopram (CELEXA) 20 MG tablet Take 1 tablet (20 mg total) by mouth daily. Must keep June appt for future refills 30 tablet 0   clotrimazole-betamethasone (LOTRISONE) cream Use as directed to affected area twice daily as needed 15 g 1   glipiZIDE (GLUCOTROL XL) 10 MG 24 hr tablet Take 1 tablet by mouth once daily with breakfast 90 tablet 3   glucose blood (ACCU-CHEK GUIDE) test strip Use as instructed once daily E11.9 100 each 12   hydrochlorothiazide (HYDRODIURIL) 25 MG tablet Take 1 tablet (25 mg total) by mouth daily. 90 tablet 3   Lancets  MISC Use as directed once daily to check blood sugar.  Diagnosis code E11.9 100 each 11   losartan (COZAAR) 100 MG tablet Take 1 tablet (100 mg total) by mouth daily. 90 tablet 3   metFORMIN (GLUCOPHAGE) 500 MG tablet 2 tab by mouth in the AM, and 1 in the PM 270 tablet 3   tamsulosin (FLOMAX) 0.4 MG CAPS capsule Take 1 capsule (0.4 mg total) by mouth daily. Must keep June appt for future refills 30 capsule 0   tiZANidine (ZANAFLEX) 4 MG tablet TAKE 3 TABLETS AT BEDTIME 270 tablet 10   No current facility-administered medications on file prior to visit.        ROS:  All others reviewed and negative.  Objective        PE:  BP 120/68 (BP Location: Right Arm, Patient Position: Sitting, Cuff Size: Large)   Pulse 86   Temp 98.3 F (36.8 C) (Oral)   Ht 5' 9.5" (1.765 m)   Wt 184 lb (83.5 kg)   SpO2 94%   BMI 26.78 kg/m                 Constitutional: Pt appears in NAD               HENT: Head: NCAT.                Right Ear: External ear normal.                 Left Ear: External ear normal.                Eyes: . Pupils are equal, round, and reactive to light. Conjunctivae and EOM are normal               Nose: without d/c or deformity               Neck: Neck supple. Gross normal ROM               Cardiovascular: Normal rate and regular rhythm.                 Pulmonary/Chest: Effort normal and breath sounds without rales or wheezing.                Abd:  Soft, NT, ND, + BS, no organomegaly               Right middle finger duptryns contracture tender noted               Neurological: Pt is alert. At baseline orientation, motor grossly intact  Skin: Skin is warm. No rashes, no other new lesions, LE edema - none               Psychiatric: Pt behavior is normal without agitation   Micro: none  Cardiac tracings I have personally interpreted today:  none  Pertinent Radiological findings (summarize): none   Lab Results  Component Value Date   WBC 10.8 (H) 09/08/2021    HGB 15.6 09/08/2021   HCT 45.6 09/08/2021   PLT 230.0 09/08/2021   GLUCOSE 69 (L) 09/08/2021   CHOL 140 09/08/2021   TRIG 160.0 (H) 09/08/2021   HDL 36.60 (L) 09/08/2021   LDLDIRECT 91.0 07/14/2020   LDLCALC 71 09/08/2021   ALT 15 09/08/2021   AST 9 09/08/2021   NA 138 09/08/2021   K 3.8 09/08/2021   CL 100 09/08/2021   CREATININE 0.97 09/08/2021   BUN 16 09/08/2021   CO2 27 09/08/2021   TSH 0.68 09/08/2021   PSA 1.12 09/08/2021   HGBA1C 6.3 09/08/2021   MICROALBUR <0.7 09/08/2021   Assessment/Plan:  Ethan Ellis is a 61 y.o. White or Caucasian [1] male with  has a past medical history of Depression, Diabetes mellitus without complication ([place]), History of kidney stones, Hyperlipidemia (03/10/2014), Hypertension, LBP (low back pain) (2009), and Unspecified viral hepatitis C without hepatic coma (04/04/2015).  Contracture of finger joint, right Pt for hand surgury referral after jan 1 per pt request due to insurance coverage  Vitamin D deficiency Last vitamin D Lab Results  Component Value Date   VD25OH 23.70 (L) 09/08/2021   Low, to start oral replacement   Hyperlipidemia Lab Results  Component Value Date   LDLCALC 71 09/08/2021   Stable, pt to continue current statin lipitor 10 mg qd   Diabetes ([place]) Lab Results  Component Value Date   HGBA1C 6.3 09/08/2021   Stable, pt to continue current medical treatment glucotrol xl 10 qd, metformin 500 gm - 2am, 1 pm  Followup: Return in about 6 months (around 08/04/2022).  Cathlean Cower, MD 02/03/2022 9:18 PM [facility]

## 2022-02-03 NOTE — Assessment & Plan Note (Signed)
Last vitamin D Lab Results  Component Value Date   VD25OH 23.70 (L) 09/08/2021   Low, to start oral replacement

## 2022-02-04 ENCOUNTER — Encounter: Payer: Self-pay | Admitting: Internal Medicine

## 2022-02-04 LAB — CBC WITH DIFFERENTIAL/PLATELET
Absolute Monocytes: 873 cells/uL (ref 200–950)
Basophils Absolute: 58 cells/uL (ref 0–200)
Basophils Relative: 0.6 %
Eosinophils Absolute: 301 cells/uL (ref 15–500)
Eosinophils Relative: 3.1 %
HCT: 46.8 % (ref 38.5–50.0)
Hemoglobin: 16.5 g/dL (ref 13.2–17.1)
Lymphs Abs: 3220 cells/uL (ref 850–3900)
MCH: 32.7 pg (ref 27.0–33.0)
MCHC: 35.3 g/dL (ref 32.0–36.0)
MCV: 92.9 fL (ref 80.0–100.0)
MPV: 9.9 fL (ref 7.5–12.5)
Monocytes Relative: 9 %
Neutro Abs: 5248 cells/uL (ref 1500–7800)
Neutrophils Relative %: 54.1 %
Platelets: 311 10*3/uL (ref 140–400)
RBC: 5.04 10*6/uL (ref 4.20–5.80)
RDW: 11.8 % (ref 11.0–15.0)
Total Lymphocyte: 33.2 %
WBC: 9.7 10*3/uL (ref 3.8–10.8)

## 2022-02-04 LAB — LIPID PANEL
Cholesterol: 138 mg/dL (ref <200)
HDL: 38 mg/dL — ABNORMAL LOW (ref >=40)
LDL Cholesterol (Calc): 72 mg/dL (calc)
Non-HDL Cholesterol (Calc): 100 mg/dL (calc) (ref <130)
Total CHOL/HDL Ratio: 3.6 (calc) (ref <5.0)
Triglycerides: 185 mg/dL — ABNORMAL HIGH (ref <150)

## 2022-02-04 LAB — BASIC METABOLIC PANEL
BUN: 16 mg/dL (ref 7–25)
CO2: 22 mmol/L (ref 20–32)
Calcium: 9.7 mg/dL (ref 8.6–10.3)
Chloride: 101 mmol/L (ref 98–110)
Creat: 0.77 mg/dL (ref 0.70–1.35)
Glucose, Bld: 74 mg/dL (ref 65–99)
Potassium: 3.7 mmol/L (ref 3.5–5.3)
Sodium: 138 mmol/L (ref 135–146)

## 2022-02-04 LAB — HEPATIC FUNCTION PANEL
AG Ratio: 2 (calc) (ref 1.0–2.5)
ALT: 21 U/L (ref 9–46)
AST: 12 U/L (ref 10–35)
Albumin: 4.8 g/dL (ref 3.6–5.1)
Alkaline phosphatase (APISO): 96 U/L (ref 35–144)
Bilirubin, Direct: 0.1 mg/dL (ref 0.0–0.2)
Globulin: 2.4 g/dL (calc) (ref 1.9–3.7)
Indirect Bilirubin: 0.4 mg/dL (calc) (ref 0.2–1.2)
Total Bilirubin: 0.5 mg/dL (ref 0.2–1.2)
Total Protein: 7.2 g/dL (ref 6.1–8.1)

## 2022-02-04 LAB — URINALYSIS, ROUTINE W REFLEX MICROSCOPIC
Bilirubin Urine: NEGATIVE
Glucose, UA: NEGATIVE
Hgb urine dipstick: NEGATIVE
Ketones, ur: NEGATIVE
Leukocytes,Ua: NEGATIVE
Nitrite: NEGATIVE
Protein, ur: NEGATIVE
Specific Gravity, Urine: 1.023 (ref 1.001–1.035)
pH: 6.5 (ref 5.0–8.0)

## 2022-02-04 LAB — TSH: TSH: 0.94 mIU/L (ref 0.40–4.50)

## 2022-02-04 LAB — MICROALBUMIN / CREATININE URINE RATIO
Creatinine, Urine: 125 mg/dL (ref 20–320)
Microalb Creat Ratio: 14 mcg/mg creat (ref <30)
Microalb, Ur: 1.7 mg/dL

## 2022-02-04 LAB — HEMOGLOBIN A1C
Hgb A1c MFr Bld: 6.4 % of total Hgb — ABNORMAL HIGH (ref <5.7)
Mean Plasma Glucose: 137 mg/dL
eAG (mmol/L): 7.6 mmol/L

## 2022-02-04 LAB — VITAMIN D 25 HYDROXY (VIT D DEFICIENCY, FRACTURES): Vit D, 25-Hydroxy: 14 ng/mL — ABNORMAL LOW (ref 30–100)

## 2022-03-21 ENCOUNTER — Other Ambulatory Visit: Payer: Self-pay | Admitting: Internal Medicine

## 2022-03-21 NOTE — Telephone Encounter (Signed)
Please refill as per office routine med refill policy (all routine meds to be refilled for 3 mo or monthly (per pt preference) up to one year from last visit, then month to month grace period for 3 mo, then further med refills will have to be denied) ? ?

## 2022-03-22 DIAGNOSIS — M24541 Contracture, right hand: Secondary | ICD-10-CM | POA: Diagnosis not present

## 2022-03-22 DIAGNOSIS — M65331 Trigger finger, right middle finger: Secondary | ICD-10-CM | POA: Diagnosis not present

## 2022-03-22 DIAGNOSIS — M720 Palmar fascial fibromatosis [Dupuytren]: Secondary | ICD-10-CM | POA: Diagnosis not present

## 2022-05-10 ENCOUNTER — Other Ambulatory Visit: Payer: Self-pay | Admitting: Internal Medicine

## 2022-05-12 ENCOUNTER — Telehealth: Payer: Self-pay

## 2022-05-12 NOTE — Telephone Encounter (Signed)
Contacted Casilda Carls to schedule their annual wellness visit. Appointment made for 05/18/22.  Norton Blizzard, [place] ([facility])  [facility] [phone]

## 2022-05-17 NOTE — Progress Notes (Unsigned)
Subjective:   [name] is a 62 y.o. male who presents for Medicare Initial preventive examination.  I connected with  Ethan Ellis on 05/18/22 by a audio enabled telemedicine application and verified that I am speaking with the correct person using two identifiers.  Patient Location: Home  Provider Location: Home Office  I discussed the limitations of evaluation and management by telemedicine. The patient expressed understanding and agreed to proceed.  Review of Systems     Cardiac Risk Factors include: advanced age (>44mn, >>10women);diabetes mellitus;dyslipidemia;hypertension;male gender     Objective:    Today's Vitals   05/18/22 1110  Weight: 184 lb (83.5 kg)  Height: 5' 9.5" (1.765 m)   Body mass index is 26.78 kg/m.     05/18/2022   11:13 AM 12/23/2020   10:17 AM 04/26/2019    7:59 AM 04/19/2019   12:10 PM 03/20/2019    8:48 AM 11/22/2014   11:23 AM  Advanced Directives  Does Patient Have a Medical Advance Directive? No No No No No No  Would patient like information on creating a medical advance directive? Yes (MAU/Ambulatory/Procedural Areas - Information given) No - Patient declined No - Patient declined  No - Patient declined     Current Medications (verified) Outpatient Encounter Medications as of 05/18/2022  Medication Sig   acetaminophen (TYLENOL) 650 MG CR tablet Take 1,950 mg by mouth 2 (two) times daily.   albuterol (VENTOLIN HFA) 108 (90 Base) MCG/ACT inhaler Inhale 2 puffs into the lungs every 6 (six) hours as needed for wheezing or shortness of breath.   aspirin EC 81 MG tablet Take 1 tablet (81 mg total) by mouth daily.   atorvastatin (LIPITOR) 10 MG tablet TAKE 1 TABLET EVERY DAY   Blood Glucose Calibration (ACCU-CHEK GUIDE CONTROL) LIQD Use as directed once daily e11.9   Blood Glucose Monitoring Suppl (ACCU-CHEK GUIDE ME) w/Device KIT Use as directed once daily E11.9   Cholecalciferol (VITAMIN D-3 PO) Take 1 capsule by mouth daily.   citalopram  (CELEXA) 20 MG tablet TAKE 1 TABLET EVERY DAY   clotrimazole-betamethasone (LOTRISONE) cream Use as directed to affected area twice daily as needed   glipiZIDE (GLUCOTROL XL) 10 MG 24 hr tablet TAKE 1 TABLET ONE TIME DAILY WITH BREAKFAST   glucose blood (ACCU-CHEK GUIDE) test strip Use as instructed once daily E11.9   hydrochlorothiazide (HYDRODIURIL) 25 MG tablet TAKE 1 TABLET EVERY DAY   Lancets MISC Use as directed once daily to check blood sugar.  Diagnosis code E11.9   losartan (COZAAR) 100 MG tablet TAKE 1 TABLET EVERY DAY   metFORMIN (GLUCOPHAGE) 500 MG tablet TAKE 2 TABLETS EVERY MORNING AND TAKE 1 TABLET EVERY EVENING   nortriptyline (PAMELOR) 50 MG capsule Take 1 capsule (50 mg total) by mouth 3 (three) times daily.   tamsulosin (FLOMAX) 0.4 MG CAPS capsule TAKE 1 CAPSULE EVERY DAY   tiZANidine (ZANAFLEX) 4 MG tablet TAKE 3 TABLETS AT BEDTIME   No facility-administered encounter medications on file as of 05/18/2022.    Allergies (verified) Naproxen   History: Past Medical History:  Diagnosis Date   Depression    Diabetes mellitus without complication ([place]    type 2   History of kidney stones    passed stone - no surgery   Hyperlipidemia 03/10/2014   Hypertension    LBP (low back pain) 2009   Right   Unspecified viral hepatitis C without hepatic coma 04/04/2015   Past Surgical History:  Procedure Laterality  Date   APPENDECTOMY     BACK SURGERY     lower back    COLONOSCOPY  12/06/2014   Dr.Stark   INGUINAL HERNIA REPAIR Left 04/26/2019   Procedure: LEFT INGUINAL HERNIA REPAIR WITH MESH;  Surgeon: Donnie Mesa, MD;  Location: [facility];  Service: General;  Laterality: Left;  LMA AND TAP BLOCK   LESION REMOVAL Left 04/26/2019   Procedure: EXCISION OF SKIN LESION LEFT GROIN AND BASE OF PENIS;  Surgeon: Donnie Mesa, MD;  Location: [facility];  Service: General;  Laterality: Left;   REVERSE SHOULDER ARTHROPLASTY Left 12/25/2020    Procedure: LEFT REVERSE SHOULDER REPLACEMENT;  Surgeon: Meredith Pel, MD;  Location: [facility];  Service: Orthopedics;  Laterality: Left;   SHOULDER SURGERY     right - x 3 surgery   Family History  Problem Relation Age of Onset   Diabetes Father    Colon cancer Neg Hx    Colon polyps Neg Hx    Rectal cancer Neg Hx    Stomach cancer Neg Hx    Social History   Socioeconomic History   Marital status: Married    Spouse name: Not on file   Number of children: Not on file   Years of education: Not on file   Highest education level: Not on file  Occupational History   Not on file  Tobacco Use   Smoking status: Every Day    Packs/day: 0.50    Years: 46.00    Total pack years: 23.00    Types: Cigarettes   Smokeless tobacco: Never  Vaping Use   Vaping Use: Never used  Substance and Sexual Activity   Alcohol use: No    Alcohol/week: 0.0 standard drinks of alcohol   Drug use: Not Currently   Sexual activity: Yes  Other Topics Concern   Not on file  Social History Narrative   Job w/Trucking company; disabled now 2010   Regular Exercise -  NO         Social Determinants of Health   Financial Resource Strain: Low Risk  (05/18/2022)   Overall Financial Resource Strain (CARDIA)    Difficulty of Paying Living Expenses: Not hard at all  Food Insecurity: No Food Insecurity (05/18/2022)   Hunger Vital Sign    Worried About Running Out of Food in the Last Year: Never true    Ran Out of Food in the Last Year: Never true  Transportation Needs: No Transportation Needs (05/18/2022)   PRAPARE - Hydrologist (Medical): No    Lack of Transportation (Non-Medical): No  Physical Activity: Insufficiently Active (05/18/2022)   Exercise Vital Sign    Days of Exercise per Week: 3 days    Minutes of Exercise per Session: 30 min  Stress: No Stress Concern Present (05/18/2022)   [place]    Feeling of  Stress : Not at all  Social Connections: Unknown (05/18/2022)   Social Connection and Isolation Panel [NHANES]    Frequency of Communication with Friends and Family: More than three times a week    Frequency of Social Gatherings with Friends and Family: Three times a week    Attends Religious Services: 1 to 4 times per year    Active Member of Clubs or Organizations: No    Attends Archivist Meetings: Never    Marital Status: Patient refused    Tobacco Counseling Ready to quit:  Not Answered Counseling given: Not Answered   Clinical Intake:  Pre-visit preparation completed: Yes  Pain : No/denies pain  Diabetes: Yes CBG done?: No Did pt. bring in CBG monitor from home?: No  How often do you need to have someone help you when you read instructions, pamphlets, or other written materials from your doctor or pharmacy?: 1 - Never  Diabetic?Yes   Nutrition Risk Assessment:  Has the patient had any N/V/D within the last 2 months?  No  Does the patient have any non-healing wounds?  No  Has the patient had any unintentional weight loss or weight gain?  No   Diabetes:  Is the patient diabetic?  Yes  If diabetic, was a CBG obtained today?  No  Did the patient bring in their glucometer from home?  No  How often do you monitor your CBG's? daily.   Financial Strains and Diabetes Management:  Are you having any financial strains with the device, your supplies or your medication? No .  Does the patient want to be seen by Chronic Care Management for management of their diabetes?  No  Would the patient like to be referred to a Nutritionist or for Diabetic Management?  No   Diabetic Exams:  Diabetic Eye Exam: Completed records requested Diabetic Foot Exam: Completed 09/08/21   Interpreter Needed?: No  Information entered by :: Denman George LPN   Activities of Daily Living    05/18/2022   11:14 AM  In your present state of health, do you have any difficulty performing  the following activities:  Hearing? 0  Vision? 0  Difficulty concentrating or making decisions? 0  Walking or climbing stairs? 0  Dressing or bathing? 0  Doing errands, shopping? 0  Preparing Food and eating ? N  Using the Toilet? N  In the past six months, have you accidently leaked urine? N  Do you have problems with loss of bowel control? N  Managing your Medications? N  Managing your Finances? N  Housekeeping or managing your Housekeeping? N    Patient Care Team: Biagio Borg, MD as PCP - General (Internal Medicine) [facility] Irine Seal, MD as Attending Physician (Urology) Leanora Cover, MD as Consulting Physician (Orthopedic Surgery)  Indicate any recent Medical Services you may have received from other than [facility] providers in the past year (date may be approximate).     Assessment:   This is a routine wellness examination for Ethan Ellis.  Hearing/Vision screen Hearing Screening - Comments:: Denies hearing difficulties  Vision Screening - Comments:: Wears rx glasses - up to date with routine eye exams with [place] issues and exercise activities discussed: Current Exercise Habits: Home exercise routine, Type of exercise: walking, Time (Minutes): 30, Frequency (Times/Week): 3, Weekly Exercise (Minutes/Week): 90, Intensity: Mild   Goals Addressed             This Visit's Progress    Remain active         Depression Screen    05/18/2022   11:12 AM 02/03/2022    3:28 PM 09/08/2021    1:37 PM 07/14/2020    3:42 PM 01/07/2020    4:43 PM 05/23/2017    2:29 PM 04/01/2015    3:21 PM  PHQ 2/9 Scores  PHQ - 2 Score 0  0 0 3 0 0  PHQ- 9 Score   2  7    Exception Documentation  Patient refusal  Fall Risk    05/18/2022   11:11 AM 02/03/2022    3:28 PM 09/08/2021    1:37 PM 07/14/2020    3:41 PM 06/11/2019    3:31 PM  Fall Risk   Falls in the past year? 0 0 0 1 1  Comment    fell last Tuesday   Number falls in past yr: 0  0 0  0  Injury with Fall? 0  0 1 0  Comment    sprain ankle   Risk for fall due to : No Fall Risks No Fall Risks     Follow up Falls prevention discussed;Education provided;Falls evaluation completed Falls evaluation completed       FALL RISK PREVENTION PERTAINING TO THE HOME:  Any stairs in or around the home? No  If so, are there any without handrails? No  Home free of loose throw rugs in walkways, pet beds, electrical cords, etc? Yes  Adequate lighting in your home to reduce risk of falls? Yes   ASSISTIVE DEVICES UTILIZED TO PREVENT FALLS:  Life alert? No  Use of a cane, walker or w/c? No  Grab bars in the bathroom? Yes  Shower chair or bench in shower? No  Elevated toilet seat or a handicapped toilet? Yes   TIMED UP AND GO:  Was the test performed? No . Telephonic visit   Cognitive Function:        05/18/2022   11:14 AM  6CIT Screen  What Year? 0 points  What month? 0 points  What time? 0 points  Count back from 20 0 points  Months in reverse 0 points  Repeat phrase 0 points  Total Score 0 points    Immunizations Immunization History  Administered Date(s) Administered   Influenza,inj,Quad PF,6+ Mos 02/22/2014, 04/01/2015, 11/23/2016, 11/24/2017, 12/06/2018   Influenza-Unspecified 04/15/2016, 11/23/2021   Pneumococcal Conjugate-13 04/22/2016   Pneumococcal Polysaccharide-23 05/04/2016   RSV,unspecified 11/23/2021   Tdap 09/14/2016   Zoster Recombinat (Shingrix) 06/18/2017, 08/19/2017    TDAP status: Up to date  Flu Vaccine status: Up to date  Pneumococcal vaccine status: Up to date  Covid-19 vaccine status: Information provided on how to obtain vaccines.   Qualifies for Shingles Vaccine? Yes   Zostavax completed No   Shingrix Completed?: Yes  Screening Tests Health Maintenance  Topic Date Due   OPHTHALMOLOGY EXAM  01/27/2022   HEMOGLOBIN A1C  08/04/2022   FOOT EXAM  09/09/2022   Lung Cancer Screening  09/29/2022   Diabetic kidney evaluation - eGFR  measurement  02/04/2023   Diabetic kidney evaluation - Urine ACR  02/04/2023   Medicare Annual Wellness (AWV)  05/18/2023   COLONOSCOPY (Pts 45-63yr Insurance coverage will need to be confirmed)  12/16/2024   DTaP/Tdap/Td (2 - Td or Tdap) 09/15/2026   INFLUENZA VACCINE  Completed   Hepatitis C Screening  Completed   HIV Screening  Completed   Zoster Vaccines- Shingrix  Completed   HPV VACCINES  Aged Out   COVID-19 Vaccine  Discontinued    Health Maintenance  Health Maintenance Due  Topic Date Due   OPHTHALMOLOGY EXAM  01/27/2022    Colorectal cancer screening: Type of screening: Colonoscopy. Completed 12/16/21. Repeat every 3 years  Lung Cancer Screening: (Low Dose CT Chest recommended if Age 64-80years, 30 pack-year currently smoking OR have quit w/in 15years.) does qualify.   Lung Cancer Screening Referral: Last 09/28/21  Additional Screening:  Hepatitis C Screening: does qualify; Completed 09/30/15  Vision Screening: Recommended annual ophthalmology exams  for early detection of glaucoma and other disorders of the eye. Is the patient up to date with their annual eye exam?  Yes  Who is the provider or what is the name of the office in which the patient attends annual eye exams? [facility] If pt is not established with a provider, would they like to be referred to a provider to establish care? No .   Dental Screening: Recommended annual dental exams for proper oral hygiene  Community Resource Referral / Chronic Care Management: CRR required this visit?  No   CCM required this visit?  No      Plan:     I have personally reviewed and noted the following in the patient's chart:   Medical and social history Use of alcohol, tobacco or illicit drugs  Current medications and supplements including opioid prescriptions. Patient is not currently taking opioid prescriptions. Functional ability and status Nutritional status Physical activity Advanced directives List of  other physicians Hospitalizations, surgeries, and ER visits in previous 12 months Vitals Screenings to include cognitive, depression, and falls Referrals and appointments  In addition, I have reviewed and discussed with patient certain preventive protocols, quality metrics, and best practice recommendations. A written personalized care plan for preventive services as well as general preventive health recommendations were provided to patient.     Vanetta Mulders, [place]   [id number]   Due to this being a virtual visit, the after visit summary with patients personalized plan was offered to patient via mail or my-chart. per request, patient was mailed a copy of AVS.  Nurse Notes: No concerns

## 2022-05-17 NOTE — Patient Instructions (Signed)
Ethan Ellis , Thank you for taking time to come for your Medicare Wellness Visit. I appreciate your ongoing commitment to your health goals. Please review the following plan we discussed and let me know if I can assist you in the future.   These are the goals we discussed:  Goals   None     This is a list of the screening recommended for you and due dates:  Health Maintenance  Topic Date Due   Medicare Annual Wellness Visit  Never done   Flu Shot  10/13/2021   Eye exam for diabetics  01/27/2022   Hemoglobin A1C  08/04/2022   Complete foot exam   09/09/2022   Screening for Lung Cancer  09/29/2022   Yearly kidney function blood test for diabetes  02/04/2023   Yearly kidney health urinalysis for diabetes  02/04/2023   Colon Cancer Screening  12/16/2024   DTaP/Tdap/Td vaccine (2 - Td or Tdap) 09/15/2026   Hepatitis C Screening: USPSTF Recommendation to screen - Ages 67-79 yo.  Completed   HIV Screening  Completed   Zoster (Shingles) Vaccine  Completed   HPV Vaccine  Aged Out   COVID-19 Vaccine  Discontinued    Advanced directives: ***  Conditions/risks identified: Aim for 30 minutes of exercise or brisk walking, 6-8 glasses of water, and 5 servings of fruits and vegetables each day.   Next appointment: Follow up in one year for your annual wellness visit.   Preventive Care 64 Years and Older, Male  Preventive care refers to lifestyle choices and visits with your health care provider that can promote health and wellness. What does preventive care include? A yearly physical exam. This is also called an annual well check. Dental exams once or twice a year. Routine eye exams. Ask your health care provider how often you should have your eyes checked. Personal lifestyle choices, including: Daily care of your teeth and gums. Regular physical activity. Eating a healthy diet. Avoiding tobacco and drug use. Limiting alcohol use. Practicing safe sex. Taking low doses of aspirin every  day. Taking vitamin and mineral supplements as recommended by your health care provider. What happens during an annual well check? The services and screenings done by your health care provider during your annual well check will depend on your age, overall health, lifestyle risk factors, and family history of disease. Counseling  Your health care provider may ask you questions about your: Alcohol use. Tobacco use. Drug use. Emotional well-being. Home and relationship well-being. Sexual activity. Eating habits. History of falls. Memory and ability to understand (cognition). Work and work Statistician. Screening  You may have the following tests or measurements: Height, weight, and BMI. Blood pressure. Lipid and cholesterol levels. These may be checked every 5 years, or more frequently if you are over 63 years old. Skin check. Lung cancer screening. You may have this screening every year starting at age 2 if you have a 30-pack-year history of smoking and currently smoke or have quit within the past 15 years. Fecal occult blood test (FOBT) of the stool. You may have this test every year starting at age 22. Flexible sigmoidoscopy or colonoscopy. You may have a sigmoidoscopy every 5 years or a colonoscopy every 10 years starting at age 80. Prostate cancer screening. Recommendations will vary depending on your family history and other risks. Hepatitis C blood test. Hepatitis B blood test. Sexually transmitted disease (STD) testing. Diabetes screening. This is done by checking your blood sugar (glucose) after you have not  eaten for a while (fasting). You may have this done every 1-3 years. Abdominal aortic aneurysm (AAA) screening. You may need this if you are a current or former smoker. Osteoporosis. You may be screened starting at age 62 if you are at high risk. Talk with your health care provider about your test results, treatment options, and if necessary, the need for more  tests. Vaccines  Your health care provider may recommend certain vaccines, such as: Influenza vaccine. This is recommended every year. Tetanus, diphtheria, and acellular pertussis (Tdap, Td) vaccine. You may need a Td booster every 10 years. Zoster vaccine. You may need this after age 77. Pneumococcal 13-valent conjugate (PCV13) vaccine. One dose is recommended after age 62. Pneumococcal polysaccharide (PPSV23) vaccine. One dose is recommended after age 49. Talk to your health care provider about which screenings and vaccines you need and how often you need them. This information is not intended to replace advice given to you by your health care provider. Make sure you discuss any questions you have with your health care provider. Document Released: 03/28/2015 Document Revised: 11/19/2015 Document Reviewed: 12/31/2014 Elsevier Interactive Patient Education  2017 [place] Prevention in the Home Falls can cause injuries. They can happen to people of all ages. There are many things you can do to make your home safe and to help prevent falls. What can I do on the outside of my home? Regularly fix the edges of walkways and driveways and fix any cracks. Remove anything that might make you trip as you walk through a door, such as a raised step or threshold. Trim any bushes or trees on the path to your home. Use bright outdoor lighting. Clear any walking paths of anything that might make someone trip, such as rocks or tools. Regularly check to see if handrails are loose or broken. Make sure that both sides of any steps have handrails. Any raised decks and porches should have guardrails on the edges. Have any leaves, snow, or ice cleared regularly. Use sand or salt on walking paths during winter. Clean up any spills in your garage right away. This includes oil or grease spills. What can I do in the bathroom? Use night lights. Install grab bars by the toilet and in the tub and shower.  Do not use towel bars as grab bars. Use non-skid mats or decals in the tub or shower. If you need to sit down in the shower, use a plastic, non-slip stool. Keep the floor dry. Clean up any water that spills on the floor as soon as it happens. Remove soap buildup in the tub or shower regularly. Attach bath mats securely with double-sided non-slip rug tape. Do not have throw rugs and other things on the floor that can make you trip. What can I do in the bedroom? Use night lights. Make sure that you have a light by your bed that is easy to reach. Do not use any sheets or blankets that are too big for your bed. They should not hang down onto the floor. Have a firm chair that has side arms. You can use this for support while you get dressed. Do not have throw rugs and other things on the floor that can make you trip. What can I do in the kitchen? Clean up any spills right away. Avoid walking on wet floors. Keep items that you use a lot in easy-to-reach places. If you need to reach something above you, use a strong step stool that  has a grab bar. Keep electrical cords out of the way. Do not use floor polish or wax that makes floors slippery. If you must use wax, use non-skid floor wax. Do not have throw rugs and other things on the floor that can make you trip. What can I do with my stairs? Do not leave any items on the stairs. Make sure that there are handrails on both sides of the stairs and use them. Fix handrails that are broken or loose. Make sure that handrails are as long as the stairways. Check any carpeting to make sure that it is firmly attached to the stairs. Fix any carpet that is loose or worn. Avoid having throw rugs at the top or bottom of the stairs. If you do have throw rugs, attach them to the floor with carpet tape. Make sure that you have a light switch at the top of the stairs and the bottom of the stairs. If you do not have them, ask someone to add them for you. What else  can I do to help prevent falls? Wear shoes that: Do not have high heels. Have rubber bottoms. Are comfortable and fit you well. Are closed at the toe. Do not wear sandals. If you use a stepladder: Make sure that it is fully opened. Do not climb a closed stepladder. Make sure that both sides of the stepladder are locked into place. Ask someone to hold it for you, if possible. Clearly mark and make sure that you can see: Any grab bars or handrails. First and last steps. Where the edge of each step is. Use tools that help you move around (mobility aids) if they are needed. These include: Canes. Walkers. Scooters. Crutches. Turn on the lights when you go into a dark area. Replace any light bulbs as soon as they burn out. Set up your furniture so you have a clear path. Avoid moving your furniture around. If any of your floors are uneven, fix them. If there are any pets around you, be aware of where they are. Review your medicines with your doctor. Some medicines can make you feel dizzy. This can increase your chance of falling. Ask your doctor what other things that you can do to help prevent falls. This information is not intended to replace advice given to you by your health care provider. Make sure you discuss any questions you have with your health care provider. Document Released: 12/26/2008 Document Revised: 08/07/2015 Document Reviewed: 04/05/2014 Elsevier Interactive Patient Education  2017 [employer].

## 2022-05-18 ENCOUNTER — Ambulatory Visit (INDEPENDENT_AMBULATORY_CARE_PROVIDER_SITE_OTHER): Payer: Medicare HMO

## 2022-05-18 VITALS — Ht 69.5 in | Wt 184.0 lb

## 2022-05-18 DIAGNOSIS — Z0000 Encounter for general adult medical examination without abnormal findings: Secondary | ICD-10-CM

## 2022-08-04 ENCOUNTER — Other Ambulatory Visit: Payer: Self-pay

## 2022-08-04 ENCOUNTER — Other Ambulatory Visit: Payer: Self-pay | Admitting: Internal Medicine

## 2022-08-04 ENCOUNTER — Ambulatory Visit: Payer: Medicare HMO | Admitting: Internal Medicine

## 2022-08-16 ENCOUNTER — Telehealth: Payer: Self-pay | Admitting: Internal Medicine

## 2022-08-16 NOTE — Telephone Encounter (Signed)
Per chart refill was done on 08/04/22 called pt inform information. [facility] will call [facility]../l;mb

## 2022-08-16 NOTE — Telephone Encounter (Signed)
Prescription Request  08/16/2022  LOV: 02/03/2022  What is the name of the medication or equipment? noratryptaline  Have you contacted your pharmacy to request a refill? Yes   Which pharmacy would you like this sent to?  [id number], [place] - [address] [facility] [place] [address] Phone: [phone] Fax: [phone]   Patient notified that their request is being sent to the clinical staff for review and that they should receive a response within 2 business days.   Please advise at Mobile [phone] (mobile)

## 2022-08-27 ENCOUNTER — Other Ambulatory Visit: Payer: Self-pay | Admitting: Acute Care

## 2022-08-27 ENCOUNTER — Encounter: Payer: Self-pay | Admitting: Acute Care

## 2022-08-27 DIAGNOSIS — F17210 Nicotine dependence, cigarettes, uncomplicated: Secondary | ICD-10-CM

## 2022-08-27 DIAGNOSIS — Z122 Encounter for screening for malignant neoplasm of respiratory organs: Secondary | ICD-10-CM

## 2022-08-27 DIAGNOSIS — Z87891 Personal history of nicotine dependence: Secondary | ICD-10-CM

## 2022-08-30 ENCOUNTER — Ambulatory Visit (INDEPENDENT_AMBULATORY_CARE_PROVIDER_SITE_OTHER): Payer: Medicare HMO | Admitting: Internal Medicine

## 2022-08-30 ENCOUNTER — Encounter: Payer: Self-pay | Admitting: Internal Medicine

## 2022-08-30 VITALS — BP 132/80 | HR 85 | Temp 98.0°F | Ht 69.5 in | Wt 186.0 lb

## 2022-08-30 DIAGNOSIS — E785 Hyperlipidemia, unspecified: Secondary | ICD-10-CM

## 2022-08-30 DIAGNOSIS — E1165 Type 2 diabetes mellitus with hyperglycemia: Secondary | ICD-10-CM | POA: Diagnosis not present

## 2022-08-30 DIAGNOSIS — E860 Dehydration: Secondary | ICD-10-CM

## 2022-08-30 DIAGNOSIS — E559 Vitamin D deficiency, unspecified: Secondary | ICD-10-CM

## 2022-08-30 DIAGNOSIS — Z0001 Encounter for general adult medical examination with abnormal findings: Secondary | ICD-10-CM

## 2022-08-30 DIAGNOSIS — E538 Deficiency of other specified B group vitamins: Secondary | ICD-10-CM

## 2022-08-30 DIAGNOSIS — F17200 Nicotine dependence, unspecified, uncomplicated: Secondary | ICD-10-CM

## 2022-08-30 DIAGNOSIS — Z7984 Long term (current) use of oral hypoglycemic drugs: Secondary | ICD-10-CM

## 2022-08-30 DIAGNOSIS — Z125 Encounter for screening for malignant neoplasm of prostate: Secondary | ICD-10-CM | POA: Diagnosis not present

## 2022-08-30 DIAGNOSIS — I10 Essential (primary) hypertension: Secondary | ICD-10-CM | POA: Diagnosis not present

## 2022-08-30 DIAGNOSIS — Z0000 Encounter for general adult medical examination without abnormal findings: Secondary | ICD-10-CM | POA: Diagnosis not present

## 2022-08-30 LAB — URINALYSIS, ROUTINE W REFLEX MICROSCOPIC
Bilirubin Urine: NEGATIVE
Hgb urine dipstick: NEGATIVE
Ketones, ur: NEGATIVE
Leukocytes,Ua: NEGATIVE
Nitrite: NEGATIVE
RBC / HPF: NONE SEEN (ref 0–?)
Specific Gravity, Urine: 1.02 (ref 1.000–1.030)
Total Protein, Urine: NEGATIVE
Urine Glucose: NEGATIVE
Urobilinogen, UA: 0.2 (ref 0.0–1.0)
pH: 6 (ref 5.0–8.0)

## 2022-08-30 LAB — BASIC METABOLIC PANEL
BUN: 18 mg/dL (ref 6–23)
CO2: 31 mEq/L (ref 19–32)
Calcium: 9.8 mg/dL (ref 8.4–10.5)
Chloride: 102 mEq/L (ref 96–112)
Creatinine, Ser: 0.98 mg/dL (ref 0.40–1.50)
GFR: 81.54 mL/min (ref >60.00)
Glucose, Bld: 112 mg/dL — ABNORMAL HIGH (ref 70–99)
Potassium: 4.4 mEq/L (ref 3.5–5.1)
Sodium: 141 mEq/L (ref 135–145)

## 2022-08-30 LAB — PSA: PSA: 1.69 ng/mL (ref 0.10–4.00)

## 2022-08-30 LAB — HEPATIC FUNCTION PANEL
ALT: 18 U/L (ref 0–53)
AST: 12 U/L (ref 0–37)
Albumin: 4.4 g/dL (ref 3.5–5.2)
Alkaline Phosphatase: 61 U/L (ref 39–117)
Bilirubin, Direct: 0.1 mg/dL (ref 0.0–0.3)
Total Bilirubin: 0.4 mg/dL (ref 0.2–1.2)
Total Protein: 6.9 g/dL (ref 6.0–8.3)

## 2022-08-30 LAB — CBC WITH DIFFERENTIAL/PLATELET
Basophils Absolute: 0.1 10*3/uL (ref 0.0–0.1)
Basophils Relative: 0.6 % (ref 0.0–3.0)
Eosinophils Absolute: 0.6 10*3/uL (ref 0.0–0.7)
Eosinophils Relative: 4.6 % (ref 0.0–5.0)
HCT: 47 % (ref 39.0–52.0)
Hemoglobin: 15.5 g/dL (ref 13.0–17.0)
Lymphocytes Relative: 28.4 % (ref 12.0–46.0)
Lymphs Abs: 3.4 10*3/uL (ref 0.7–4.0)
MCHC: 33.1 g/dL (ref 30.0–36.0)
MCV: 95.5 fl (ref 78.0–100.0)
Monocytes Absolute: 0.6 10*3/uL (ref 0.1–1.0)
Monocytes Relative: 5 % (ref 3.0–12.0)
Neutro Abs: 7.3 10*3/uL (ref 1.4–7.7)
Neutrophils Relative %: 61.4 % (ref 43.0–77.0)
Platelets: 263 10*3/uL (ref 150.0–400.0)
RBC: 4.92 Mil/uL (ref 4.22–5.81)
RDW: 12.7 % (ref 11.5–15.5)
WBC: 11.9 10*3/uL — ABNORMAL HIGH (ref 4.0–10.5)

## 2022-08-30 LAB — LIPID PANEL
Cholesterol: 152 mg/dL (ref 0–200)
HDL: 35.3 mg/dL — ABNORMAL LOW (ref >39.00)
NonHDL: 116.72
Total CHOL/HDL Ratio: 4
Triglycerides: 379 mg/dL — ABNORMAL HIGH (ref 0.0–149.0)
VLDL: 75.8 mg/dL — ABNORMAL HIGH (ref 0.0–40.0)

## 2022-08-30 LAB — MICROALBUMIN / CREATININE URINE RATIO
Creatinine,U: 122.2 mg/dL
Microalb Creat Ratio: 0.6 mg/g (ref 0.0–30.0)
Microalb, Ur: 0.7 mg/dL (ref 0.0–1.9)

## 2022-08-30 LAB — HEMOGLOBIN A1C: Hgb A1c MFr Bld: 6.6 % — ABNORMAL HIGH (ref 4.6–6.5)

## 2022-08-30 LAB — VITAMIN D 25 HYDROXY (VIT D DEFICIENCY, FRACTURES): VITD: 16.41 ng/mL — ABNORMAL LOW (ref 30.00–100.00)

## 2022-08-30 LAB — VITAMIN B12: Vitamin B-12: 238 pg/mL (ref 211–911)

## 2022-08-30 LAB — TSH: TSH: 1.36 u[IU]/mL (ref 0.35–5.50)

## 2022-08-30 NOTE — Patient Instructions (Signed)
Ok for work note today and return to work tomorrow  Ok to cancel the June 24 appt here  Please continue all other medications as before, and refills have been done if requested.  Please have the pharmacy call with any other refills you may need.  Please continue your efforts at being more active, low cholesterol diet, and weight control.  You are otherwise up to date with prevention measures today.  Please keep your appointments with your specialists as you may have planned  Please go to the LAB at the blood drawing area for the tests to be done  You will be contacted by phone if any changes need to be made immediately.  Otherwise, you will receive a letter about your results with an explanation, but please check with MyChart first.  Please remember to sign up for MyChart if you have not done so, as this will be important to you in the future with finding out test results, communicating by private email, and scheduling acute appointments online when needed.  Please make an Appointment to return in 6 months, or sooner if needed, also with Lab Appointment for testing done 3-5 days before at the FIRST FLOOR Lab (so this is for TWO appointments - please see the scheduling desk as you leave)

## 2022-08-30 NOTE — Progress Notes (Unsigned)
Patient ID: Ethan Ellis, male   DOB: [birth year], 62 y.o.   MRN: [id number]         Chief Complaint:: wellness exam and Dizziness (Had a episode of dizziness , also needs a note to go back to work)  , smoker, htn, dm, low vit d, dehydration       HPI:  Ethan Ellis is a 62 y.o. male here for wellness exam; plans to call Dr Dione Booze for fu eye appt later this yr.; o/w up to date. Still smoking, not ready to quit.                         Also had an episode of significant dizziness and general weakness and almost fell out last wed at work in the heat, works outside every day and worked harder that day with not enough fluids it seems; Pt denies chest pain, increased sob or doe, wheezing, orthopnea, PND, increased LE swelling, palpitations, or syncope.  Has not been able to get back to work, but has been pushing fluids, overall back to normal stamina today but needs note to get back to work.   Pt denies polydipsia, polyuria, or new focal neuro s/s.   Marland Kitchen Pt denies fever, wt loss, night sweats, loss of appetite, or other constitutional symptoms     Wt Readings from Last 3 Encounters:  08/30/22 186 lb (84.4 kg)  05/18/22 184 lb (83.5 kg)  02/03/22 184 lb (83.5 kg)   BP Readings from Last 3 Encounters:  08/30/22 132/80  02/03/22 120/68  12/16/21 (!) 102/54   Immunization History  Administered Date(s) Administered   Influenza,inj,Quad PF,6+ Mos 02/22/2014, 04/01/2015, 11/23/2016, 11/24/2017, 12/06/2018   Influenza-Unspecified 04/15/2016, 11/23/2021   Pneumococcal Conjugate-13 04/22/2016   Pneumococcal Polysaccharide-23 05/04/2016   RSV,unspecified 11/23/2021   Tdap 09/14/2016   Zoster Recombinat (Shingrix) 06/18/2017, 08/19/2017   Health Maintenance Due  Topic Date Due   OPHTHALMOLOGY EXAM  01/27/2022   Lung Cancer Screening  09/29/2022      Past Medical History:  Diagnosis Date   Depression    Diabetes mellitus without complication (HCC)    type 2   History of kidney stones     passed stone - no surgery   Hyperlipidemia 03/10/2014   Hypertension    LBP (low back pain) 2009   Right   Unspecified viral hepatitis C without hepatic coma 04/04/2015   Past Surgical History:  Procedure Laterality Date   APPENDECTOMY     BACK SURGERY     lower back    COLONOSCOPY  12/06/2014   Dr.Stark   INGUINAL HERNIA REPAIR Left 04/26/2019   Procedure: LEFT INGUINAL HERNIA REPAIR WITH MESH;  Surgeon: Manus Rudd, MD;  Location: [facility];  Service: General;  Laterality: Left;  LMA AND TAP BLOCK   LESION REMOVAL Left 04/26/2019   Procedure: EXCISION OF SKIN LESION LEFT GROIN AND BASE OF PENIS;  Surgeon: Manus Rudd, MD;  Location: [facility];  Service: General;  Laterality: Left;   REVERSE SHOULDER ARTHROPLASTY Left 12/25/2020   Procedure: LEFT REVERSE SHOULDER REPLACEMENT;  Surgeon: Cammy Copa, MD;  Location: [facility] OR;  Service: Orthopedics;  Laterality: Left;   SHOULDER SURGERY     right - x 3 surgery    reports that he has been smoking cigarettes. He has a 23.00 pack-year smoking history. He has never used smokeless tobacco. He reports that he does not currently use drugs. He reports  that he does not drink alcohol. family history includes Diabetes in his father. Allergies  Allergen Reactions   Naproxen Swelling and Other (See Comments)    Mouth swelling- patient can take advil and some other NSAIDs w/o problems, however   Current Outpatient Medications on File Prior to Visit  Medication Sig Dispense Refill   acetaminophen (TYLENOL) 650 MG CR tablet Take 1,950 mg by mouth 2 (two) times daily.     albuterol (VENTOLIN HFA) 108 (90 Base) MCG/ACT inhaler Inhale 2 puffs into the lungs every 6 (six) hours as needed for wheezing or shortness of breath. 54 g 3   aspirin EC 81 MG tablet Take 1 tablet (81 mg total) by mouth daily. 90 tablet 11   Blood Glucose Calibration (ACCU-CHEK GUIDE CONTROL) LIQD Use as directed once daily e11.9 1 each 5    Blood Glucose Monitoring Suppl (ACCU-CHEK GUIDE ME) w/Device KIT Use as directed once daily E11.9 1 kit 0   Cholecalciferol (VITAMIN D-3 PO) Take 1 capsule by mouth daily.     citalopram (CELEXA) 20 MG tablet TAKE 1 TABLET EVERY DAY 90 tablet 2   clotrimazole-betamethasone (LOTRISONE) cream Use as directed to affected area twice daily as needed 15 g 1   glipiZIDE (GLUCOTROL XL) 10 MG 24 hr tablet TAKE 1 TABLET ONE TIME DAILY WITH BREAKFAST 90 tablet 2   glucose blood (ACCU-CHEK GUIDE) test strip Use as instructed once daily E11.9 100 each 12   hydrochlorothiazide (HYDRODIURIL) 25 MG tablet TAKE 1 TABLET EVERY DAY 90 tablet 2   Lancets MISC Use as directed once daily to check blood sugar.  Diagnosis code E11.9 100 each 11   losartan (COZAAR) 100 MG tablet TAKE 1 TABLET EVERY DAY 90 tablet 3   metFORMIN (GLUCOPHAGE) 500 MG tablet TAKE 2 TABLETS EVERY MORNING AND TAKE 1 TABLET EVERY EVENING 270 tablet 2   nortriptyline (PAMELOR) 50 MG capsule TAKE 1 CAPSULE BY MOUTH THREE TIMES DAILY 270 capsule 0   tamsulosin (FLOMAX) 0.4 MG CAPS capsule TAKE 1 CAPSULE EVERY DAY 90 capsule 2   tiZANidine (ZANAFLEX) 4 MG tablet TAKE 3 TABLETS AT BEDTIME 270 tablet 10   No current facility-administered medications on file prior to visit.        ROS:  All others reviewed and negative.  Objective        PE:  BP 132/80 (BP Location: Left Arm, Patient Position: Sitting, Cuff Size: Normal)   Pulse 85   Temp 98 F (36.7 C) (Oral)   Ht 5' 9.5" (1.765 m)   Wt 186 lb (84.4 kg)   SpO2 97%   BMI 27.07 kg/m                 Constitutional: Pt appears in NAD               HENT: Head: NCAT.                Right Ear: External ear normal.                 Left Ear: External ear normal.                Eyes: . Pupils are equal, round, and reactive to light. Conjunctivae and EOM are normal               Nose: without d/c or deformity               Neck: Neck supple. Gross normal ROM  Cardiovascular:  Normal rate and regular rhythm.                 Pulmonary/Chest: Effort normal and breath sounds without rales or wheezing.                Abd:  Soft, NT, ND, + BS, no organomegaly               Neurological: Pt is alert. At baseline orientation, motor grossly intact               Skin: Skin is warm. No rashes, no other new lesions, LE edema - none               Psychiatric: Pt behavior is normal without agitation   Micro: none  Cardiac tracings I have personally interpreted today:  none  Pertinent Radiological findings (summarize): none   Lab Results  Component Value Date   WBC 11.9 (H) 08/30/2022   HGB 15.5 08/30/2022   HCT 47.0 08/30/2022   PLT 263.0 08/30/2022   GLUCOSE 112 (H) 08/30/2022   CHOL 152 08/30/2022   TRIG 379.0 (H) 08/30/2022   HDL 35.30 (L) 08/30/2022   LDLDIRECT 82.0 08/30/2022   LDLCALC 72 02/03/2022   ALT 18 08/30/2022   AST 12 08/30/2022   NA 141 08/30/2022   K 4.4 08/30/2022   CL 102 08/30/2022   CREATININE 0.98 08/30/2022   BUN 18 08/30/2022   CO2 31 08/30/2022   TSH 1.36 08/30/2022   PSA 1.69 08/30/2022   HGBA1C 6.6 (H) 08/30/2022   MICROALBUR 0.7 08/30/2022   Assessment/Plan:  Ethan Ellis is a 62 y.o. White or Caucasian [1] male with  has a past medical history of Depression, Diabetes mellitus without complication (HCC), History of kidney stones, Hyperlipidemia (03/10/2014), Hypertension, LBP (low back pain) (2009), and Unspecified viral hepatitis C without hepatic coma (04/04/2015).  Encounter for well adult exam with abnormal findings Age and sex appropriate education and counseling updated with regular exercise and diet Referrals for preventative services - pt to call for eye exam soon Immunizations addressed - none needed Smoking counseling  - counseled to quit, pt not ready Evidence for depression or other mood disorder - none significant Most recent labs reviewed. I have personally reviewed and have noted: 1) the patient's medical and  social history 2) The patient's current medications and supplements 3) The patient's height, weight, and BMI have been recorded in the chart   Tobacco dependence Pt counsled to quit, pt not ready  Uncontrolled hypertension BP Readings from Last 3 Encounters:  08/30/22 132/80  02/03/22 120/68  12/16/21 (!) 102/54   Stable, pt to continue medical treatment hct 25 every day, losartan 100 qd   Diabetes (HCC) Lab Results  Component Value Date   HGBA1C 6.6 (H) 08/30/2022   Stable, pt to continue current medical treatment glucotol xl 10 every day, metformin 500 mg - 2 am, 1 pm   Hyperlipidemia Lab Results  Component Value Date   LDLCALC 72 02/03/2022   Uncontrolled, goal < 70, pt for increased statin lipitor 20 every day  Vitamin D deficiency Last vitamin D Lab Results  Component Value Date   VD25OH 16.41 (L) 08/30/2022   Low, to start oral replacement  Dehydration Exam benign, symptoms resolved, for lab today, ok for note to return to work  Followup: Return in about 6 months (around 03/01/2023).  Oliver Barre, MD 09/01/2022 8:11 PM [facility] [place]  [facility]

## 2022-08-31 ENCOUNTER — Encounter: Payer: Self-pay | Admitting: Internal Medicine

## 2022-08-31 ENCOUNTER — Other Ambulatory Visit: Payer: Self-pay | Admitting: Internal Medicine

## 2022-08-31 LAB — LDL CHOLESTEROL, DIRECT: Direct LDL: 82 mg/dL

## 2022-08-31 MED ORDER — ATORVASTATIN CALCIUM 20 MG PO TABS: 20.0000 mg | ORAL_TABLET | Freq: Every day | ORAL | 3 refills | Status: DC

## 2022-09-01 ENCOUNTER — Encounter: Payer: Self-pay | Admitting: Internal Medicine

## 2022-09-01 NOTE — Assessment & Plan Note (Signed)
Pt counsled to quit, pt not ready °

## 2022-09-01 NOTE — Assessment & Plan Note (Signed)
Last vitamin D Lab Results  Component Value Date   VD25OH 16.41 (L) 08/30/2022   Low, to start oral replacement

## 2022-09-01 NOTE — Assessment & Plan Note (Signed)
Age and sex appropriate education and counseling updated with regular exercise and diet Referrals for preventative services - pt to call for eye exam soon Immunizations addressed - none needed Smoking counseling  - counseled to quit, pt not ready Evidence for depression or other mood disorder - none significant Most recent labs reviewed. I have personally reviewed and have noted: 1) the patient's medical and social history 2) The patient's current medications and supplements 3) The patient's height, weight, and BMI have been recorded in the chart

## 2022-09-01 NOTE — Assessment & Plan Note (Signed)
BP Readings from Last 3 Encounters:  08/30/22 132/80  02/03/22 120/68  12/16/21 (!) 102/54   Stable, pt to continue medical treatment hct 25 every day, losartan 100 qd

## 2022-09-01 NOTE — Assessment & Plan Note (Signed)
Exam benign, symptoms resolved, for lab today, ok for note to return to work

## 2022-09-01 NOTE — Assessment & Plan Note (Addendum)
Lab Results  Component Value Date   LDLCALC 72 02/03/2022   Uncontrolled, goal < 70, pt for increased statin lipitor 20 every day

## 2022-09-01 NOTE — Assessment & Plan Note (Signed)
Lab Results  Component Value Date   HGBA1C 6.6 (H) 08/30/2022   Stable, pt to continue current medical treatment glucotol xl 10 every day, metformin 500 mg - 2 am, 1 pm

## 2022-09-06 ENCOUNTER — Ambulatory Visit: Payer: Medicare HMO | Admitting: Internal Medicine

## 2022-10-04 ENCOUNTER — Other Ambulatory Visit: Payer: Medicare HMO

## 2022-10-07 ENCOUNTER — Inpatient Hospital Stay: Admission: RE | Admit: 2022-10-07 | Payer: Medicare HMO | Source: Ambulatory Visit

## 2022-11-15 ENCOUNTER — Other Ambulatory Visit: Payer: Self-pay | Admitting: Internal Medicine

## 2022-11-16 ENCOUNTER — Other Ambulatory Visit: Payer: Self-pay

## 2022-12-16 ENCOUNTER — Other Ambulatory Visit: Payer: Self-pay | Admitting: Internal Medicine

## 2022-12-16 ENCOUNTER — Other Ambulatory Visit: Payer: Self-pay

## 2023-01-09 ENCOUNTER — Other Ambulatory Visit: Payer: Self-pay | Admitting: Internal Medicine

## 2023-01-25 LAB — HM DIABETES EYE EXAM

## 2023-02-03 ENCOUNTER — Telehealth: Payer: Self-pay

## 2023-02-03 NOTE — Patient Outreach (Signed)
Attempted to contact patient regarding care gaps. Left voicemail for patient to return my call at [phone].  Nicholes Rough, CMA Care Guide [employer]

## 2023-03-01 ENCOUNTER — Ambulatory Visit: Payer: Medicare HMO | Admitting: Internal Medicine

## 2023-03-03 ENCOUNTER — Ambulatory Visit: Payer: Medicare HMO | Admitting: Internal Medicine

## 2023-03-07 ENCOUNTER — Ambulatory Visit (INDEPENDENT_AMBULATORY_CARE_PROVIDER_SITE_OTHER): Payer: Medicare HMO | Admitting: Internal Medicine

## 2023-03-07 VITALS — HR 83 | Temp 98.5°F | Ht 69.5 in | Wt 193.2 lb

## 2023-03-07 DIAGNOSIS — Z7984 Long term (current) use of oral hypoglycemic drugs: Secondary | ICD-10-CM | POA: Diagnosis not present

## 2023-03-07 DIAGNOSIS — E559 Vitamin D deficiency, unspecified: Secondary | ICD-10-CM | POA: Diagnosis not present

## 2023-03-07 DIAGNOSIS — F17200 Nicotine dependence, unspecified, uncomplicated: Secondary | ICD-10-CM

## 2023-03-07 DIAGNOSIS — J449 Chronic obstructive pulmonary disease, unspecified: Secondary | ICD-10-CM | POA: Diagnosis not present

## 2023-03-07 DIAGNOSIS — E785 Hyperlipidemia, unspecified: Secondary | ICD-10-CM | POA: Diagnosis not present

## 2023-03-07 DIAGNOSIS — E1165 Type 2 diabetes mellitus with hyperglycemia: Secondary | ICD-10-CM | POA: Diagnosis not present

## 2023-03-07 DIAGNOSIS — I10 Essential (primary) hypertension: Secondary | ICD-10-CM

## 2023-03-07 LAB — HEPATIC FUNCTION PANEL
ALT: 19 U/L (ref 0–53)
AST: 13 U/L (ref 0–37)
Albumin: 4.5 g/dL (ref 3.5–5.2)
Alkaline Phosphatase: 75 U/L (ref 39–117)
Bilirubin, Direct: 0.1 mg/dL (ref 0.0–0.3)
Total Bilirubin: 0.6 mg/dL (ref 0.2–1.2)
Total Protein: 7.1 g/dL (ref 6.0–8.3)

## 2023-03-07 LAB — BASIC METABOLIC PANEL
BUN: 13 mg/dL (ref 6–23)
CO2: 31 meq/L (ref 19–32)
Calcium: 9.5 mg/dL (ref 8.4–10.5)
Chloride: 101 meq/L (ref 96–112)
Creatinine, Ser: 0.81 mg/dL (ref 0.40–1.50)
GFR: 92.89 mL/min (ref >60.00)
Glucose, Bld: 83 mg/dL (ref 70–99)
Potassium: 4.1 meq/L (ref 3.5–5.1)
Sodium: 140 meq/L (ref 135–145)

## 2023-03-07 LAB — LIPID PANEL
Cholesterol: 146 mg/dL (ref 0–200)
HDL: 35.5 mg/dL — ABNORMAL LOW (ref >39.00)
LDL Cholesterol: 50 mg/dL (ref 0–99)
NonHDL: 110.22
Total CHOL/HDL Ratio: 4
Triglycerides: 300 mg/dL — ABNORMAL HIGH (ref 0.0–149.0)
VLDL: 60 mg/dL — ABNORMAL HIGH (ref 0.0–40.0)

## 2023-03-07 MED ORDER — TRELEGY ELLIPTA 100-62.5-25 MCG/ACT IN AEPB: 1.0000 | INHALATION_SPRAY | Freq: Every day | RESPIRATORY_TRACT | 3 refills | Status: DC

## 2023-03-07 MED ORDER — ATORVASTATIN CALCIUM 20 MG PO TABS: 20.0000 mg | ORAL_TABLET | Freq: Every day | ORAL | 3 refills | Status: DC

## 2023-03-07 NOTE — Patient Instructions (Addendum)
Please remember to call pulmonary for the Low Dose CT scan for the lungs for cancer screening  Please call if you would want the Cardiac CT score testing done  Please take all new medication as prescribed - the trelegy inhaler for breathing  Ok to increase the Lipitor to 20 mg per day  Please continue all other medications as before, and refills have been done if requested.  Please have the pharmacy call with any other refills you may need.  Please continue your efforts at being more active, low cholesterol diet, and weight control  Please keep your appointments with your specialists as you may have planned  Please go to the LAB at the blood drawing area for the tests to be done  You will be contacted by phone if any changes need to be made immediately.  Otherwise, you will receive a letter about your results with an explanation, but please check with MyChart first.  Please make an Appointment to return in 6 months, or sooner if needed

## 2023-03-07 NOTE — Progress Notes (Unsigned)
Patient ID: Ethan Ellis, male   DOB: 14-Feb-1961, 62 y.o.   MRN: [id number]        Chief Complaint: follow up COPD, dm, smoker, hld       HPI:  Ethan Ellis is a 62 y.o. male Pt denies chest pain, orthopnea, PND, increased LE swelling, palpitations, dizziness or syncope, but with mild worsening sob doe occasoinal wheezing.   Pt denies polydipsia, polyuria, or new focal neuro s/s.    Pt denies fever, wt loss, night sweats, loss of appetite, or other constitutional symptoms  Due to see Dr Dione Booze eye doc after jan 1.   Still smoking, not ready to quit Wt Readings from Last 3 Encounters:  03/07/23 193 lb 4 oz (87.7 kg)  08/30/22 186 lb (84.4 kg)  05/18/22 184 lb (83.5 kg)   BP Readings from Last 3 Encounters:  08/30/22 132/80  02/03/22 120/68  12/16/21 (!) 102/54         Past Medical History:  Diagnosis Date   Depression    Diabetes mellitus without complication (HCC)    type 2   History of kidney stones    passed stone - no surgery   Hyperlipidemia 03/10/2014   Hypertension    LBP (low back pain) 2009   Right   Unspecified viral hepatitis C without hepatic coma 04/04/2015   Past Surgical History:  Procedure Laterality Date   APPENDECTOMY     BACK SURGERY     lower back    COLONOSCOPY  12/06/2014   Dr.Stark   INGUINAL HERNIA REPAIR Left 04/26/2019   Procedure: LEFT INGUINAL HERNIA REPAIR WITH MESH;  Surgeon: Manus Rudd, MD;  Location: [facility];  Service: General;  Laterality: Left;  LMA AND TAP BLOCK   LESION REMOVAL Left 04/26/2019   Procedure: EXCISION OF SKIN LESION LEFT GROIN AND BASE OF PENIS;  Surgeon: Manus Rudd, MD;  Location: [facility];  Service: General;  Laterality: Left;   REVERSE SHOULDER ARTHROPLASTY Left 12/25/2020   Procedure: LEFT REVERSE SHOULDER REPLACEMENT;  Surgeon: Cammy Copa, MD;  Location: [facility];  Service: Orthopedics;  Laterality: Left;   SHOULDER SURGERY     right - x 3 surgery    reports that he  has been smoking cigarettes. He has a 23 pack-year smoking history. He has never used smokeless tobacco. He reports that he does not currently use drugs. He reports that he does not drink alcohol. family history includes Diabetes in his father. Allergies  Allergen Reactions   Naproxen Swelling and Other (See Comments)    Mouth swelling- patient can take advil and some other NSAIDs w/o problems, however   Current Outpatient Medications on File Prior to Visit  Medication Sig Dispense Refill   acetaminophen (TYLENOL) 650 MG CR tablet Take 1,950 mg by mouth 2 (two) times daily.     albuterol (VENTOLIN HFA) 108 (90 Base) MCG/ACT inhaler Inhale 2 puffs into the lungs every 6 (six) hours as needed for wheezing or shortness of breath. 54 g 3   aspirin EC 81 MG tablet Take 1 tablet (81 mg total) by mouth daily. 90 tablet 11   Blood Glucose Calibration (ACCU-CHEK GUIDE CONTROL) LIQD Use as directed once daily e11.9 1 each 5   Blood Glucose Monitoring Suppl (ACCU-CHEK GUIDE ME) w/Device KIT Use as directed once daily E11.9 1 kit 0   Cholecalciferol (VITAMIN D-3 PO) Take 1 capsule by mouth daily.     citalopram (CELEXA) 20 MG tablet  TAKE 1 TABLET EVERY DAY 90 tablet 3   clotrimazole-betamethasone (LOTRISONE) cream Use as directed to affected area twice daily as needed 15 g 1   glipiZIDE (GLUCOTROL XL) 10 MG 24 hr tablet TAKE 1 TABLET ONE TIME DAILY WITH BREAKFAST 90 tablet 3   glucose blood (ACCU-CHEK GUIDE) test strip Use as instructed once daily E11.9 100 each 12   hydrochlorothiazide (HYDRODIURIL) 25 MG tablet TAKE 1 TABLET EVERY DAY 90 tablet 3   Lancets MISC Use as directed once daily to check blood sugar.  Diagnosis code E11.9 100 each 11   losartan (COZAAR) 100 MG tablet TAKE 1 TABLET EVERY DAY 90 tablet 3   metFORMIN (GLUCOPHAGE) 500 MG tablet TAKE 2 TABLETS EVERY MORNING AND TAKE 1 TABLET EVERY EVENING 270 tablet 3   nortriptyline (PAMELOR) 50 MG capsule TAKE 1 CAPSULE BY MOUTH THREE TIMES DAILY  270 capsule 3   tamsulosin (FLOMAX) 0.4 MG CAPS capsule TAKE 1 CAPSULE EVERY DAY 90 capsule 3   tiZANidine (ZANAFLEX) 4 MG tablet TAKE 3 TABLETS AT BEDTIME 270 tablet 10   No current facility-administered medications on file prior to visit.        ROS:  All others reviewed and negative.  Objective        PE:  Pulse 83   Temp 98.5 F (36.9 C) (Temporal)   Ht 5' 9.5" (1.765 m)   Wt 193 lb 4 oz (87.7 kg)   SpO2 94%   BMI 28.13 kg/m                 Constitutional: Pt appears in NAD               HENT: Head: NCAT.                Right Ear: External ear normal.                 Left Ear: External ear normal.                Eyes: . Pupils are equal, round, and reactive to light. Conjunctivae and EOM are normal               Nose: without d/c or deformity               Neck: Neck supple. Gross normal ROM               Cardiovascular: Normal rate and regular rhythm.                 Pulmonary/Chest: Effort normal and breath sounds without rales or wheezing.                Abd:  Soft, NT, ND, + BS, no organomegaly               Neurological: Pt is alert. At baseline orientation, motor grossly intact               Skin: Skin is warm. No rashes, no other new lesions, LE edema - none               Psychiatric: Pt behavior is normal without agitation   Micro: none  Cardiac tracings I have personally interpreted today:  none  Pertinent Radiological findings (summarize): none   Lab Results  Component Value Date   WBC 11.9 (H) 08/30/2022   HGB 15.5 08/30/2022   HCT 47.0 08/30/2022   PLT 263.0 08/30/2022   GLUCOSE 83  03/07/2023   CHOL 146 03/07/2023   TRIG 300.0 (H) 03/07/2023   HDL 35.50 (L) 03/07/2023   LDLDIRECT 82.0 08/30/2022   LDLCALC 50 03/07/2023   ALT 19 03/07/2023   AST 13 03/07/2023   NA 140 03/07/2023   K 4.1 03/07/2023   CL 101 03/07/2023   CREATININE 0.81 03/07/2023   BUN 13 03/07/2023   CO2 31 03/07/2023   TSH 1.36 08/30/2022   PSA 1.69 08/30/2022   HGBA1C 7.0  (H) 03/07/2023   MICROALBUR 0.7 08/30/2022   Assessment/Plan:  Ethan Ellis is a 62 y.o. White or Caucasian [1] male with  has a past medical history of Depression, Diabetes mellitus without complication (HCC), History of kidney stones, Hyperlipidemia (03/10/2014), Hypertension, LBP (low back pain) (2009), and Unspecified viral hepatitis C without hepatic coma (04/04/2015).  Tobacco dependence Pt counsled to quit, pt not ready  Uncontrolled hypertension BP Readings from Last 3 Encounters:  08/30/22 132/80  02/03/22 120/68  12/16/21 (!) 102/54   Stable, pt to continue medical treatment hct 25 every day, losartan 100 qd   Diabetes (HCC) Lab Results  Component Value Date   HGBA1C 7.0 (H) 03/07/2023   Stable, pt to continue current medical treatment glucotrol xl 10 every day, metformin 100 mg qam, 500 mg qpm,    Hyperlipidemia  uncontrolled, pt to increase lipitor 20 mg d   Vitamin D deficiency Last vitamin D Lab Results  Component Value Date   VD25OH 16.41 (L) 08/30/2022   Low, to start oral replacement   COPD (chronic obstructive pulmonary disease) (HCC) Uncontrolled, for start trelegy 1 qd  Followup: Return in about 6 months (around 09/05/2023).  Oliver Barre, MD 03/10/2023 7:55 PM [facility]

## 2023-03-08 ENCOUNTER — Encounter: Payer: Self-pay | Admitting: Internal Medicine

## 2023-03-08 LAB — HEMOGLOBIN A1C: Hgb A1c MFr Bld: 7 % — ABNORMAL HIGH (ref 4.6–6.5)

## 2023-03-10 ENCOUNTER — Encounter: Payer: Self-pay | Admitting: Internal Medicine

## 2023-03-10 DIAGNOSIS — J449 Chronic obstructive pulmonary disease, unspecified: Secondary | ICD-10-CM | POA: Insufficient documentation

## 2023-03-10 NOTE — Assessment & Plan Note (Signed)
BP Readings from Last 3 Encounters:  08/30/22 132/80  02/03/22 120/68  12/16/21 (!) 102/54   Stable, pt to continue medical treatment hct 25 every day, losartan 100 qd

## 2023-03-10 NOTE — Assessment & Plan Note (Signed)
Pt counsled to quit, pt not ready °

## 2023-03-10 NOTE — Assessment & Plan Note (Signed)
Lab Results  Component Value Date   HGBA1C 7.0 (H) 03/07/2023   Stable, pt to continue current medical treatment glucotrol xl 10 every day, metformin 100 mg qam, 500 mg qpm,

## 2023-03-10 NOTE — Assessment & Plan Note (Signed)
Uncontrolled, for start trelegy 1 qd

## 2023-03-10 NOTE — Assessment & Plan Note (Signed)
Last vitamin D Lab Results  Component Value Date   VD25OH 16.41 (L) 08/30/2022   Low, to start oral replacement

## 2023-03-10 NOTE — Assessment & Plan Note (Signed)
  uncontrolled, pt to increase lipitor 20 mg d

## 2023-04-01 ENCOUNTER — Ambulatory Visit
Admission: RE | Admit: 2023-04-01 | Discharge: 2023-04-01 | Disposition: A | Discharge status: Home / Self Care | Payer: Medicare HMO | Source: Ambulatory Visit | Attending: Acute Care | Admitting: Acute Care

## 2023-04-01 DIAGNOSIS — Z122 Encounter for screening for malignant neoplasm of respiratory organs: Secondary | ICD-10-CM | POA: Diagnosis not present

## 2023-04-01 DIAGNOSIS — Z136 Encounter for screening for cardiovascular disorders: Secondary | ICD-10-CM | POA: Diagnosis not present

## 2023-04-01 DIAGNOSIS — F17210 Nicotine dependence, cigarettes, uncomplicated: Secondary | ICD-10-CM

## 2023-04-01 DIAGNOSIS — Z87891 Personal history of nicotine dependence: Secondary | ICD-10-CM

## 2023-04-06 ENCOUNTER — Other Ambulatory Visit: Payer: Self-pay | Admitting: Acute Care

## 2023-04-06 DIAGNOSIS — Z87891 Personal history of nicotine dependence: Secondary | ICD-10-CM

## 2023-04-06 DIAGNOSIS — Z122 Encounter for screening for malignant neoplasm of respiratory organs: Secondary | ICD-10-CM

## 2023-04-11 ENCOUNTER — Other Ambulatory Visit: Payer: Self-pay | Admitting: Internal Medicine

## 2023-04-11 ENCOUNTER — Other Ambulatory Visit: Payer: Self-pay

## 2023-04-11 MED ORDER — LOSARTAN POTASSIUM 100 MG PO TABS: 100.0000 mg | ORAL_TABLET | Freq: Every day | ORAL | 3 refills | Status: DC

## 2023-04-11 NOTE — Telephone Encounter (Signed)
Copied from CRM [id number]. Topic: Clinical - Medication Refill >> Apr 11, 2023  8:20 AM Maxwell Marion wrote: Most Recent Primary Care Visit:  Provider: Corwin Levins  Department: [facility]  Visit Type: OFFICE VISIT  Date: 03/07/2023  Medication: losartan (COZAAR) 100 MG tablet  Has the patient contacted their pharmacy? Yes (Agent: If no, request that the patient contact the pharmacy for the refill. If patient does not wish to contact the pharmacy document the reason why and proceed with request.) (Agent: If yes, when and what did the pharmacy advise?) pharmacy said they were sending over request and advised patient to contact his doctor   Is this the correct pharmacy for this prescription? Yes If no, delete pharmacy and type the correct one.  This is the patient's preferred pharmacy:   [facility] - [place], [place] - [address] [place] [address] Phone: [phone] Fax: [phone]   Has the prescription been filled recently? Yes  Is the patient out of the medication? Yes  Has the patient been seen for an appointment in the last year OR does the patient have an upcoming appointment? Yes  Can we respond through MyChart? No  Agent: Please be advised that Rx refills may take up to 3 business days. We ask that you follow-up with your pharmacy.

## 2023-05-19 ENCOUNTER — Ambulatory Visit: Payer: Medicare HMO

## 2023-05-19 VITALS — Ht 69.5 in | Wt 193.0 lb

## 2023-05-19 DIAGNOSIS — Z0000 Encounter for general adult medical examination without abnormal findings: Secondary | ICD-10-CM

## 2023-05-19 NOTE — Progress Notes (Signed)
 Subjective:   Ethan Ellis is a 63 y.o. who presents for a Medicare Wellness preventive visit.  Visit Complete: Virtual I connected with  Clide Deutscher on 05/19/23 by a audio enabled telemedicine application and verified that I am speaking with the correct person using two identifiers.  Patient Location: Home  Provider Location: Office/Clinic  I discussed the limitations of evaluation and management by telemedicine. The patient expressed understanding and agreed to proceed.  Vital Signs: Because this visit was a virtual/telehealth visit, some criteria may be missing or patient reported. Any vitals not documented were not able to be obtained and vitals that have been documented are patient reported.  VideoDeclined- This patient declined Librarian, academic. Therefore the visit was completed with audio only.  AWV Questionnaire: No: Patient Medicare AWV questionnaire was not completed prior to this visit.  Cardiac Risk Factors include: advanced age (>56men, >35 women);diabetes mellitus;hypertension;dyslipidemia;smoking/ tobacco exposure;male gender     Objective:    Today's Vitals   05/19/23 1406  Weight: 193 lb (87.5 kg)  Height: 5' 9.5" (1.765 m)   Body mass index is 28.09 kg/m.     05/19/2023    2:04 PM 05/18/2022   11:13 AM 12/23/2020   10:17 AM 04/26/2019    7:59 AM 04/19/2019   12:10 PM 03/20/2019    8:48 AM 11/22/2014   11:23 AM  Advanced Directives  Does Patient Have a Medical Advance Directive? No No No No No No No  Would patient like information on creating a medical advance directive? Yes (MAU/Ambulatory/Procedural Areas - Information given) Yes (MAU/Ambulatory/Procedural Areas - Information given) No - Patient declined No - Patient declined  No - Patient declined     Current Medications (verified) Outpatient Encounter Medications as of 05/19/2023  Medication Sig   acetaminophen (TYLENOL) 650 MG CR tablet Take 1,950 mg by mouth 2 (two) times  daily.   albuterol (VENTOLIN HFA) 108 (90 Base) MCG/ACT inhaler Inhale 2 puffs into the lungs every 6 (six) hours as needed for wheezing or shortness of breath.   aspirin EC 81 MG tablet Take 1 tablet (81 mg total) by mouth daily.   atorvastatin (LIPITOR) 20 MG tablet Take 1 tablet (20 mg total) by mouth daily.   Blood Glucose Calibration (ACCU-CHEK GUIDE CONTROL) LIQD Use as directed once daily e11.9   Blood Glucose Monitoring Suppl (ACCU-CHEK GUIDE ME) w/Device KIT Use as directed once daily E11.9   Cholecalciferol (VITAMIN D-3 PO) Take 1 capsule by mouth daily.   citalopram (CELEXA) 20 MG tablet TAKE 1 TABLET EVERY DAY   clotrimazole-betamethasone (LOTRISONE) cream Use as directed to affected area twice daily as needed   Fluticasone-Umeclidin-Vilant (TRELEGY ELLIPTA) 100-62.5-25 MCG/ACT AEPB Inhale 1 puff into the lungs daily.   glipiZIDE (GLUCOTROL XL) 10 MG 24 hr tablet TAKE 1 TABLET ONE TIME DAILY WITH BREAKFAST   glucose blood (ACCU-CHEK GUIDE) test strip Use as instructed once daily E11.9   hydrochlorothiazide (HYDRODIURIL) 25 MG tablet TAKE 1 TABLET EVERY DAY   Lancets MISC Use as directed once daily to check blood sugar.  Diagnosis code E11.9   losartan (COZAAR) 100 MG tablet Take 1 tablet (100 mg total) by mouth daily.   losartan (COZAAR) 100 MG tablet TAKE 1 TABLET EVERY DAY   metFORMIN (GLUCOPHAGE) 500 MG tablet TAKE 2 TABLETS EVERY MORNING AND TAKE 1 TABLET EVERY EVENING   nortriptyline (PAMELOR) 50 MG capsule TAKE 1 CAPSULE BY MOUTH THREE TIMES DAILY   tamsulosin (FLOMAX) 0.4 MG  CAPS capsule TAKE 1 CAPSULE EVERY DAY   tiZANidine (ZANAFLEX) 4 MG tablet TAKE 3 TABLETS AT BEDTIME   No facility-administered encounter medications on file as of 05/19/2023.    Allergies (verified) Naproxen   History: Past Medical History:  Diagnosis Date   Depression    Diabetes mellitus without complication (HCC)    type 2   History of kidney stones    passed stone - no surgery    Hyperlipidemia 03/10/2014   Hypertension    LBP (low back pain) 2009   Right   Unspecified viral hepatitis C without hepatic coma 04/04/2015   Past Surgical History:  Procedure Laterality Date   APPENDECTOMY     BACK SURGERY     lower back    COLONOSCOPY  12/06/2014   Dr.Stark   INGUINAL HERNIA REPAIR Left 04/26/2019   Procedure: LEFT INGUINAL HERNIA REPAIR WITH MESH;  Surgeon: Manus Rudd, MD;  Location: [facility];  Service: General;  Laterality: Left;  LMA AND TAP BLOCK   LESION REMOVAL Left 04/26/2019   Procedure: EXCISION OF SKIN LESION LEFT GROIN AND BASE OF PENIS;  Surgeon: Manus Rudd, MD;  Location:  SURGERY CENTER;  Service: General;  Laterality: Left;   REVERSE SHOULDER ARTHROPLASTY Left 12/25/2020   Procedure: LEFT REVERSE SHOULDER REPLACEMENT;  Surgeon: Cammy Copa, MD;  Location: [facility];  Service: Orthopedics;  Laterality: Left;   SHOULDER SURGERY     right - x 3 surgery   Family History  Problem Relation Age of Onset   Diabetes Father    Colon cancer Neg Hx    Colon polyps Neg Hx    Rectal cancer Neg Hx    Stomach cancer Neg Hx    Social History   Socioeconomic History   Marital status: Divorced    Spouse name: Not on file   Number of children: Not on file   Years of education: Not on file   Highest education level: Not on file  Occupational History   Not on file  Tobacco Use   Smoking status: Every Day    Current packs/day: 0.50    Average packs/day: 0.5 packs/day for 46.0 years (23.0 ttl pk-yrs)    Types: Cigarettes    Passive exposure: Current   Smokeless tobacco: Never  Vaping Use   Vaping status: Never Used  Substance and Sexual Activity   Alcohol use: No    Alcohol/week: 0.0 standard drinks of alcohol   Drug use: Not Currently   Sexual activity: Yes  Other Topics Concern   Not on file  Social History Narrative   Job w/Trucking company; 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 Exercise -  NO   Divorced   Social  Drivers of Corporate investment banker Strain: Low Risk  (05/19/2023)   Overall Financial Resource Strain (CARDIA)    Difficulty of Paying Living Expenses: Not hard at all  Food Insecurity: No Food Insecurity (05/19/2023)   Hunger Vital Sign    Worried About Running Out of Food in the Last Year: Never true    Ran Out of Food in the Last Year: Never true  Transportation Needs: No Transportation Needs (05/19/2023)   PRAPARE - Administrator, Civil Service (Medical): No    Lack of Transportation (Non-Medical): No  Physical Activity: Insufficiently Active (05/19/2023)   Exercise Vital Sign    Days of Exercise per Week: 1 day    Minutes of Exercise per Session: 30 min  Stress: No Stress Concern  Present (05/19/2023)   [employer] of Occupational Health - Occupational Stress Questionnaire    Feeling of Stress : Not at all  Social Connections: Moderately Integrated (05/19/2023)   Social Connection and Isolation Panel [NHANES]    Frequency of Communication with Friends and Family: More than three times a week    Frequency of Social Gatherings with Friends and Family: More than three times a week    Attends Religious Services: More than 4 times per year    Active Member of [employer] or Organizations: Yes    Attends Banker Meetings: Never    Marital Status: Divorced    Tobacco Counseling - Current Smoker Ready to quit: No Counseling given: Yes Lung Cancer Screening - Completed 03/2023.  Follow-up screening scheduled for 03/31/2024.   Clinical Intake:  Pre-visit preparation completed: Yes  Pain : No/denies pain     BMI - recorded: 28.09 Nutritional Status: BMI 25 -29 Overweight Nutritional Risks: None Diabetes: Yes CBG done?: No Did pt. bring in CBG monitor from home?: No  How often do you need to have someone help you when you read instructions, pamphlets, or other written materials from your doctor or pharmacy?: 1 - Never  Interpreter Needed?: No  Information  entered by :: Hassell Halim, CMA   Activities of Daily Living     05/19/2023    2:08 PM  In your present state of health, do you have any difficulty performing the following activities:  Hearing? 0  Vision? 0  Difficulty concentrating or making decisions? 0  Walking or climbing stairs? 0  Dressing or bathing? 0  Doing errands, shopping? 0  Preparing Food and eating ? N  Using the Toilet? N  In the past six months, have you accidently leaked urine? N  Do you have problems with loss of bowel control? N  Managing your Medications? N  Managing your Finances? N  Housekeeping or managing your Housekeeping? N    Patient Care Team: Corwin Levins, MD as PCP - General (Internal Medicine) [facility] Bjorn Pippin, MD as Attending Physician (Urology) Betha Loa, MD as Consulting Physician (Orthopedic Surgery)  Indicate any recent Medical Services you may have received from other than [facility] providers in the past year (date may be approximate).     Assessment:   This is a routine wellness examination for Ethan Ellis.  Hearing/Vision screen Hearing Screening - Comments:: Denies hearing difficulties   Vision Screening - Comments:: Wears rx glasses - up to date with routine eye exams with [facility]   Goals Addressed               This Visit's Progress     Patient Stated (pt-stated)        Patient stated he wants to stop taking diabetes medication and plans to continue watching diet/exercise.       Depression Screen     05/19/2023    2:11 PM 08/30/2022    2:57 PM 05/18/2022   11:12 AM 02/03/2022    3:28 PM 09/08/2021    1:37 PM 07/14/2020    3:42 PM 01/07/2020    4:43 PM  PHQ 2/9 Scores  PHQ - 2 Score 0 0 0  0 0 3  PHQ- 9 Score 0    2  7  Exception Documentation    Patient refusal       Fall Risk     05/19/2023    2:12 PM 08/30/2022    2:57 PM 05/18/2022  11:11 AM 02/03/2022    3:28 PM 09/08/2021    1:37 PM  Fall Risk   Falls in the past year? 0 0 0 0 0   Number falls in past yr: 0 0 0  0  Injury with Fall? 0 0 0  0  Risk for fall due to : No Fall Risks No Fall Risks No Fall Risks No Fall Risks   Follow up Falls prevention discussed;Falls evaluation completed Falls evaluation completed Falls prevention discussed;Education provided;Falls evaluation completed Falls evaluation completed     MEDICARE RISK AT HOME:  Medicare Risk at Home Any stairs in or around the home?: Yes (outside) If so, are there any without handrails?: No Home free of loose throw rugs in walkways, pet beds, electrical cords, etc?: Yes Adequate lighting in your home to reduce risk of falls?: Yes Life alert?: No Use of a cane, walker or w/c?: No Grab bars in the bathroom?: No Shower chair or bench in shower?: No Elevated toilet seat or a handicapped toilet?: No  TIMED UP AND GO:  Was the test performed?  No  Cognitive Function: 6CIT completed        05/19/2023    2:13 PM 05/18/2022   11:14 AM  6CIT Screen  What Year? 0 points 0 points  What month? 0 points 0 points  What time? 0 points 0 points  Count back from 20 0 points 0 points  Months in reverse 0 points 0 points  Repeat phrase 0 points 0 points  Total Score 0 points 0 points    Immunizations Immunization History  Administered Date(s) Administered   Influenza,inj,Quad PF,6+ Mos 02/22/2014, 04/01/2015, 11/23/2016, 11/24/2017, 12/06/2018   Influenza-Unspecified 04/15/2016, 11/23/2021, 01/17/2023   Pneumococcal Conjugate-13 04/22/2016   Pneumococcal Polysaccharide-23 05/04/2016   RSV,unspecified 11/23/2021   Tdap 09/14/2016   Zoster Recombinant(Shingrix) 06/18/2017, 08/19/2017    Screening Tests Health Maintenance  Topic Date Due   Pneumonia Vaccine 52+ Years old (3 of 3 - PPSV23 or PCV20) 03/29/2023   Diabetic kidney evaluation - Urine ACR  08/30/2023   HEMOGLOBIN A1C  09/05/2023   OPHTHALMOLOGY EXAM  01/25/2024   Diabetic kidney evaluation - eGFR measurement  03/06/2024   FOOT EXAM   03/06/2024   Lung Cancer Screening  03/31/2024   Medicare Annual Wellness (AWV)  05/18/2024   Colonoscopy  12/16/2024   DTaP/Tdap/Td (2 - Td or Tdap) 09/15/2026   INFLUENZA VACCINE  Completed   Hepatitis C Screening  Completed   HIV Screening  Completed   Zoster Vaccines- Shingrix  Completed   HPV VACCINES  Aged Out   COVID-19 Vaccine  Discontinued    Health Maintenance  Health Maintenance Due  Topic Date Due   Pneumonia Vaccine 12+ Years old (3 of 3 - PPSV23 or PCV20) 03/29/2023   Health Maintenance Items Addressed:05/19/2023   Additional Screening:  Vision Screening: Recommended annual ophthalmology exams for early detection of glaucoma and other disorders of the eye.  Patient stated plans to have eye exam in 01/2024 with [facility].  Dental Screening: Recommended annual dental exams for proper oral hygiene  Community Resource Referral / Chronic Care Management: CRR required this visit?  No   CCM required this visit?  No     Plan:     I have personally reviewed and noted the following in the patient's chart:   Medical and social history Use of alcohol, tobacco or illicit drugs  Current medications and supplements including opioid prescriptions. Patient is not currently taking  opioid prescriptions. Functional ability and status Nutritional status Physical activity Advanced directives List of other physicians Hospitalizations, surgeries, and ER visits in previous 12 months Vitals Screenings to include cognitive, depression, and falls Referrals and appointments  In addition, I have reviewed and discussed with patient certain preventive protocols, quality metrics, and best practice recommendations. A written personalized care plan for preventive services as well as general preventive health recommendations were provided to patient.     Darreld Mclean, CMA   05/19/2023   After Visit Summary: (Mail) Due to this being a telephonic visit, the after visit summary  with patients personalized plan was offered to patient via mail   Notes:  Lung Cancer Screening was completed 04/06/2023 and f/u is 03/31/2024.  Educated and advised on getting the Pneumonia vaccine at local pharmacy.

## 2023-05-19 NOTE — Patient Instructions (Addendum)
 [name] , Thank you for taking time to come for your Medicare Wellness Visit. I appreciate your ongoing commitment to your health goals. Please review the following plan we discussed and let me know if I can assist you in the future.   Referrals/Orders/Follow-Ups/Clinician Recommendations: Aim for 30 minutes of exercise or brisk walking, 6-8 glasses of water, and 5 servings of fruits and vegetables each day. Lung Cancer Screening follow-up is scheduled for 03/31/2024.  Educated and advised on getting the Pneumonia vaccine at local pharmacy.    This is a list of the screening recommended for you and due dates:  Health Maintenance  Topic Date Due   Pneumonia Vaccine (3 of 3 - PPSV23 or PCV20) 03/29/2023   Yearly kidney health urinalysis for diabetes  08/30/2023   Hemoglobin A1C  09/05/2023   Eye exam for diabetics  01/25/2024   Yearly kidney function blood test for diabetes  03/06/2024   Complete foot exam   03/06/2024   Screening for Lung Cancer  03/31/2024   Medicare Annual Wellness Visit  05/18/2024   Colon Cancer Screening  12/16/2024   DTaP/Tdap/Td vaccine (2 - Td or Tdap) 09/15/2026   Flu Shot  Completed   Hepatitis C Screening  Completed   HIV Screening  Completed   Zoster (Shingles) Vaccine  Completed   HPV Vaccine  Aged Out   COVID-19 Vaccine  Discontinued    Advanced directives: (Provided) Advance directive discussed with you today. I have provided a copy for you to complete at home and have notarized. Once this is complete, please bring a copy in to our office so we can scan it into your chart.   Next Medicare Annual Wellness Visit scheduled for next year: Yes - 2026

## 2023-05-31 ENCOUNTER — Ambulatory Visit: Payer: Self-pay | Admitting: Internal Medicine

## 2023-05-31 NOTE — Telephone Encounter (Signed)
  Chief Complaint: back and leg pain Symptoms: pain Frequency: Constant Pertinent Negatives: Patient denies numbness, tingling, head injury, bladder issues Disposition: [] ED /[] Urgent Care (no appt availability in office) / [x] Appointment(In office/virtual)/ []  [facility]/ [] Home Care/ [] Refused Recommended Disposition /[] [facility]/ []  Follow-up with PCP Additional Notes:  Approximately 1.5 weeks ago he tripped over his cat and fell to the floor, did not hit his head, has not been evaluated for injury. He reports back and right leg pain after fall, he has been using OTC Tylenol with fair effect, "it dulls the pain". When he ambulates the pain can go as high as 10/10. No numbness or tingling. Acute visit scheduled with PCP 06/01/23. Care advice discussed as documented in protocol.      Copied from CRM [id number]. Topic: Clinical - Red Word Triage >> May 31, 2023 10:13 AM Orinda Kenner C wrote: Red Word that prompted transfer to Nurse Triage: Patient dentist recommends for an oral surgeon and recommended to see pcp for an ENT referral. Patient wants to see Dr. Jonny Ruiz. Also, patient states about a week and half ago patient tripped over his cat, fell on side, having pain on back, right hip going down to right leg. Please advise [phone]. Reason for Disposition  [1] MODERATE pain (e.g., interferes with normal activities) AND [2] high-risk adult (e.g., age > 60 years, osteoporosis, chronic steroid use)  Protocols used: Back Injury-A-AH

## 2023-06-01 ENCOUNTER — Ambulatory Visit (INDEPENDENT_AMBULATORY_CARE_PROVIDER_SITE_OTHER): Admitting: Internal Medicine

## 2023-06-01 VITALS — BP 120/80 | HR 85 | Temp 98.3°F | Ht 69.5 in | Wt 195.0 lb

## 2023-06-01 DIAGNOSIS — M5416 Radiculopathy, lumbar region: Secondary | ICD-10-CM | POA: Insufficient documentation

## 2023-06-01 DIAGNOSIS — I10 Essential (primary) hypertension: Secondary | ICD-10-CM

## 2023-06-01 DIAGNOSIS — E559 Vitamin D deficiency, unspecified: Secondary | ICD-10-CM | POA: Diagnosis not present

## 2023-06-01 DIAGNOSIS — K1370 Unspecified lesions of oral mucosa: Secondary | ICD-10-CM

## 2023-06-01 DIAGNOSIS — F17200 Nicotine dependence, unspecified, uncomplicated: Secondary | ICD-10-CM | POA: Diagnosis not present

## 2023-06-01 MED ORDER — GABAPENTIN 100 MG PO CAPS: 100.0000 mg | ORAL_CAPSULE | Freq: Three times a day (TID) | ORAL | 3 refills | Status: DC

## 2023-06-01 MED ORDER — PREDNISONE 10 MG PO TABS: ORAL_TABLET | ORAL | 0 refills | Status: DC

## 2023-06-01 MED ORDER — HYDROCODONE-ACETAMINOPHEN 7.5-325 MG PO TABS: 1.0000 | ORAL_TABLET | Freq: Four times a day (QID) | ORAL | 0 refills | Status: DC | PRN

## 2023-06-01 NOTE — Patient Instructions (Signed)
 Please take all new medication as prescribed - the hydrocodone as needed for pain (and call if you need refill)  Please take all new medication as prescribed - the gabapentin 100 mg three times per day (but call for higher dose in 1 week if taking this ok)  Please take all new medication as prescribed  - the prednisone  Please stop smoking  Please continue all other medications as before, and refills have been done if requested.  Please have the pharmacy call with any other refills you may need..  Please keep your appointments with your specialists as you may have planned  You will be contacted regarding the referral for: MRI for the lower back, [facility], and Oral surgury

## 2023-06-01 NOTE — Progress Notes (Signed)
 Patient ID: Ethan Ellis, male   DOB: 1960-12-09, 63 y.o.   MRN: [id number]        Chief Complaint: follow up new worsening right lumbar radiculopathy, mouth lesion, smoking, low vit d       HPI:  Ethan Ellis is a 63 y.o. male here with acute on chronic right lower back pain with radiation to the distal RLE with numbness and weakness but no falls; pain now up to 10/10 at times, Pt denies chest pain, increased sob or doe, wheezing, orthopnea, PND, increased LE swelling, palpitations, dizziness or syncope.   Pt denies polydipsia, polyuria, or new focal neuro s/s.    Pt denies fever, wt loss, night sweats, loss of appetite, or other constitutional symptoms  Did also see dentist recently with mention of oral lesion        Wt Readings from Last 3 Encounters:  06/01/23 195 lb (88.5 kg)  05/19/23 193 lb (87.5 kg)  03/07/23 193 lb 4 oz (87.7 kg)   BP Readings from Last 3 Encounters:  06/01/23 120/80  08/30/22 132/80  02/03/22 120/68         Past Medical History:  Diagnosis Date   Depression    Diabetes mellitus without complication (HCC)    type 2   History of kidney stones    passed stone - no surgery   Hyperlipidemia 03/10/2014   Hypertension    LBP (low back pain) 2009   Right   Unspecified viral hepatitis C without hepatic coma 04/04/2015   Past Surgical History:  Procedure Laterality Date   APPENDECTOMY     BACK SURGERY     lower back    COLONOSCOPY  12/06/2014   Dr.Stark   INGUINAL HERNIA REPAIR Left 04/26/2019   Procedure: LEFT INGUINAL HERNIA REPAIR WITH MESH;  Surgeon: Manus Rudd, MD;  Location: [facility];  Service: General;  Laterality: Left;  LMA AND TAP BLOCK   LESION REMOVAL Left 04/26/2019   Procedure: EXCISION OF SKIN LESION LEFT GROIN AND BASE OF PENIS;  Surgeon: Manus Rudd, MD;  Location: [facility];  Service: General;  Laterality: Left;   REVERSE SHOULDER ARTHROPLASTY Left 12/25/2020   Procedure: LEFT REVERSE SHOULDER  REPLACEMENT;  Surgeon: Cammy Copa, MD;  Location: [facility] OR;  Service: Orthopedics;  Laterality: Left;   SHOULDER SURGERY     right - x 3 surgery    reports that he has been smoking cigarettes. He has a 23 pack-year smoking history. He has been exposed to tobacco smoke. He has never used smokeless tobacco. He reports that he does not currently use drugs. He reports that he does not drink alcohol. family history includes Diabetes in his father. Allergies  Allergen Reactions   Naproxen Swelling and Other (See Comments)    Mouth swelling- patient can take advil and some other NSAIDs w/o problems, however   Current Outpatient Medications on File Prior to Visit  Medication Sig Dispense Refill   acetaminophen (TYLENOL) 650 MG CR tablet Take 1,950 mg by mouth 2 (two) times daily.     albuterol (VENTOLIN HFA) 108 (90 Base) MCG/ACT inhaler Inhale 2 puffs into the lungs every 6 (six) hours as needed for wheezing or shortness of breath. 54 g 3   aspirin EC 81 MG tablet Take 1 tablet (81 mg total) by mouth daily. 90 tablet 11   atorvastatin (LIPITOR) 20 MG tablet Take 1 tablet (20 mg total) by mouth daily. 90 tablet 3  Blood Glucose Calibration (ACCU-CHEK GUIDE CONTROL) LIQD Use as directed once daily e11.9 1 each 5   Blood Glucose Monitoring Suppl (ACCU-CHEK GUIDE ME) w/Device KIT Use as directed once daily E11.9 1 kit 0   Cholecalciferol (VITAMIN D-3 PO) Take 1 capsule by mouth daily.     citalopram (CELEXA) 20 MG tablet TAKE 1 TABLET EVERY DAY 90 tablet 3   clotrimazole-betamethasone (LOTRISONE) cream Use as directed to affected area twice daily as needed 15 g 1   Fluticasone-Umeclidin-Vilant (TRELEGY ELLIPTA) 100-62.5-25 MCG/ACT AEPB Inhale 1 puff into the lungs daily. 3 each 3   glipiZIDE (GLUCOTROL XL) 10 MG 24 hr tablet TAKE 1 TABLET ONE TIME DAILY WITH BREAKFAST 90 tablet 3   glucose blood (ACCU-CHEK GUIDE) test strip Use as instructed once daily E11.9 100 each 12   hydrochlorothiazide  (HYDRODIURIL) 25 MG tablet TAKE 1 TABLET EVERY DAY 90 tablet 3   Lancets MISC Use as directed once daily to check blood sugar.  Diagnosis code E11.9 100 each 11   losartan (COZAAR) 100 MG tablet Take 1 tablet (100 mg total) by mouth daily. 90 tablet 3   losartan (COZAAR) 100 MG tablet TAKE 1 TABLET EVERY DAY 90 tablet 3   metFORMIN (GLUCOPHAGE) 500 MG tablet TAKE 2 TABLETS EVERY MORNING AND TAKE 1 TABLET EVERY EVENING 270 tablet 3   nortriptyline (PAMELOR) 50 MG capsule TAKE 1 CAPSULE BY MOUTH THREE TIMES DAILY 270 capsule 3   tamsulosin (FLOMAX) 0.4 MG CAPS capsule TAKE 1 CAPSULE EVERY DAY 90 capsule 3   tiZANidine (ZANAFLEX) 4 MG tablet TAKE 3 TABLETS AT BEDTIME 270 tablet 10   No current facility-administered medications on file prior to visit.        ROS:  All others reviewed and negative.  Objective        PE:  BP 120/80 (BP Location: Left Arm, Patient Position: Sitting, Cuff Size: Normal)   Pulse 85   Temp 98.3 F (36.8 C) (Oral)   Ht 5' 9.5" (1.765 m)   Wt 195 lb (88.5 kg)   BMI 28.38 kg/m                 Constitutional: Pt appears in NAD               HENT: Head: NCAT.                Right Ear: External ear normal.                 Left Ear: External ear normal.                Eyes: . Pupils are equal, round, and reactive to light. Conjunctivae and EOM are normal               Nose: without d/c or deformity               Neck: Neck supple. Gross normal ROM               Cardiovascular: Normal rate and regular rhythm.                 Pulmonary/Chest: Effort normal and breath sounds without rales or wheezing.                Abd:  Soft, NT, ND, + BS, no organomegaly               Neurological: Pt is alert. At baseline orientation, motor with 4/5  RLE weakness; spine nontender in midline               Skin: Skin is warm. No rashes, no other new lesions, LE edema - none               Psychiatric: Pt behavior is normal without agitation   Micro: none  Cardiac tracings I have  personally interpreted today:  none  Pertinent Radiological findings (summarize): none   Lab Results  Component Value Date   WBC 11.9 (H) 08/30/2022   HGB 15.5 08/30/2022   HCT 47.0 08/30/2022   PLT 263.0 08/30/2022   GLUCOSE 83 03/07/2023   CHOL 146 03/07/2023   TRIG 300.0 (H) 03/07/2023   HDL 35.50 (L) 03/07/2023   LDLDIRECT 82.0 08/30/2022   LDLCALC 50 03/07/2023   ALT 19 03/07/2023   AST 13 03/07/2023   NA 140 03/07/2023   K 4.1 03/07/2023   CL 101 03/07/2023   CREATININE 0.81 03/07/2023   BUN 13 03/07/2023   CO2 31 03/07/2023   TSH 1.36 08/30/2022   PSA 1.69 08/30/2022   HGBA1C 7.0 (H) 03/07/2023   MICROALBUR 0.7 08/30/2022   Assessment/Plan:  Ethan Ellis is a 63 y.o. White or Caucasian [1] male with  has a past medical history of Depression, Diabetes mellitus without complication (HCC), History of kidney stones, Hyperlipidemia (03/10/2014), Hypertension, LBP (low back pain) (2009), and Unspecified viral hepatitis C without hepatic coma (04/04/2015).  Vitamin D deficiency Last vitamin D Lab Results  Component Value Date   VD25OH 16.41 (L) 08/30/2022   Low, to start oral replacement   Right lumbar radiculopathy New onst acute on chronic with severe pain, for hydrocodone prn, gabapentin 100 tid with intent to titrate, prednisone taper,  LS spine MRI, refer [facility] per pt request,   Mouth lesion Now for oral surgury referral  Tobacco dependence Pt counseld to quit, pt not ready  Uncontrolled hypertension BP Readings from Last 3 Encounters:  06/01/23 120/80  08/30/22 132/80  02/03/22 120/68   Stable, pt to continue medical treatment losartan 100 every day, hct 25 qd   Followup: Return if symptoms worsen or fail to improve.  Oliver Barre, MD 06/04/2023 2:53 PM [facility]

## 2023-06-04 ENCOUNTER — Encounter: Payer: Self-pay | Admitting: Internal Medicine

## 2023-06-04 NOTE — Assessment & Plan Note (Signed)
 Now for oral surgury referral

## 2023-06-04 NOTE — Assessment & Plan Note (Signed)
Last vitamin D Lab Results  Component Value Date   VD25OH 16.41 (L) 08/30/2022   Low, to start oral replacement

## 2023-06-04 NOTE — Assessment & Plan Note (Signed)
 New onst acute on chronic with severe pain, for hydrocodone prn, gabapentin 100 tid with intent to titrate, prednisone taper,  LS spine MRI, refer [facility]  per pt request,

## 2023-06-04 NOTE — Assessment & Plan Note (Signed)
 BP Readings from Last 3 Encounters:  06/01/23 120/80  08/30/22 132/80  02/03/22 120/68   Stable, pt to continue medical treatment losartan 100 every day, hct 25 qd

## 2023-06-04 NOTE — Assessment & Plan Note (Signed)
Pt counseld to quit, pt not ready ?

## 2023-06-11 ENCOUNTER — Encounter: Payer: Self-pay | Admitting: Internal Medicine

## 2023-06-14 ENCOUNTER — Encounter: Payer: Self-pay | Admitting: Internal Medicine

## 2023-06-16 ENCOUNTER — Ambulatory Visit
Admission: RE | Admit: 2023-06-16 | Discharge: 2023-06-16 | Disposition: A | Discharge status: Home / Self Care | Source: Ambulatory Visit | Attending: Internal Medicine | Admitting: Internal Medicine

## 2023-06-16 DIAGNOSIS — M5416 Radiculopathy, lumbar region: Secondary | ICD-10-CM

## 2023-06-16 DIAGNOSIS — M4726 Other spondylosis with radiculopathy, lumbar region: Secondary | ICD-10-CM | POA: Diagnosis not present

## 2023-06-27 ENCOUNTER — Ambulatory Visit (INDEPENDENT_AMBULATORY_CARE_PROVIDER_SITE_OTHER): Admitting: Orthopedic Surgery

## 2023-06-27 ENCOUNTER — Other Ambulatory Visit (INDEPENDENT_AMBULATORY_CARE_PROVIDER_SITE_OTHER)

## 2023-06-27 VITALS — BP 146/84 | HR 79 | Ht 69.5 in | Wt 195.0 lb

## 2023-06-27 DIAGNOSIS — M5416 Radiculopathy, lumbar region: Secondary | ICD-10-CM

## 2023-06-27 DIAGNOSIS — M545 Low back pain, unspecified: Secondary | ICD-10-CM | POA: Diagnosis not present

## 2023-06-27 DIAGNOSIS — Z720 Tobacco use: Secondary | ICD-10-CM

## 2023-06-27 MED ORDER — TRAMADOL HCL 50 MG PO TABS: 50.0000 mg | ORAL_TABLET | Freq: Four times a day (QID) | ORAL | 0 refills | Status: AC | PRN

## 2023-06-27 NOTE — Progress Notes (Signed)
 Orthopedic Spine Surgery Office Note  Assessment: Patient is a 63 y.o. male with radiating right leg pain.  He feels it in the groin and down the lateral aspect of the thigh into the anterior leg.  Has stenosis at L3/4 and L4/5   Plan: -Explained that initially conservative treatment is tried as a significant number of patients may experience relief with these treatment modalities. Discussed that the conservative treatments include:  -activity modification  -physical therapy  -over the counter pain medications  -medrol dosepak  -lumbar steroid injections -Patient has tried Tylenol, gabapentin, hydrocodone -Prescribed tramadol for pain relief -Recommended diagnostic/therapeutic injections.  His pain seems to be in 2 distinct distributions. He has groin and medial thigh pain that seems like an L2 distribution but his disc herniation at L1/2 is on the contralateral side. He also has pain that radiates down the entire leg into the anterior and lateral aspect that seems consistent with L4 or L5 radiculopathy. His groin and medial thigh pain just may represent atypical distribution of an L4 or L5 radiculopathy -Patient should return to office in 6 weeks, x-rays at next visit: none   Spoke to patient for 5 minutes about some of the overall health risks associated with nicotine use. I also covered some of the specific risks associated with surgery including higher rates of nonunion and infection. I encouraged him to quit. He expressed understanding and said he would work on quitting. He said it would be difficult though since he has been smoking for years.   ___________________________________________________________________________   History:  Patient is a 63 y.o. male who presents today for lumbar spine.  Patient has had about 6 weeks of radiating right leg pain.  He feels the going into the groin and the medial thigh on the right side.  He also has pain that radiates down the lateral aspect of  his thigh into the anterior leg.  He also feels it in the lateral aspect of the leg.  There was no trauma or injury that preceded the onset of pain.  Pain has been consistent since onset.  He notices the pain particularly when walking or standing for more than a few minutes.  However, he also has noticed it when laying down at night.  He does not notice it as much when he is sitting down.  He has not had pain like this in the past.  He has tried several treatments with his primary care doctor but none of them have right him with lasting relief.   Weakness: Yes, sometimes his right leg feels weaker.  No other weakness noted Symptoms of imbalance: Denies Paresthesias and numbness: Denies Bowel or bladder incontinence: Denies Saddle anesthesia: Denies  Treatments tried: Tylenol, gabapentin, hydrocodone  Review of systems: Denies fevers and chills, night sweats, unexplained weight loss, history of cancer.  Has had pain that wakes him at night  Past medical history: HLD HTN DM  (last A1c was 7.0 on 03/07/2023) Chronic low back pain  Allergies: Naproxen  Past surgical history:  L5/S1 microdiscectomy Appendectomy Hernia repair Left rTSA Right should rotator cuff repair  Social history: Reports use of nicotine product (smoking, vaping, patches, smokeless) Alcohol use: denies Denies recreational drug use   Physical Exam:  BMI of 28.4  General: no acute distress, appears stated age Neurologic: alert, answering questions appropriately, following commands Respiratory: unlabored breathing on room air, symmetric chest rise Psychiatric: appropriate affect, normal cadence to speech   MSK (spine):  -Strength exam  Left  Right EHL    5/5  5/5 TA    5/5  5/5 GSC    5/5  5/5 Knee extension  5/5  5/5 Hip flexion   5/5  5/5  -Sensory exam    Sensation intact to light touch in L3-S1 nerve distributions of bilateral lower extremities  -Achilles DTR: 2/4 on the left, 2/4 on the  right -Patellar tendon DTR: 2/4 on the left, 2/4 on the right  -Straight leg raise: negative bilaterally -Femoral nerve stretch test: negative bilaterally -Clonus: no beats bilaterally  -Left hip exam: no pain through range of motion, negative stinchfield, negative FABER -Right hip exam: no pain through range of motion, negative stinchfield, negative FABER  Imaging: XRs of the lumbar spine from 06/27/2023 was independently reviewed and interpreted, showing disc height loss at L3/4, L4/5, L5/S1.  Anterior osteophyte formation seen at L4/5 and L5/S1.  Loss of lumbar lordosis.  No evidence of instability on flexion/extension views.  No fracture or dislocation seen.  MRI of the lumbar spine from 06/16/2023 was independently reviewed and interpreted, showing left paracentral disc herniation at L1/2 causing lateral recess stenosis.  Central lateral recess stenosis at L3/4.  Right-sided foraminal, bilateral lateral recess, and central stenosis at L4/5.  Bilateral foraminal stenosis at L5/S1.  DDD at L3/4, L4/5, and L5/S1.  Hemilaminotomy defect on the right at L5/S1.   Patient name: Ethan Ellis Patient MRN: [id number] Date of visit: 06/27/23

## 2023-07-07 ENCOUNTER — Ambulatory Visit: Admitting: Physical Medicine and Rehabilitation

## 2023-07-07 ENCOUNTER — Other Ambulatory Visit: Payer: Self-pay

## 2023-07-07 VITALS — BP 150/84 | HR 87

## 2023-07-07 DIAGNOSIS — M5416 Radiculopathy, lumbar region: Secondary | ICD-10-CM

## 2023-07-07 MED ORDER — METHYLPREDNISOLONE ACETATE 40 MG/ML IJ SUSP
40.0000 mg | Freq: Once | INTRAMUSCULAR | Status: AC
  Administered 2023-07-07: 40 mg

## 2023-07-07 NOTE — Progress Notes (Signed)
 Pain Scale   Average Pain 8 Patient advised his lower back pain radiates bilaterally to bot legs and also increases at night.         +Driver, -BT, -Dye Allergies.

## 2023-07-11 ENCOUNTER — Encounter: Payer: Self-pay | Admitting: Internal Medicine

## 2023-07-12 NOTE — Progress Notes (Signed)
 SHIGERU LAMBERTH - 63 y.o. male MRN [id number]  Date of birth: 01-08-1961  Office Visit Note: Visit Date: 07/07/2023 PCP: Roslyn Coombe, MD Referred by: Diedra Fowler, MD  Subjective: Chief Complaint  Patient presents with   Lower Back - Pain   HPI:  LUTE SCHMIEDER is a 63 y.o. male who comes in today at the request of Dr. Colette Davies for planned Right L4-5 Lumbar Transforaminal epidural steroid injection with fluoroscopic guidance.  The patient has failed conservative care including home exercise, medications, time and activity modification.  This injection will be diagnostic and hopefully therapeutic.  Please see requesting physician notes for further details and justification.   ROS Otherwise per HPI.  Assessment & Plan: Visit Diagnoses:    ICD-10-CM   1. Radiculopathy, lumbar region  M54.16 XR C-ARM NO REPORT    Epidural Steroid injection    methylPREDNISolone  acetate (DEPO-MEDROL ) injection 40 mg      Plan: No additional findings.   Meds & Orders:  Meds ordered this encounter  Medications   methylPREDNISolone  acetate (DEPO-MEDROL ) injection 40 mg    Orders Placed This Encounter  Procedures   XR C-ARM NO REPORT   Epidural Steroid injection    Follow-up: Return for visit to requesting provider as needed.   Procedures: No procedures performed  Lumbosacral Transforaminal Epidural Steroid Injection - Sub-Pedicular Approach with Fluoroscopic Guidance  Patient: JAHRI EPPERSON      Date of Birth: Jan 29, 1961 MRN: [id number] PCP: Roslyn Coombe, MD      Visit Date: 07/07/2023   Universal Protocol:    Date/Time: 07/07/2023  Consent Given By: the patient  Position: PRONE  Additional Comments: Vital signs were monitored before and after the procedure. Patient was prepped and draped in the usual sterile fashion. The correct patient, procedure, and site was verified.   Injection Procedure Details:   Procedure diagnoses: Radiculopathy, lumbar region [M54.16]     Meds Administered:  Meds ordered this encounter  Medications   methylPREDNISolone  acetate (DEPO-MEDROL ) injection 40 mg    Laterality: Right  Location/Site: L4  Needle:5.0 in., 22 ga.  Short bevel or Quincke spinal needle  Needle Placement: Transforaminal  Findings:    -Comments: Excellent flow of contrast along the nerve, nerve root and into the epidural space.  Procedure Details: After squaring off the end-plates to get a true AP view, the C-arm was positioned so that an oblique view of the foramen as noted above was visualized. The target area is just inferior to the "nose of the scotty dog" or sub pedicular. The soft tissues overlying this structure were infiltrated with 2-3 ml. of 1% Lidocaine  without Epinephrine .  The spinal needle was inserted toward the target using a "trajectory" view along the fluoroscope beam.  Under AP and lateral visualization, the needle was advanced so it did not puncture dura and was located close the 6 O'Clock position of the pedical in AP tracterory. Biplanar projections were used to confirm position. Aspiration was confirmed to be negative for CSF and/or blood. A 1-2 ml. volume of Isovue -250 was injected and flow of contrast was noted at each level. Radiographs were obtained for documentation purposes.   After attaining the desired flow of contrast documented above, a 0.5 to 1.0 ml test dose of 0.25% Marcaine  was injected into each respective transforaminal space.  The patient was observed for 90 seconds post injection.  After no sensory deficits were reported, and normal lower extremity motor function was noted,   the  above injectate was administered so that equal amounts of the injectate were placed at each foramen (level) into the transforaminal epidural space.   Additional Comments:  The patient tolerated the procedure well Dressing: 2 x 2 sterile gauze and Band-Aid    Post-procedure details: Patient was observed during the  procedure. Post-procedure instructions were reviewed.  Patient left the clinic in stable condition.    Clinical History: MRI of the lumbar spine from 06/16/2023 was independently reviewed and interpreted, showing left paracentral disc herniation at L1/2 causing lateral recess stenosis.  Central lateral recess stenosis at L3/4.  Right-sided foraminal, bilateral lateral recess, and central stenosis at L4/5.  Bilateral foraminal stenosis at L5/S1.  DDD at L3/4, L4/5, and L5/S1.  Hemilaminotomy defect on the right at L5/S1  ------- MRI CERVICAL SPINE WITHOUT CONTRAST   TECHNIQUE: Multiplanar, multisequence MR imaging of the cervical spine was performed. No intravenous contrast was administered.   COMPARISON:  Cervical spine radiographs 09/23/2021 head MRI 09/02/2020.   FINDINGS: Alignment: Straightening of the normal cervical lordosis. Trace anterolisthesis of C4 on C5 and trace retrolisthesis of C5 on C6.   Vertebrae: No fracture or suspicious marrow lesion. Degenerative endplate changes at C5-6 and C6-7 including very mild edema and associated moderate disc space narrowing.   Cord: Normal signal and morphology.   Posterior Fossa, vertebral arteries, paraspinal tissues: T2 hyperintensities in the pons compatible with chronic small vessel ischemia as shown on the prior head MRI, including a chronic lacunar infarct on the right. Preserved vertebral artery flow voids.   Disc levels:   C2-3: Mild facet arthrosis without disc herniation or stenosis.   C3-4: Disc bulging, uncovertebral spurring, and mild right and severe left facet arthrosis result in mild spinal stenosis and borderline to mild right and severe left neural foraminal stenosis.   C4-5: Disc bulging, left uncovertebral spurring, and mild right and severe left facet arthrosis result in borderline spinal stenosis and severe left neural foraminal stenosis.   C5-6: Disc bulging and uncovertebral spurring result in mild  spinal stenosis and moderate to severe right and severe left neural foraminal stenosis.   C6-7: Disc bulging, uncovertebral spurring, and mild facet arthrosis result in mild spinal stenosis and moderate to severe left greater than right neural foraminal stenosis.   C7-T1: Moderate left facet arthrosis without disc herniation or stenosis.   IMPRESSION: Multilevel cervical disc and facet degeneration with mild spinal stenosis and moderate to severe neural foraminal stenosis from C3-4 through C6-7.     Electronically Signed   By: Aundra Lee M.D.   On: 10/06/2021 10:13     Objective:  VS:  HT:    WT:   BMI:     BP:(!) 150/84  HR:87bpm  TEMP: ( )  RESP:  Physical Exam Vitals and nursing note reviewed.  Constitutional:      General: He is not in acute distress.    Appearance: Normal appearance. He is not ill-appearing.  HENT:     Head: Normocephalic and atraumatic.     Right Ear: External ear normal.     Left Ear: External ear normal.     Nose: No congestion.  Eyes:     Extraocular Movements: Extraocular movements intact.  Cardiovascular:     Rate and Rhythm: Normal rate.     Pulses: Normal pulses.  Pulmonary:     Effort: Pulmonary effort is normal. No respiratory distress.  Abdominal:     General: There is no distension.     Palpations: Abdomen is  soft.  Musculoskeletal:        General: No tenderness or signs of injury.     Cervical back: Neck supple.     Right lower leg: No edema.     Left lower leg: No edema.     Comments: Patient has good distal strength without clonus.  Skin:    Findings: No erythema or rash.  Neurological:     General: No focal deficit present.     Mental Status: He is alert and oriented to person, place, and time.     Sensory: No sensory deficit.     Motor: No weakness or abnormal muscle tone.     Coordination: Coordination normal.  Psychiatric:        Mood and Affect: Mood normal.        Behavior: Behavior normal.       Imaging: No results found.

## 2023-07-12 NOTE — Procedures (Signed)
 Lumbosacral Transforaminal Epidural Steroid Injection - Sub-Pedicular Approach with Fluoroscopic Guidance  Patient: Ethan Ellis      Date of Birth: 11-07-1960 MRN: [id number] PCP: Roslyn Coombe, MD      Visit Date: 07/07/2023   Universal Protocol:    Date/Time: 07/07/2023  Consent Given By: the patient  Position: PRONE  Additional Comments: Vital signs were monitored before and after the procedure. Patient was prepped and draped in the usual sterile fashion. The correct patient, procedure, and site was verified.   Injection Procedure Details:   Procedure diagnoses: Radiculopathy, lumbar region [M54.16]    Meds Administered:  Meds ordered this encounter  Medications   methylPREDNISolone  acetate (DEPO-MEDROL ) injection 40 mg    Laterality: Right  Location/Site: L4  Needle:5.0 in., 22 ga.  Short bevel or Quincke spinal needle  Needle Placement: Transforaminal  Findings:    -Comments: Excellent flow of contrast along the nerve, nerve root and into the epidural space.  Procedure Details: After squaring off the end-plates to get a true AP view, the C-arm was positioned so that an oblique view of the foramen as noted above was visualized. The target area is just inferior to the "nose of the scotty dog" or sub pedicular. The soft tissues overlying this structure were infiltrated with 2-3 ml. of 1% Lidocaine  without Epinephrine .  The spinal needle was inserted toward the target using a "trajectory" view along the fluoroscope beam.  Under AP and lateral visualization, the needle was advanced so it did not puncture dura and was located close the 6 O'Clock position of the pedical in AP tracterory. Biplanar projections were used to confirm position. Aspiration was confirmed to be negative for CSF and/or blood. A 1-2 ml. volume of Isovue -250 was injected and flow of contrast was noted at each level. Radiographs were obtained for documentation purposes.   After attaining the desired  flow of contrast documented above, a 0.5 to 1.0 ml test dose of 0.25% Marcaine  was injected into each respective transforaminal space.  The patient was observed for 90 seconds post injection.  After no sensory deficits were reported, and normal lower extremity motor function was noted,   the above injectate was administered so that equal amounts of the injectate were placed at each foramen (level) into the transforaminal epidural space.   Additional Comments:  The patient tolerated the procedure well Dressing: 2 x 2 sterile gauze and Band-Aid    Post-procedure details: Patient was observed during the procedure. Post-procedure instructions were reviewed.  Patient left the clinic in stable condition.

## 2023-08-02 ENCOUNTER — Ambulatory Visit (INDEPENDENT_AMBULATORY_CARE_PROVIDER_SITE_OTHER): Admitting: Orthopedic Surgery

## 2023-08-02 DIAGNOSIS — M5416 Radiculopathy, lumbar region: Secondary | ICD-10-CM | POA: Diagnosis not present

## 2023-08-02 NOTE — Progress Notes (Signed)
 Orthopedic Spine Surgery Office Note   Assessment: Patient is a 63 y.o. male with radiating right leg pain. Has now developed pain in the left lateral thigh too. He feels it in the groin and down the lateral aspect of the thigh into the anterior leg.  Has stenosis at L3/4 and L4/5     Plan: -Patient has tried Tylenol , gabapentin , hydrocodone , tramadol , lumbar steroid injection -Patient has now tried multiple conservative treatments and continues to have symptoms so discussed surgery as an option.  He is not interested in any kind of surgery unless he can get 100% guarantee it will help with his pain.  I explained that I could not give him any guarantee so he wanted to continue with nonoperative treatment -Told him he can take up to 1000 mg of Tylenol  3 times daily.  He can also use Aleve but use that more sparingly as it has potential to cause kidney injury and ulcers.  He can do periodic injections since he has gotten relief with those -Patient would need to be nicotine free prior to any elective spine surgery -Patient should return to office on an as-needed basis   ___________________________________________________________________________     History:   Patient is a 63 y.o. male who presents today for follow-up on his lumbar spine.  Patient continues to have right leg pain.  Feels some pain in the groin particularly when twisting through the hip.  He also has pain along the lateral aspect of the thigh and anterior leg.  Recently, he has developed left leg numbness and pain along the lateral aspect of the thigh.  He does not have any pain or numbness radiating past the left knee.  He has not developed any other new symptoms since he was last seen in the office.  He did get an injection with Dr. Daisey Dryer which she said gave him about 70% relief.  He is still noticing relief with that injection.   Treatments tried: Tylenol , gabapentin , hydrocodone , tramadol , lumbar steroid injection     Physical  Exam:   General: no acute distress, appears stated age Neurologic: alert, answering questions appropriately, following commands Respiratory: unlabored breathing on room air, symmetric chest rise Psychiatric: appropriate affect, normal cadence to speech     MSK (spine):   -Strength exam                                                   Left                  Right EHL                              5/5                  5/5 TA                                 5/5                  5/5 GSC                             5/5  5/5 Knee extension            5/5                  5/5 Hip flexion                    5/5                  5/5   -Sensory exam                           Sensation intact to light touch in L3-S1 nerve distributions of bilateral lower extremities    Imaging: XRs of the lumbar spine from 06/27/2023 were previously independently reviewed and interpreted, showing disc height loss at L3/4, L4/5, L5/S1.  Anterior osteophyte formation seen at L4/5 and L5/S1.  Loss of lumbar lordosis.  No evidence of instability on flexion/extension views.  No fracture or dislocation seen.   MRI of the lumbar spine from 06/16/2023 was previously independently reviewed and interpreted, showing left paracentral disc herniation at L1/2 causing lateral recess stenosis.  Central lateral recess stenosis at L3/4.  Right-sided foraminal, bilateral lateral recess, and central stenosis at L4/5.  Bilateral foraminal stenosis at L5/S1.  DDD at L3/4, L4/5, and L5/S1.  Hemilaminotomy defect on the right at L5/S1.     Patient name: Ethan Ellis Patient MRN: [id number] Date of visit: 08/02/23

## 2023-09-05 ENCOUNTER — Ambulatory Visit: Payer: Medicare HMO | Admitting: Internal Medicine

## 2023-09-07 ENCOUNTER — Ambulatory Visit: Admitting: Internal Medicine

## 2023-09-13 ENCOUNTER — Ambulatory Visit: Admitting: Internal Medicine

## 2023-09-20 ENCOUNTER — Ambulatory Visit: Admitting: Internal Medicine

## 2023-09-22 ENCOUNTER — Ambulatory Visit (INDEPENDENT_AMBULATORY_CARE_PROVIDER_SITE_OTHER): Admitting: Internal Medicine

## 2023-09-22 ENCOUNTER — Encounter: Payer: Self-pay | Admitting: Internal Medicine

## 2023-09-22 ENCOUNTER — Telehealth: Payer: Self-pay | Admitting: Orthopedic Surgery

## 2023-09-22 VITALS — BP 118/80 | HR 87 | Temp 98.1°F | Ht 69.5 in | Wt 187.2 lb

## 2023-09-22 DIAGNOSIS — E538 Deficiency of other specified B group vitamins: Secondary | ICD-10-CM | POA: Diagnosis not present

## 2023-09-22 DIAGNOSIS — I10 Essential (primary) hypertension: Secondary | ICD-10-CM | POA: Diagnosis not present

## 2023-09-22 DIAGNOSIS — E1165 Type 2 diabetes mellitus with hyperglycemia: Secondary | ICD-10-CM | POA: Diagnosis not present

## 2023-09-22 DIAGNOSIS — Z0001 Encounter for general adult medical examination with abnormal findings: Secondary | ICD-10-CM | POA: Diagnosis not present

## 2023-09-22 DIAGNOSIS — E785 Hyperlipidemia, unspecified: Secondary | ICD-10-CM | POA: Diagnosis not present

## 2023-09-22 DIAGNOSIS — Z7984 Long term (current) use of oral hypoglycemic drugs: Secondary | ICD-10-CM

## 2023-09-22 DIAGNOSIS — M5441 Lumbago with sciatica, right side: Secondary | ICD-10-CM

## 2023-09-22 DIAGNOSIS — Z23 Encounter for immunization: Secondary | ICD-10-CM

## 2023-09-22 DIAGNOSIS — M5442 Lumbago with sciatica, left side: Secondary | ICD-10-CM

## 2023-09-22 DIAGNOSIS — Z125 Encounter for screening for malignant neoplasm of prostate: Secondary | ICD-10-CM | POA: Diagnosis not present

## 2023-09-22 DIAGNOSIS — G8929 Other chronic pain: Secondary | ICD-10-CM

## 2023-09-22 DIAGNOSIS — E559 Vitamin D deficiency, unspecified: Secondary | ICD-10-CM | POA: Diagnosis not present

## 2023-09-22 LAB — URINALYSIS, ROUTINE W REFLEX MICROSCOPIC
Bilirubin Urine: NEGATIVE
Hgb urine dipstick: NEGATIVE
Ketones, ur: NEGATIVE
Leukocytes,Ua: NEGATIVE
Nitrite: NEGATIVE
RBC / HPF: NONE SEEN (ref 0–?)
Specific Gravity, Urine: 1.015 (ref 1.000–1.030)
Total Protein, Urine: NEGATIVE
Urine Glucose: NEGATIVE
Urobilinogen, UA: 0.2 (ref 0.0–1.0)
pH: 7 (ref 5.0–8.0)

## 2023-09-22 LAB — CBC WITH DIFFERENTIAL/PLATELET
Basophils Absolute: 0.1 K/uL (ref 0.0–0.1)
Basophils Relative: 0.6 % (ref 0.0–3.0)
Eosinophils Absolute: 0.5 K/uL (ref 0.0–0.7)
Eosinophils Relative: 4.1 % (ref 0.0–5.0)
HCT: 47.2 % (ref 39.0–52.0)
Hemoglobin: 16.1 g/dL (ref 13.0–17.0)
Lymphocytes Relative: 29.6 % (ref 12.0–46.0)
Lymphs Abs: 3.8 K/uL (ref 0.7–4.0)
MCHC: 34.2 g/dL (ref 30.0–36.0)
MCV: 95.1 fl (ref 78.0–100.0)
Monocytes Absolute: 0.8 K/uL (ref 0.1–1.0)
Monocytes Relative: 5.9 % (ref 3.0–12.0)
Neutro Abs: 7.7 K/uL (ref 1.4–7.7)
Neutrophils Relative %: 59.8 % (ref 43.0–77.0)
Platelets: 235 K/uL (ref 150.0–400.0)
RBC: 4.96 Mil/uL (ref 4.22–5.81)
RDW: 12.5 % (ref 11.5–15.5)
WBC: 12.8 K/uL — ABNORMAL HIGH (ref 4.0–10.5)

## 2023-09-22 LAB — BASIC METABOLIC PANEL WITH GFR
BUN: 13 mg/dL (ref 6–23)
CO2: 31 meq/L (ref 19–32)
Calcium: 9.5 mg/dL (ref 8.4–10.5)
Chloride: 103 meq/L (ref 96–112)
Creatinine, Ser: 0.92 mg/dL (ref 0.40–1.50)
GFR: 87.31 mL/min (ref >60.00)
Glucose, Bld: 102 mg/dL — ABNORMAL HIGH (ref 70–99)
Potassium: 4.1 meq/L (ref 3.5–5.1)
Sodium: 141 meq/L (ref 135–145)

## 2023-09-22 LAB — HEMOGLOBIN A1C: Hgb A1c MFr Bld: 7.3 % — ABNORMAL HIGH (ref 4.6–6.5)

## 2023-09-22 LAB — TSH: TSH: 1.39 u[IU]/mL (ref 0.35–5.50)

## 2023-09-22 LAB — HEPATIC FUNCTION PANEL
ALT: 20 U/L (ref 0–53)
AST: 16 U/L (ref 0–37)
Albumin: 4.7 g/dL (ref 3.5–5.2)
Alkaline Phosphatase: 65 U/L (ref 39–117)
Bilirubin, Direct: 0.1 mg/dL (ref 0.0–0.3)
Total Bilirubin: 0.7 mg/dL (ref 0.2–1.2)
Total Protein: 6.7 g/dL (ref 6.0–8.3)

## 2023-09-22 LAB — MICROALBUMIN / CREATININE URINE RATIO
Creatinine,U: 98.1 mg/dL
Microalb Creat Ratio: UNDETERMINED mg/g (ref 0.0–30.0)
Microalb, Ur: <0.7 mg/dL

## 2023-09-22 LAB — LIPID PANEL
Cholesterol: 155 mg/dL (ref 0–200)
HDL: 36.3 mg/dL — ABNORMAL LOW (ref >39.00)
NonHDL: 118.6
Total CHOL/HDL Ratio: 4
Triglycerides: 483 mg/dL — ABNORMAL HIGH (ref 0.0–149.0)
VLDL: 96.6 mg/dL — ABNORMAL HIGH (ref 0.0–40.0)

## 2023-09-22 LAB — VITAMIN D 25 HYDROXY (VIT D DEFICIENCY, FRACTURES): VITD: 12.49 ng/mL — ABNORMAL LOW (ref 30.00–100.00)

## 2023-09-22 LAB — VITAMIN B12: Vitamin B-12: 204 pg/mL — ABNORMAL LOW (ref 211–911)

## 2023-09-22 LAB — PSA: PSA: 1.37 ng/mL (ref 0.10–4.00)

## 2023-09-22 LAB — LDL CHOLESTEROL, DIRECT: Direct LDL: 69 mg/dL

## 2023-09-22 MED ORDER — GABAPENTIN 300 MG PO CAPS: 300.0000 mg | ORAL_CAPSULE | Freq: Three times a day (TID) | ORAL | 5 refills | Status: AC

## 2023-09-22 MED ORDER — CITALOPRAM HYDROBROMIDE 20 MG PO TABS: 20.0000 mg | ORAL_TABLET | Freq: Every day | ORAL | 3 refills | Status: DC

## 2023-09-22 MED ORDER — ATORVASTATIN CALCIUM 20 MG PO TABS: 20.0000 mg | ORAL_TABLET | Freq: Every day | ORAL | 3 refills | Status: DC

## 2023-09-22 MED ORDER — NORTRIPTYLINE HCL 50 MG PO CAPS: 50.0000 mg | ORAL_CAPSULE | Freq: Three times a day (TID) | ORAL | 1 refills | Status: AC

## 2023-09-22 MED ORDER — TAMSULOSIN HCL 0.4 MG PO CAPS: 0.4000 mg | ORAL_CAPSULE | Freq: Every day | ORAL | 3 refills | Status: DC

## 2023-09-22 MED ORDER — HYDROCODONE-ACETAMINOPHEN 7.5-325 MG PO TABS: 1.0000 | ORAL_TABLET | Freq: Four times a day (QID) | ORAL | 0 refills | Status: AC | PRN

## 2023-09-22 MED ORDER — LOSARTAN POTASSIUM 100 MG PO TABS: 100.0000 mg | ORAL_TABLET | Freq: Every day | ORAL | 3 refills | Status: DC

## 2023-09-22 MED ORDER — GLIPIZIDE ER 10 MG PO TB24: ORAL_TABLET | ORAL | 3 refills | Status: DC

## 2023-09-22 MED ORDER — TIZANIDINE HCL 4 MG PO TABS: ORAL_TABLET | ORAL | 10 refills | Status: AC

## 2023-09-22 MED ORDER — METFORMIN HCL 500 MG PO TABS: ORAL_TABLET | ORAL | 3 refills | Status: DC

## 2023-09-22 MED ORDER — HYDROCHLOROTHIAZIDE 25 MG PO TABS: 25.0000 mg | ORAL_TABLET | Freq: Every day | ORAL | 3 refills | Status: DC

## 2023-09-22 MED ORDER — TRELEGY ELLIPTA 100-62.5-25 MCG/ACT IN AEPB: 1.0000 | INHALATION_SPRAY | Freq: Every day | RESPIRATORY_TRACT | 3 refills | Status: AC

## 2023-09-22 MED ORDER — PREDNISONE 10 MG PO TABS: ORAL_TABLET | ORAL | 0 refills | Status: AC

## 2023-09-22 NOTE — Progress Notes (Signed)
 Patient ID: Ethan Ellis, male   DOB: [birth year], 62 y.o.   MRN: [id number]         Chief Complaint:: wellness exam and Medical Management of Chronic Issues (6 Month follow up. Medication management, labs, diabetes management. Patient still having issues with pinched nerve/back pain but they are having this handled through ortho)  , chronic lbp with bilateral sciatica, dm, dyslipidemia, htn, low vit d       HPI:  Ethan Ellis is a 62 y.o. male here for wellness exam; for prevnar 20 today, up to date                        Also Pt continues to have recurring LBP with worsening bilateral sciatica in the past week, but no bowel or bladder change, fever, wt loss,  worsening LE numbness/weakness, gait change or falls  Pt denies chest pain, increased sob or doe, wheezing, orthopnea, PND, increased LE swelling, palpitations, dizziness or syncope.   Pt denies polydipsia, polyuria, or new focal neuro s/s.    Pt denies fever, wt loss, night sweats, loss of appetite, or other constitutional symptoms    Wt Readings from Last 3 Encounters:  09/22/23 187 lb 3.2 oz (84.9 kg)  06/27/23 195 lb (88.5 kg)  06/01/23 195 lb (88.5 kg)   BP Readings from Last 3 Encounters:  09/22/23 118/80  07/07/23 (!) 150/84  06/27/23 (!) 146/84   Immunization History  Administered Date(s) Administered   Influenza,inj,Quad PF,6+ Mos 02/22/2014, 04/01/2015, 11/23/2016, 11/24/2017, 12/06/2018   Influenza-Unspecified 04/15/2016, 11/23/2021, 01/17/2023   PNEUMOCOCCAL CONJUGATE-20 09/22/2023   Pneumococcal Conjugate-13 04/22/2016   Pneumococcal Polysaccharide-23 05/04/2016   RSV,unspecified 11/23/2021   Tdap 09/14/2016   Zoster Recombinant(Shingrix) 06/18/2017, 08/19/2017   There are no preventive care reminders to display for this patient.     Past Medical History:  Diagnosis Date   Depression    Diabetes mellitus without complication (HCC)    type 2   History of kidney stones    passed stone - no surgery    Hyperlipidemia 03/10/2014   Hypertension    LBP (low back pain) 2009   Right   Unspecified viral hepatitis C without hepatic coma 04/04/2015   Past Surgical History:  Procedure Laterality Date   APPENDECTOMY     BACK SURGERY     lower back    COLONOSCOPY  12/06/2014   Dr.Stark   INGUINAL HERNIA REPAIR Left 04/26/2019   Procedure: LEFT INGUINAL HERNIA REPAIR WITH MESH;  Surgeon: Belinda Cough, MD;  Location: [facility];  Service: General;  Laterality: Left;  LMA AND TAP BLOCK   LESION REMOVAL Left 04/26/2019   Procedure: EXCISION OF SKIN LESION LEFT GROIN AND BASE OF PENIS;  Surgeon: Belinda Cough, MD;  Location: [facility];  Service: General;  Laterality: Left;   REVERSE SHOULDER ARTHROPLASTY Left 12/25/2020   Procedure: LEFT REVERSE SHOULDER REPLACEMENT;  Surgeon: Addie Cordella Hamilton, MD;  Location: [facility];  Service: Orthopedics;  Laterality: Left;   SHOULDER SURGERY     right - x 3 surgery    reports that he has been smoking cigarettes. He has a 23 pack-year smoking history. He has been exposed to tobacco smoke. He has never used smokeless tobacco. He reports that he does not currently use drugs. He reports that he does not drink alcohol. family history includes Diabetes in his father. Allergies  Allergen Reactions   Naproxen Swelling and Other (See Comments)  Mouth swelling- patient can take advil and some other NSAIDs w/o problems, however   Current Outpatient Medications on File Prior to Visit  Medication Sig Dispense Refill   acetaminophen  (TYLENOL ) 650 MG CR tablet Take 1,950 mg by mouth 2 (two) times daily.     albuterol  (VENTOLIN  HFA) 108 (90 Base) MCG/ACT inhaler Inhale 2 puffs into the lungs every 6 (six) hours as needed for wheezing or shortness of breath. 54 g 3   aspirin  EC 81 MG tablet Take 1 tablet (81 mg total) by mouth daily. 90 tablet 11   Blood Glucose Calibration (ACCU-CHEK GUIDE CONTROL) LIQD Use as directed once daily e11.9 1  each 5   Blood Glucose Monitoring Suppl (ACCU-CHEK GUIDE ME) w/Device KIT Use as directed once daily E11.9 1 kit 0   Cholecalciferol (VITAMIN D -3 PO) Take 1 capsule by mouth daily.     clotrimazole -betamethasone  (LOTRISONE ) cream Use as directed to affected area twice daily as needed 15 g 1   glucose blood (ACCU-CHEK GUIDE) test strip Use as instructed once daily E11.9 100 each 12   Lancets MISC Use as directed once daily to check blood sugar.  Diagnosis code E11.9 100 each 11   No current facility-administered medications on file prior to visit.        ROS:  All others reviewed and negative.  Objective        PE:  BP 118/80   Pulse 87   Temp 98.1 F (36.7 C)   Ht 5' 9.5 (1.765 m)   Wt 187 lb 3.2 oz (84.9 kg)   SpO2 95%   BMI 27.25 kg/m                 Constitutional: Pt appears in NAD               HENT: Head: NCAT.                Right Ear: External ear normal.                 Left Ear: External ear normal.                Eyes: . Pupils are equal, round, and reactive to light. Conjunctivae and EOM are normal               Nose: without d/c or deformity               Neck: Neck supple. Gross normal ROM               Cardiovascular: Normal rate and regular rhythm.                 Pulmonary/Chest: Effort normal and breath sounds without rales or wheezing.                Abd:  Soft, NT, ND, + BS, no organomegaly               Neurological: Pt is alert. At baseline orientation, motor grossly intact               Skin: Skin is warm. No rashes, no other new lesions, LE edema - none               Psychiatric: Pt behavior is normal without agitation   Micro: none  Cardiac tracings I have personally interpreted today:  none  Pertinent Radiological findings (summarize): none   Lab Results  Component Value Date  WBC 12.8 (H) 09/22/2023   HGB 16.1 09/22/2023   HCT 47.2 09/22/2023   PLT 235.0 09/22/2023   GLUCOSE 102 (H) 09/22/2023   CHOL 155 09/22/2023   TRIG (H) 09/22/2023     483.0 Triglyceride is over 400; calculations on Lipids are invalid.   HDL 36.30 (L) 09/22/2023   LDLDIRECT 69.0 09/22/2023   LDLCALC 50 03/07/2023   ALT 20 09/22/2023   AST 16 09/22/2023   NA 141 09/22/2023   K 4.1 09/22/2023   CL 103 09/22/2023   CREATININE 0.92 09/22/2023   BUN 13 09/22/2023   CO2 31 09/22/2023   TSH 1.39 09/22/2023   PSA 1.37 09/22/2023   HGBA1C 7.3 (H) 09/22/2023   MICROALBUR <0.7 09/22/2023   Assessment/Plan:  Ethan Ellis is a 63 y.o. White or Caucasian [1] male with  has a past medical history of Depression, Diabetes mellitus without complication (HCC), History of kidney stones, Hyperlipidemia (03/10/2014), Hypertension, LBP (low back pain) (2009), and Unspecified viral hepatitis C without hepatic coma (04/04/2015).  Encounter for well adult exam with abnormal findings Age and sex appropriate education and counseling updated with regular exercise and diet Referrals for preventative services - none needed Immunizations addressed - for prevnar 20 Smoking counseling  - none needed Evidence for depression or other mood disorder - none significant Most recent labs reviewed. I have personally reviewed and have noted: 1) the patient's medical and social history 2) The patient's current medications and supplements 3) The patient's height, weight, and BMI have been recorded in the chart   Vitamin D  deficiency Last vitamin D  Lab Results  Component Value Date   VD25OH 12.49 (L) 09/22/2023   Low, to start oral replacement   Uncontrolled hypertension BP Readings from Last 3 Encounters:  09/22/23 118/80  07/07/23 (!) 150/84  06/27/23 (!) 146/84   Stable, pt to continue medical treatment hct 25 every day, losartan  100 qd   Hyperlipidemia Lab Results  Component Value Date   CHOL 155 09/22/2023   CHOL 146 03/07/2023   CHOL 152 08/30/2022   Lab Results  Component Value Date   HDL 36.30 (L) 09/22/2023   HDL 35.50 (L) 03/07/2023   HDL 35.30 (L)  08/30/2022   Lab Results  Component Value Date   LDLCALC 50 03/07/2023   LDLCALC 72 02/03/2022   LDLCALC 71 09/08/2021   Lab Results  Component Value Date   TRIG (H) 09/22/2023    483.0 Triglyceride is over 400; calculations on Lipids are invalid.   TRIG 300.0 (H) 03/07/2023   TRIG 379.0 (H) 08/30/2022   Lab Results  Component Value Date   CHOLHDL 4 09/22/2023   CHOLHDL 4 03/07/2023   CHOLHDL 4 08/30/2022   Lab Results  Component Value Date   LDLDIRECT 69.0 09/22/2023   LDLDIRECT 82.0 08/30/2022   LDLDIRECT 91.0 07/14/2020   LDL in range, but with worsening hypertriglyceridemia, pt to continue current statin lipitor 20 mg every day, add fenofibrate  145 mg qd   Diabetes (HCC) Lab Results  Component Value Date   HGBA1C 7.3 (H) 09/22/2023   uncontrolled, pt to increase the metformin  ER 500 mg to 2 tabs bid   Chronic low back pain With recent flare uncontrolled pain, for prednisone  taper, refill tizanidine  prn, and increase gabapentin  300 tid, f/u surgury as planned  Followup: Return in about 6 months (around 03/24/2024).  Lynwood Rush, MD 09/23/2023 7:44 PM [facility]

## 2023-09-22 NOTE — Patient Instructions (Signed)
 You had the Prevnar 20 pneumonia shot today  Please take all new medication as prescribed- the gabapentin  300 mg, and prednisone   Please continue all other medications as before, and refills have been done for the hydrocodone  and muscle relaxer  Please have the pharmacy call with any other refills you may need.  Please continue your efforts at being more active, low cholesterol diet, and weight control.  You are otherwise up to date with prevention measures today.  Please keep your appointments with your specialists as you may have planned  Please go to the LAB at the blood drawing area for the tests to be done  You will be contacted by phone if any changes need to be made immediately.  Otherwise, you will receive a letter about your results with an explanation, but please check with MyChart first.  Please make an Appointment to return in 6 months, or sooner if needed

## 2023-09-22 NOTE — Telephone Encounter (Signed)
 Pt request an appointment for another back injection from [facility]

## 2023-09-23 ENCOUNTER — Ambulatory Visit: Payer: Self-pay | Admitting: Internal Medicine

## 2023-09-23 ENCOUNTER — Encounter: Payer: Self-pay | Admitting: Internal Medicine

## 2023-09-23 ENCOUNTER — Other Ambulatory Visit: Payer: Self-pay | Admitting: Internal Medicine

## 2023-09-23 MED ORDER — FENOFIBRATE 145 MG PO TABS: 145.0000 mg | ORAL_TABLET | Freq: Every day | ORAL | 3 refills | Status: AC

## 2023-09-23 MED ORDER — METFORMIN HCL 500 MG PO TABS: ORAL_TABLET | ORAL | 3 refills | Status: DC

## 2023-09-23 NOTE — Assessment & Plan Note (Signed)
 Lab Results  Component Value Date   HGBA1C 7.3 (H) 09/22/2023   uncontrolled, pt to increase the metformin  ER 500 mg to 2 tabs bid

## 2023-09-23 NOTE — Assessment & Plan Note (Addendum)
 Age and sex appropriate education and counseling updated with regular exercise and diet Referrals for preventative services - none needed Immunizations addressed - for prevnar 20 Smoking counseling  - none needed Evidence for depression or other mood disorder - none significant Most recent labs reviewed. I have personally reviewed and have noted: 1) the patient's medical and social history 2) The patient's current medications and supplements 3) The patient's height, weight, and BMI have been recorded in the chart

## 2023-09-23 NOTE — Assessment & Plan Note (Signed)
 BP Readings from Last 3 Encounters:  09/22/23 118/80  07/07/23 (!) 150/84  06/27/23 (!) 146/84   Stable, pt to continue medical treatment hct 25 every day, losartan  100 qd

## 2023-09-23 NOTE — Assessment & Plan Note (Signed)
 With recent flare uncontrolled pain, for prednisone  taper, refill tizanidine  prn, and increase gabapentin  300 tid, f/u surgury as planned

## 2023-09-23 NOTE — Assessment & Plan Note (Signed)
 Last vitamin D  Lab Results  Component Value Date   VD25OH 12.49 (L) 09/22/2023   Low, to start oral replacement

## 2023-09-23 NOTE — Assessment & Plan Note (Signed)
 Lab Results  Component Value Date   CHOL 155 09/22/2023   CHOL 146 03/07/2023   CHOL 152 08/30/2022   Lab Results  Component Value Date   HDL 36.30 (L) 09/22/2023   HDL 35.50 (L) 03/07/2023   HDL 35.30 (L) 08/30/2022   Lab Results  Component Value Date   LDLCALC 50 03/07/2023   LDLCALC 72 02/03/2022   LDLCALC 71 09/08/2021   Lab Results  Component Value Date   TRIG (H) 09/22/2023    483.0 Triglyceride is over 400; calculations on Lipids are invalid.   TRIG 300.0 (H) 03/07/2023   TRIG 379.0 (H) 08/30/2022   Lab Results  Component Value Date   CHOLHDL 4 09/22/2023   CHOLHDL 4 03/07/2023   CHOLHDL 4 08/30/2022   Lab Results  Component Value Date   LDLDIRECT 69.0 09/22/2023   LDLDIRECT 82.0 08/30/2022   LDLDIRECT 91.0 07/14/2020   LDL in range, but with worsening hypertriglyceridemia, pt to continue current statin lipitor 20 mg every day, add fenofibrate  145 mg qd

## 2023-09-27 ENCOUNTER — Telehealth: Payer: Self-pay | Admitting: Internal Medicine

## 2023-09-27 MED ORDER — BREZTRI AEROSPHERE 160-9-4.8 MCG/ACT IN AERO: 2.0000 | INHALATION_SPRAY | Freq: Two times a day (BID) | RESPIRATORY_TRACT | 11 refills | Status: AC

## 2023-09-27 NOTE — Telephone Encounter (Signed)
 Ok I have sent the changed script to [facility]  to see if this is more affordable  I can refer to in office pharmacist if this conts to be a problem

## 2023-09-27 NOTE — Telephone Encounter (Unsigned)
 Copied from CRM [id number]. Topic: Clinical - Prescription Issue >> Sep 27, 2023  8:58 AM Franky GRADE wrote: Reason for CRM: Robin from [facility] pharmacy is calling to see if there is an alternative medication for Fluticasone-Umeclidin-Vilant (TRELEGY ELLIPTA ) 100-62.5-25 MCG/ACT AEPB [[id number]]. Patient has a high deductible and it will be a out of pocket cost of $300 and patient cannot afford that. Best call back number [phone].

## 2023-09-28 NOTE — Telephone Encounter (Signed)
 Letter printed and mailed.

## 2023-09-28 NOTE — Telephone Encounter (Signed)
Attempted to call patient and was not able to leave a voice message.

## 2023-09-29 IMAGING — DX DG SHOULDER 1V*L*
1 series · 1 of 1 positions shown · non-contrast
Comparison: None.

CLINICAL DATA: Status post left total shoulder arthroplasty.

EXAM:
LEFT SHOULDER

[shoulder]
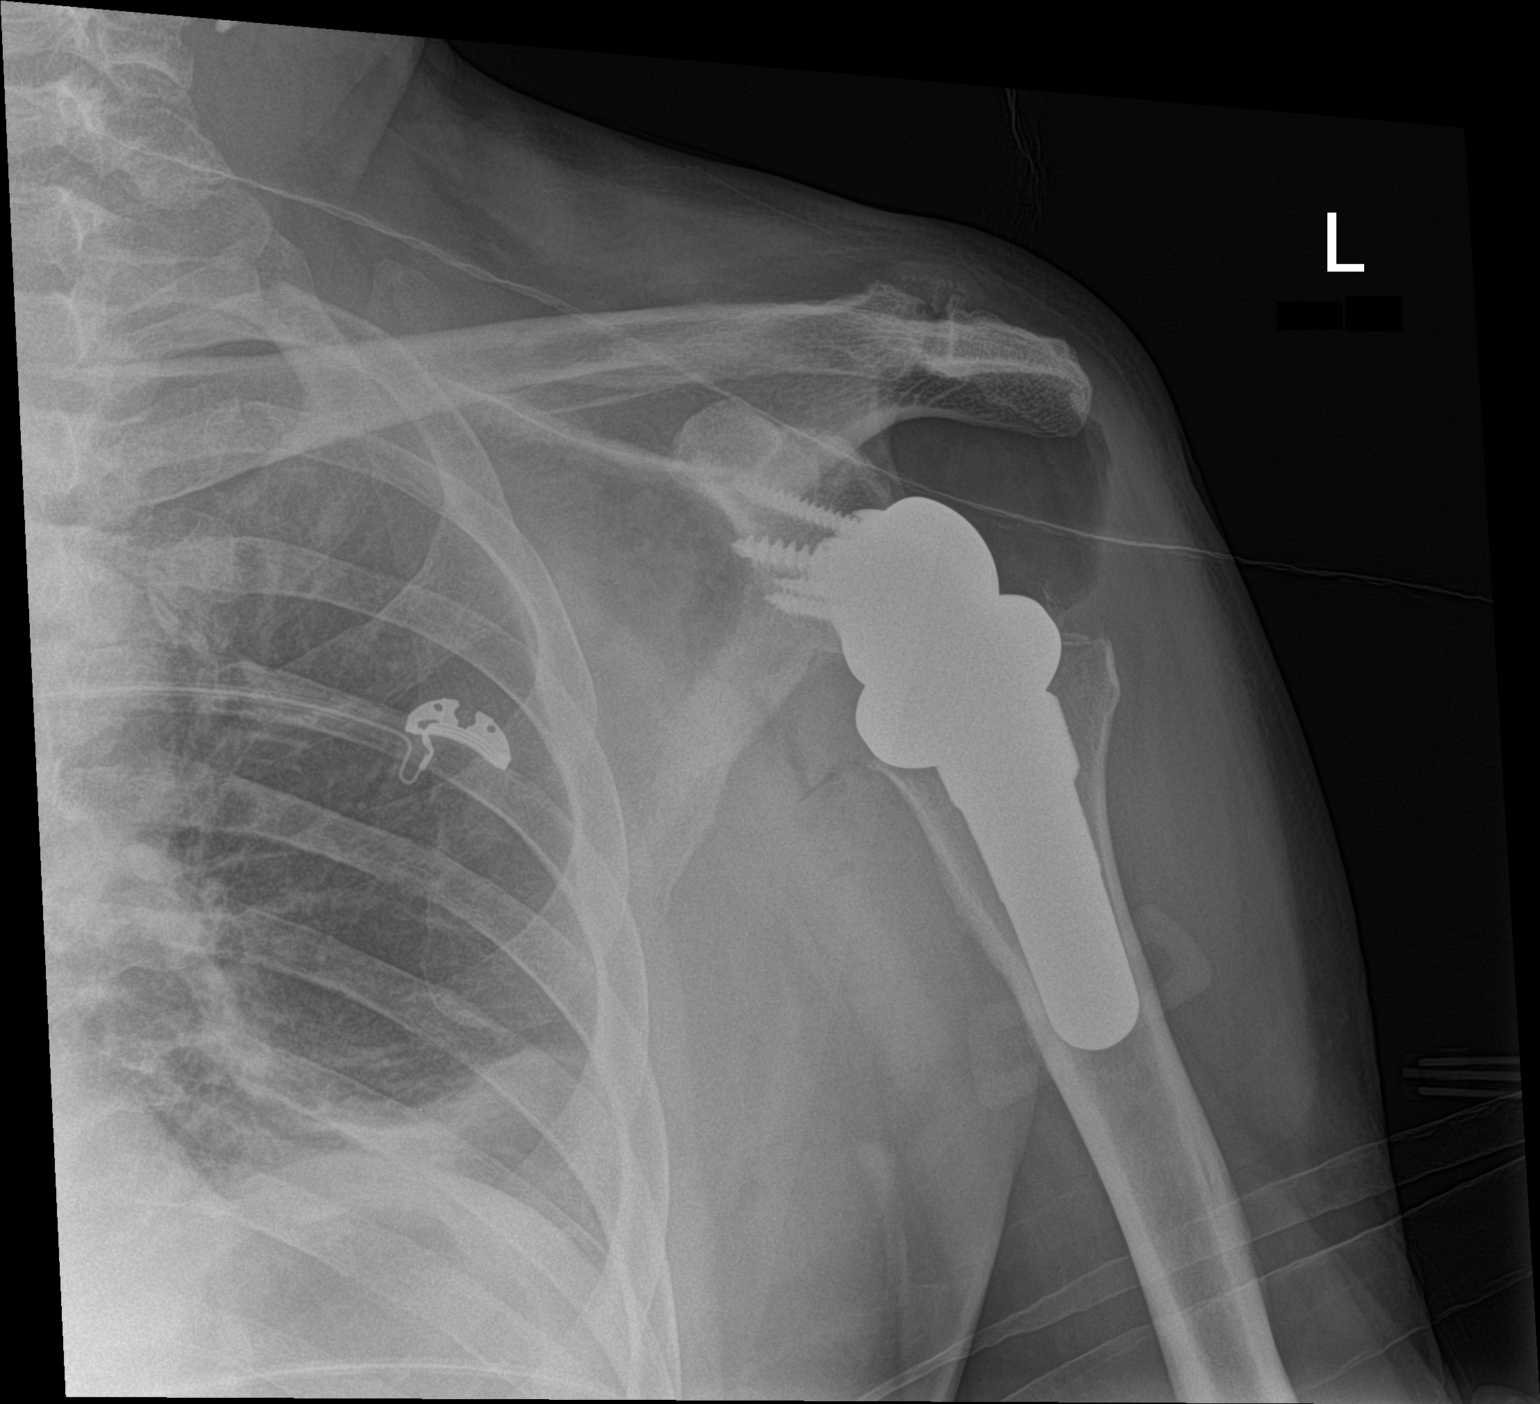

[1 of 1 positions shown; findings below may reference images not displayed]

FINDINGS: Single view radiograph the left shoulder demonstrates surgical
changes of reverse total shoulder arthroplasty. Intra-articular gas
is in keeping with recent surgical intervention. Mild degenerative
changes are seen at the acromioclavicular articulation. No
unexpected fracture or dislocation. Limited evaluation of the left
hemithorax demonstrates mild left basilar atelectasis.
IMPRESSION: Status post left reversed total shoulder arthroplasty. No unexpected
fracture or dislocation.

## 2023-10-05 ENCOUNTER — Other Ambulatory Visit: Payer: Self-pay | Admitting: Internal Medicine

## 2023-10-20 ENCOUNTER — Encounter: Payer: Self-pay | Admitting: Internal Medicine

## 2023-10-20 DIAGNOSIS — H35363 Drusen (degenerative) of macula, bilateral: Secondary | ICD-10-CM | POA: Diagnosis not present

## 2023-10-20 DIAGNOSIS — E119 Type 2 diabetes mellitus without complications: Secondary | ICD-10-CM | POA: Diagnosis not present

## 2023-10-20 DIAGNOSIS — H2513 Age-related nuclear cataract, bilateral: Secondary | ICD-10-CM | POA: Diagnosis not present

## 2023-10-20 LAB — HM DIABETES EYE EXAM

## 2023-11-22 ENCOUNTER — Telehealth: Payer: Self-pay | Admitting: Physical Medicine and Rehabilitation

## 2023-11-22 NOTE — Telephone Encounter (Signed)
 Patient called and said he wants to get an appointment to schedule for an injection for the sciatica. CB#[phone]

## 2024-01-08 ENCOUNTER — Other Ambulatory Visit: Payer: Self-pay | Admitting: Internal Medicine

## 2024-01-09 ENCOUNTER — Other Ambulatory Visit: Payer: Self-pay

## 2024-01-10 DIAGNOSIS — Z8249 Family history of ischemic heart disease and other diseases of the circulatory system: Secondary | ICD-10-CM | POA: Diagnosis not present

## 2024-01-10 DIAGNOSIS — F17210 Nicotine dependence, cigarettes, uncomplicated: Secondary | ICD-10-CM | POA: Diagnosis not present

## 2024-01-10 DIAGNOSIS — J449 Chronic obstructive pulmonary disease, unspecified: Secondary | ICD-10-CM | POA: Diagnosis not present

## 2024-01-10 DIAGNOSIS — Z7951 Long term (current) use of inhaled steroids: Secondary | ICD-10-CM | POA: Diagnosis not present

## 2024-01-10 DIAGNOSIS — E1142 Type 2 diabetes mellitus with diabetic polyneuropathy: Secondary | ICD-10-CM | POA: Diagnosis not present

## 2024-01-10 DIAGNOSIS — M545 Low back pain, unspecified: Secondary | ICD-10-CM | POA: Diagnosis not present

## 2024-01-10 DIAGNOSIS — Z886 Allergy status to analgesic agent status: Secondary | ICD-10-CM | POA: Diagnosis not present

## 2024-01-10 DIAGNOSIS — F320 Major depressive disorder, single episode, mild: Secondary | ICD-10-CM | POA: Diagnosis not present

## 2024-01-10 DIAGNOSIS — I2510 Atherosclerotic heart disease of native coronary artery without angina pectoris: Secondary | ICD-10-CM | POA: Diagnosis not present

## 2024-01-10 DIAGNOSIS — E1151 Type 2 diabetes mellitus with diabetic peripheral angiopathy without gangrene: Secondary | ICD-10-CM | POA: Diagnosis not present

## 2024-01-10 DIAGNOSIS — I10 Essential (primary) hypertension: Secondary | ICD-10-CM | POA: Diagnosis not present

## 2024-01-10 DIAGNOSIS — Z59861 Financial insecurity, difficulty paying for utilities: Secondary | ICD-10-CM | POA: Diagnosis not present

## 2024-01-10 DIAGNOSIS — M4800 Spinal stenosis, site unspecified: Secondary | ICD-10-CM | POA: Diagnosis not present

## 2024-01-10 DIAGNOSIS — M1990 Unspecified osteoarthritis, unspecified site: Secondary | ICD-10-CM | POA: Diagnosis not present

## 2024-01-10 DIAGNOSIS — N400 Enlarged prostate without lower urinary tract symptoms: Secondary | ICD-10-CM | POA: Diagnosis not present

## 2024-01-10 DIAGNOSIS — E785 Hyperlipidemia, unspecified: Secondary | ICD-10-CM | POA: Diagnosis not present

## 2024-01-16 ENCOUNTER — Telehealth: Payer: Self-pay | Admitting: Physical Medicine and Rehabilitation

## 2024-01-16 NOTE — Telephone Encounter (Signed)
Patient called. He would like an appointment with Dr. Newton.  

## 2024-01-18 ENCOUNTER — Telehealth: Payer: Self-pay | Admitting: Physical Medicine and Rehabilitation

## 2024-01-18 NOTE — Telephone Encounter (Signed)
 Patient called and needs a f/u. He stated that he never gotten a call back. CB#[phone]

## 2024-01-25 ENCOUNTER — Ambulatory Visit: Admitting: Physical Medicine and Rehabilitation

## 2024-02-03 ENCOUNTER — Encounter: Payer: Self-pay | Admitting: Physical Medicine and Rehabilitation

## 2024-02-03 ENCOUNTER — Telehealth: Payer: Self-pay

## 2024-02-03 ENCOUNTER — Ambulatory Visit: Admitting: Physical Medicine and Rehabilitation

## 2024-02-03 DIAGNOSIS — M48062 Spinal stenosis, lumbar region with neurogenic claudication: Secondary | ICD-10-CM | POA: Diagnosis not present

## 2024-02-03 DIAGNOSIS — M5416 Radiculopathy, lumbar region: Secondary | ICD-10-CM

## 2024-02-03 DIAGNOSIS — G8929 Other chronic pain: Secondary | ICD-10-CM | POA: Diagnosis not present

## 2024-02-03 DIAGNOSIS — M5441 Lumbago with sciatica, right side: Secondary | ICD-10-CM | POA: Diagnosis not present
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
## 2024-02-03 NOTE — Telephone Encounter (Signed)
 Patient advised he never set up voice mail and that is the reason he thought we didn't call back, all calls have been documented in encounters. Patient advised he didn't want to connect to [facility] however he will answer his call moving forward.

## 2024-02-03 NOTE — Progress Notes (Unsigned)
 Pain Scale   Average Pain 7 Patient advised he has chronic lower back pain radiating to Right leg.         +Driver, -BT, -Dye Allergies.

## 2024-03-02 ENCOUNTER — Ambulatory Visit: Admitting: Physical Medicine and Rehabilitation

## 2024-03-02 ENCOUNTER — Other Ambulatory Visit: Payer: Self-pay

## 2024-03-02 VITALS — BP 153/89 | HR 76

## 2024-03-02 DIAGNOSIS — M5416 Radiculopathy, lumbar region: Secondary | ICD-10-CM

## 2024-03-02 MED ORDER — METHYLPREDNISOLONE ACETATE 40 MG/ML IJ SUSP
40.0000 mg | Freq: Once | INTRAMUSCULAR | Status: AC
  Administered 2024-03-02: 40 mg

## 2024-03-02 NOTE — Progress Notes (Unsigned)
 Pain Scale   Average Pain 8 Patient advising he has lower back pain that radiates to both legs pain is constant.        +Driver, -BT, -Dye Allergies.

## 2024-03-05 NOTE — Procedures (Signed)
 Lumbosacral Transforaminal Epidural Steroid Injection - Sub-Pedicular Approach with Fluoroscopic Guidance  Patient: Ethan Ellis      Date of Birth: Mar 29, 1960 MRN: [id number] PCP: Norleen Lynwood ORN, MD      Visit Date: 03/02/2024   Universal Protocol:    Date/Time: 03/02/2024  Consent Given By: the patient  Position: PRONE  Additional Comments: Vital signs were monitored before and after the procedure. Patient was prepped and draped in the usual sterile fashion. The correct patient, procedure, and site was verified.   Injection Procedure Details:   Procedure diagnoses: Lumbar radiculopathy [M54.16]    Meds Administered:  Meds ordered this encounter  Medications   methylPREDNISolone  acetate (DEPO-MEDROL ) injection 40 mg    Laterality: Right  Location/Site: L4  Needle:5.0 in., 22 ga.  Short bevel or Quincke spinal needle  Needle Placement: Transforaminal  Findings:    -Comments: Excellent flow of contrast along the nerve, nerve root and into the epidural space.  Procedure Details: After squaring off the end-plates to get a true AP view, the C-arm was positioned so that an oblique view of the foramen as noted above was visualized. The target area is just inferior to the nose of the scotty dog or sub pedicular. The soft tissues overlying this structure were infiltrated with 2-3 ml. of 1% Lidocaine  without Epinephrine .  The spinal needle was inserted toward the target using a trajectory view along the fluoroscope beam.  Under AP and lateral visualization, the needle was advanced so it did not puncture dura and was located close the 6 O'Clock position of the pedical in AP tracterory. Biplanar projections were used to confirm position. Aspiration was confirmed to be negative for CSF and/or blood. A 1-2 ml. volume of Isovue -250 was injected and flow of contrast was noted at each level. Radiographs were obtained for documentation purposes.   After attaining the desired flow of  contrast documented above, a 0.5 to 1.0 ml test dose of 0.25% Marcaine  was injected into each respective transforaminal space.  The patient was observed for 90 seconds post injection.  After no sensory deficits were reported, and normal lower extremity motor function was noted,   the above injectate was administered so that equal amounts of the injectate were placed at each foramen (level) into the transforaminal epidural space.   Additional Comments:  The patient tolerated the procedure well Dressing: 2 x 2 sterile gauze and Band-Aid    Post-procedure details: Patient was observed during the procedure. Post-procedure instructions were reviewed.  Patient left the clinic in stable condition.

## 2024-03-05 NOTE — Progress Notes (Signed)
 "  Ethan Ellis - 63 y.o. male MRN [id number]  Date of birth: 08-Oct-1960  Office Visit Note: Visit Date: 03/02/2024 PCP: Norleen Lynwood ORN, MD Referred by: Norleen Lynwood ORN, MD  Subjective: Chief Complaint  Patient presents with   Lower Back - Pain   HPI:  Ethan Ellis is a 63 y.o. male who comes in today at the request of Duwaine Pouch, FNP for planned Right L4-5 Lumbar Transforaminal epidural steroid injection with fluoroscopic guidance.  The patient has failed conservative care including home exercise, medications, time and activity modification.  This injection will be diagnostic and hopefully therapeutic.  Please see requesting physician notes for further details and justification.   ROS Otherwise per HPI.  Assessment & Plan: Visit Diagnoses:    ICD-10-CM   1. Lumbar radiculopathy  M54.16 XR C-ARM NO REPORT    Epidural Steroid injection    methylPREDNISolone  acetate (DEPO-MEDROL ) injection 40 mg      Plan: No additional findings.   Meds & Orders:  Meds ordered this encounter  Medications   methylPREDNISolone  acetate (DEPO-MEDROL ) injection 40 mg    Orders Placed This Encounter  Procedures   XR C-ARM NO REPORT   Epidural Steroid injection    Follow-up: Return for visit to requesting provider as needed.   Procedures: No procedures performed  Lumbosacral Transforaminal Epidural Steroid Injection - Sub-Pedicular Approach with Fluoroscopic Guidance  Patient: Ethan Ellis      Date of Birth: January 27, 1961 MRN: [id number] PCP: Norleen Lynwood ORN, MD      Visit Date: 03/02/2024   Universal Protocol:    Date/Time: 03/02/2024  Consent Given By: the patient  Position: PRONE  Additional Comments: Vital signs were monitored before and after the procedure. Patient was prepped and draped in the usual sterile fashion. The correct patient, procedure, and site was verified.   Injection Procedure Details:   Procedure diagnoses: Lumbar radiculopathy [M54.16]    Meds  Administered:  Meds ordered this encounter  Medications   methylPREDNISolone  acetate (DEPO-MEDROL ) injection 40 mg    Laterality: Right  Location/Site: L4  Needle:5.0 in., 22 ga.  Short bevel or Quincke spinal needle  Needle Placement: Transforaminal  Findings:    -Comments: Excellent flow of contrast along the nerve, nerve root and into the epidural space.  Procedure Details: After squaring off the end-plates to get a true AP view, the C-arm was positioned so that an oblique view of the foramen as noted above was visualized. The target area is just inferior to the nose of the scotty dog or sub pedicular. The soft tissues overlying this structure were infiltrated with 2-3 ml. of 1% Lidocaine  without Epinephrine .  The spinal needle was inserted toward the target using a trajectory view along the fluoroscope beam.  Under AP and lateral visualization, the needle was advanced so it did not puncture dura and was located close the 6 O'Clock position of the pedical in AP tracterory. Biplanar projections were used to confirm position. Aspiration was confirmed to be negative for CSF and/or blood. A 1-2 ml. volume of Isovue -250 was injected and flow of contrast was noted at each level. Radiographs were obtained for documentation purposes.   After attaining the desired flow of contrast documented above, a 0.5 to 1.0 ml test dose of 0.25% Marcaine  was injected into each respective transforaminal space.  The patient was observed for 90 seconds post injection.  After no sensory deficits were reported, and normal lower extremity motor function was noted,   the above  injectate was administered so that equal amounts of the injectate were placed at each foramen (level) into the transforaminal epidural space.   Additional Comments:  The patient tolerated the procedure well Dressing: 2 x 2 sterile gauze and Band-Aid    Post-procedure details: Patient was observed during the procedure. Post-procedure  instructions were reviewed.  Patient left the clinic in stable condition.    Clinical History: MRI of the lumbar spine from 06/16/2023 was independently reviewed and interpreted, showing left paracentral disc herniation at L1/2 causing lateral recess stenosis.  Central lateral recess stenosis at L3/4.  Right-sided foraminal, bilateral lateral recess, and central stenosis at L4/5.  Bilateral foraminal stenosis at L5/S1.  DDD at L3/4, L4/5, and L5/S1.  Hemilaminotomy defect on the right at L5/S1  ------- CLINICAL DATA:  Lumbar radiculopathy, right leg weakness and numbness   EXAM: MRI LUMBAR SPINE WITHOUT CONTRAST   TECHNIQUE: Multiplanar, multisequence MR imaging of the lumbar spine was performed. No intravenous contrast was administered.   COMPARISON:  MRI lumbar spine dated 06/02/2011   FINDINGS: Segmentation: Standard.   Alignment:  Physiologic lumbar alignment is maintained.   Vertebrae: Degenerative endplate marrow changes at a few levels. No compression fractures.   Conus medullaris and cauda equina: The conus medullaris terminates at the level of L1-L2. The distal spinal cord signal intensity is normal.   Paraspinal and other soft tissues: The visualized abdomen and pelvis show no soft tissue abnormality. The visualized aorta is normal.   Disc levels:   L1-L2: Disc bulge. Moderate facet arthropathy. Mild bilateral neuroforaminal stenosis. Moderate spinal canal stenosis.   L2-L3: Disc bulge. Severe bilateral facet arthropathy. No neuroforaminal stenosis. Moderate spinal canal stenosis.   L3-L4: Disc bulge. Severe facet arthropathy. Moderate left and mild right neuroforaminal stenosis. Moderate to severe spinal canal stenosis.   L4-L5: Disc bulge. Severe facet arthropathy. Severe right and moderate left neuroforaminal stenosis. Severe spinal canal stenosis.   L5-S1: Disc bulge. Severe bilateral facet arthropathy. Severe left and moderate right neuroforaminal  stenosis. Mild spinal canal stenosis.   IMPRESSION: 1. Moderate to severe canal stenoses at L1-L2, L2-L3, L3-L4, and L4-L5 secondary to disc bulging and facet arthropathy. 2. Moderate and severe foraminal stenosis at L4-L5 and L5-S1.     Electronically Signed   By: Clem Savory M.D.   On: 07/11/2023 13:53     Objective:  VS:  HT:    WT:   BMI:     BP:(!) 153/89  HR:76bpm  TEMP: ( )  RESP:  Physical Exam Vitals and nursing note reviewed.  Constitutional:      General: He is not in acute distress.    Appearance: Normal appearance. He is not ill-appearing.  HENT:     Head: Normocephalic and atraumatic.     Right Ear: External ear normal.     Left Ear: External ear normal.     Nose: No congestion.  Eyes:     Extraocular Movements: Extraocular movements intact.  Cardiovascular:     Rate and Rhythm: Normal rate.     Pulses: Normal pulses.  Pulmonary:     Effort: Pulmonary effort is normal. No respiratory distress.  Abdominal:     General: There is no distension.     Palpations: Abdomen is soft.  Musculoskeletal:        General: No tenderness or signs of injury.     Cervical back: Neck supple.     Right lower leg: No edema.     Left lower leg: No edema.  Comments: Patient has good distal strength without clonus.  Skin:    Findings: No erythema or rash.  Neurological:     General: No focal deficit present.     Mental Status: He is alert and oriented to person, place, and time.     Sensory: No sensory deficit.     Motor: No weakness or abnormal muscle tone.     Coordination: Coordination normal.  Psychiatric:        Mood and Affect: Mood normal.        Behavior: Behavior normal.      Imaging: No results found. "

## 2024-03-26 ENCOUNTER — Ambulatory Visit: Admitting: Internal Medicine

## 2024-04-02 ENCOUNTER — Ambulatory Visit: Admitting: Internal Medicine

## 2024-04-07 ENCOUNTER — Other Ambulatory Visit: Payer: Self-pay | Admitting: Internal Medicine

## 2024-04-09 ENCOUNTER — Other Ambulatory Visit: Payer: Self-pay

## 2024-04-12 ENCOUNTER — Ambulatory Visit: Admitting: Internal Medicine

## 2024-04-19 ENCOUNTER — Ambulatory Visit: Payer: Self-pay

## 2024-04-19 NOTE — Telephone Encounter (Signed)
 FYI Only or Action Required?: FYI only for provider: appointment scheduled on 2/9.  Patient was last seen in primary care on 09/22/2023 by Norleen Lynwood ORN, MD.  Called Nurse Triage reporting Cough.  Symptoms began 3 weeks ago.  Interventions attempted: Nothing.  Symptoms are: stable.  Triage Disposition: See Physician Within 24 Hours  Patient/caregiver understands and will follow disposition?: Yes  Reason for Triage: Patient has been experiencing headaches , congested , productive cough and coughing up discolored phlegm for 3x now.   Reason for Disposition  SEVERE coughing spells (e.g., whooping sound after coughing, vomiting after coughing)  Answer Assessment - Initial Assessment Questions 1. ONSET: When did the cough begin?      3 wks ago 2. SEVERITY: How bad is the cough today?      Having coughing fits 3. SPUTUM: Describe the color of your sputum (e.g., none, dry cough; clear, white, yellow, green)     White/ yellow 4. HEMOPTYSIS: Are you coughing up any blood? If Yes, ask: How much? (e.g., flecks, streaks, tablespoons, etc.)     no 5. DIFFICULTY BREATHING: Are you having difficulty breathing? If Yes, ask: How bad is it? (e.g., mild, moderate, severe)      Only with coughing fits 6. FEVER: Do you have a fever? If Yes, ask: What is your temperature, how was it measured, and when did it start?     No 10. OTHER SYMPTOMS: Do you have any other symptoms? (e.g., runny nose, wheezing, chest pain)       Runny nose  Protocols used: Cough - Acute Productive-A-AH

## 2024-04-23 ENCOUNTER — Ambulatory Visit: Admitting: Family Medicine

## 2024-05-21 ENCOUNTER — Ambulatory Visit

## 2024-10-15 ENCOUNTER — Encounter: Admitting: Family Medicine
